# Patient Record
Sex: Female | Born: 1970
Health system: Southern US, Community
[De-identification: ages and names within clinical notes are randomized; demographics above are authoritative.]

## PROBLEM LIST (undated history)

## (undated) DIAGNOSIS — F319 Bipolar disorder, unspecified: Secondary | ICD-10-CM

## (undated) DIAGNOSIS — F329 Major depressive disorder, single episode, unspecified: Secondary | ICD-10-CM

## (undated) DIAGNOSIS — K5792 Diverticulitis of intestine, part unspecified, without perforation or abscess without bleeding: Secondary | ICD-10-CM

## (undated) DIAGNOSIS — F32A Depression, unspecified: Secondary | ICD-10-CM

## (undated) HISTORY — DX: Major depressive disorder, single episode, unspecified: F32.9

## (undated) HISTORY — DX: Bipolar disorder, unspecified: F31.9

## (undated) HISTORY — PX: TONSILLECTOMY AND ADENOIDECTOMY: SUR1326

## (undated) HISTORY — DX: Depression, unspecified: F32.A

---

## 2003-05-03 ENCOUNTER — Other Ambulatory Visit: Admission: RE | Admit: 2003-05-03 | Discharge: 2003-05-03 | Payer: Self-pay | Admitting: Family Medicine

## 2003-08-14 ENCOUNTER — Ambulatory Visit (HOSPITAL_COMMUNITY): Admission: RE | Admit: 2003-08-14 | Discharge: 2003-08-14 | Payer: Self-pay | Admitting: Internal Medicine

## 2004-10-20 ENCOUNTER — Ambulatory Visit: Payer: Self-pay | Admitting: Gastroenterology

## 2005-06-04 ENCOUNTER — Ambulatory Visit: Payer: Self-pay | Admitting: Internal Medicine

## 2010-12-27 ENCOUNTER — Encounter
Admission: RE | Admit: 2010-12-27 | Discharge: 2010-12-27 | Payer: Self-pay | Source: Home / Self Care | Attending: Family Medicine | Admitting: Family Medicine

## 2010-12-27 ENCOUNTER — Encounter: Admission: RE | Admit: 2010-12-27 | Payer: Self-pay | Source: Home / Self Care | Admitting: Family Medicine

## 2011-01-03 ENCOUNTER — Encounter: Payer: Self-pay | Admitting: Family Medicine

## 2011-04-30 NOTE — Consult Note (Signed)
NAME:  Rachael Allen, Rachael Allen NO.:  [id number]   MEDICAL RECORD NO.:  [id number]                  PATIENT TYPE:   LOCATION:                                       FACILITY:  1971-11[facility]   PHYSICIAN:  Lionel December, M.D.                 DATE OF BIRTH:   DATE OF CONSULTATION:  07/24/2003  DATE OF DISCHARGE:                                   CONSULTATION   PHYSICIAN REQUESTING CONSULTATION:  Dr. Montey Hora.   REASON FOR CONSULTATION:  Rectal bleeding.   HISTORY OF PRESENT ILLNESS:  Rachael Allen is a 40 year old Caucasian female who has  a history of intermittent hematochezia.  She has had symptoms intermittently  since January 2004.  Tend to occur every couple of months.  She notes bright  red blood per rectum.  She also had this at the end of her pregnancy a year  and a half ago and it was felt it may be due hemorrhoids.  Two or three  weeks ago, however, she developed heavy bright red blood per rectum.  Denies  any blood clots or melena.  Bowels move regularly.  Denies any rectal pain.  She has some epigastric pressure and indigestion intermittently.  She takes  Prevacid on a p.r.n. basis.  Denies any dysphagia, odynophagia, weight loss.  Her mother has a history of colonic polyps diagnosed in her mid-40s.  No  family history of colorectal cancer to her knowledge.   CURRENT MEDICATIONS:  1. Multivitamin every day.  2. Calcium 1,000 mg every day.  3. B-6 100 mg every day.  4. Vitamin E 400 I. U. every day.   ALLERGIES:  1. LATEX.  2. NSAIDs.   PAST MEDICAL HISTORY:  Polycystic ovarian syndrome, diagnosed in 1999.   PAST SURGICAL HISTORY:  1. Cesarean section.  2. Foot surgery.  3. Ganglion cyst removal, twice.  4. Left knee arthroscopy.  5. Perirectal abscess, 1994.  6. Bilateral bone spurs.   FAMILY HISTORY:  Mother has a history of polyps diagnosed in her mid-40s.  She is a patient of Dr. Jena Gauss.   SOCIAL HISTORY:  Married for three years.  Has  one child.  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  She smoked as a  teenager.  Denies any current tobacco use or alcohol use.   REVIEW OF SYSTEMS:  Please see HPI for GI.   PHYSICAL EXAMINATION:  GENERAL:  A pleasant, well nourished, well developed  Caucasian female in no acute distress.  SKIN:  Warm and dry.  No jaundice.  HEENT:  Conjunctivae are pink.  Sclerae are nonicteric.  Oropharyngeal  mucosa is moist and pink.  No lesions, erythema or exudate.  No  lymphadenopathy, thyromegaly.  CHEST:  Lungs are clear to auscultation.  CARDIAC:  Reveals regular, rate and rhythm.  Normal S1, S2.  No murmurs,  rubs or gallops.  ABDOMEN:  Positive bowel sounds.  Soft, nontender, nondistended.  No  organomegaly or masses.  EXTREMITIES:  No edema.   IMPRESSION:  Rachael Allen is a 40 year old lady with a history of chronic  intermittent hematochezia.  This may be due to a benign anorectal source  such as hemorrhoids.  Given a recent episode of moderate to heavy bright red  blood per rectum she would like to proceed with a colonoscopy which I feel  is reasonable.  In addition, she has intermittent typical reflux symptoms  which respond to Prevacid p.r.n.   PLAN:  1. Colonoscopy in near future.  2. Prevacid 30 mg p.o. every day p.r.n., #20 samples provided.  3. Further recommendations to follow.     Tana Coast, P.A.                        Lionel December, M.D.    LL/MEDQ  D:  07/24/2003  T:  07/24/2003  Job:  [id number]   cc:   Magnus Sinning. Dimple Casey, M.D.  [address] [place]  [place] [address]  Fax: [phone]

## 2011-04-30 NOTE — Op Note (Signed)
NAME:  Rachael Allen, Rachael Allen                         ACCOUNT NO.:  [id number]   MEDICAL RECORD NO.:  [id number]                   PATIENT TYPE:  AMB   LOCATION:  DAY                                  FACILITY:  [facility]   PHYSICIAN:  Lionel December, M.D.                 DATE OF BIRTH:  1971-05-08   DATE OF PROCEDURE:  08/14/2003  DATE OF DISCHARGE:                                 OPERATIVE REPORT   PROCEDURE:  Total colonoscopy.   INDICATIONS:  Rachael Allen is a 40 year old Caucasian female who has had  intermittent hematochezia felt to be secondary to hemorrhoids, but a few  weeks ago she had an episode where she had spontaneous passage of bright red  blood per rectum described to be moderate to large.  She is therefore  undergoing a diagnostic colonoscopy.  She denies diarrhea, rectal bleeding,  or abdominal pain but does get intermittent bloating.  She does not take any  NSAIDs on a regular basis.   The procedure risks were reviewed with the patient and informed consent was  obtained.   PREMEDICATION:  Demerol 25 mg IV, Versed 5 mg IV in divided dose.   FINDINGS:  Procedure performed in endoscopy suite.  The patient's vital  signs and O2 saturation were monitored during the procedure and remained  stable.  The patient was placed in the left lateral recumbent position and  rectal examination performed.  No abnormality noted on external or digital  exam.  Olympus video scope was placed in the rectum and advanced under  vision to the sigmoid colon and beyond.  Preparation was excellent.  The  scope was passed into cecum, which was identified by ileocecal valve and  appendiceal orifice.  Pictures taken for the record.  A short segment of TI  was also examined.  It had areas where there was loss of villi.  Pictures  taken for the record followed by biopsy for routine histology.  As the scope  was withdrawn, colonic mucosa was once again carefully examined and was  normal throughout.  Rectal mucosa  similarly was normal.  The scope was  retroflexed to examine the anorectal junction, and small hemorrhoids were  noted below the dentate line.  The endoscope was straightened and withdrawn.  The patient tolerated the procedure well.   FINAL DIAGNOSIS:  1. Normal colonoscopy except small external hemorrhoids, possibly source of     her rectal bleeding.  2. Abnormal patch of terminal ileal mucosa with loss of villi.  This is     possibly insignificant, biopsy taken to make sure she does not have     granulomas, etc.   RECOMMENDATIONS:  1. She will resume her usual diet.  2.     I will be contacting the patient with biopsy results.  3. I would like for her to keep a written record as to the frequency of  bleeding episodes and if this is too frequent, we may consider flexible     sigmoidoscopy immediately following one of these episodes.                                               Lionel December, M.D.    NR/MEDQ  D:  08/14/2003  T:  08/14/2003  Job:  [id number]   cc:   Magnus Sinning. Dimple Casey, M.D.  [address] [place]  [place] [address]  Fax: [phone]

## 2013-01-08 DIAGNOSIS — R0789 Other chest pain: Secondary | ICD-10-CM | POA: Insufficient documentation

## 2013-01-08 DIAGNOSIS — N6019 Diffuse cystic mastopathy of unspecified breast: Secondary | ICD-10-CM | POA: Insufficient documentation

## 2013-03-07 ENCOUNTER — Ambulatory Visit (INDEPENDENT_AMBULATORY_CARE_PROVIDER_SITE_OTHER): Payer: Managed Care, Other (non HMO) | Admitting: Nurse Practitioner

## 2013-03-07 ENCOUNTER — Encounter: Payer: Self-pay | Admitting: Nurse Practitioner

## 2013-03-07 ENCOUNTER — Encounter: Payer: Self-pay | Admitting: *Deleted

## 2013-03-07 VITALS — BP 135/96 | HR 98 | Temp 98.2°F | Ht 65.0 in | Wt 263.0 lb

## 2013-03-07 DIAGNOSIS — E282 Polycystic ovarian syndrome: Secondary | ICD-10-CM | POA: Insufficient documentation

## 2013-03-07 DIAGNOSIS — IMO0001 Reserved for inherently not codable concepts without codable children: Secondary | ICD-10-CM

## 2013-03-07 DIAGNOSIS — M797 Fibromyalgia: Secondary | ICD-10-CM

## 2013-03-07 DIAGNOSIS — Z01419 Encounter for gynecological examination (general) (routine) without abnormal findings: Secondary | ICD-10-CM

## 2013-03-07 LAB — CBC WITH DIFFERENTIAL/PLATELET
Basophils Absolute: 0 10*3/uL (ref 0.0–0.1)
Basophils Relative: 1 % (ref 0–1)
HCT: 39.9 % (ref 36.0–46.0)
Lymphocytes Relative: 27 % (ref 12–46)
MCHC: 35.1 g/dL (ref 30.0–36.0)
MCV: 81.1 fL (ref 78.0–100.0)
Neutro Abs: 4 10*3/uL (ref 1.7–7.7)
Neutrophils Relative %: 62 % (ref 43–77)
Platelets: 274 10*3/uL (ref 150–400)

## 2013-03-07 LAB — COMPLETE METABOLIC PANEL WITH GFR
AST: 15 U/L (ref 0–37)
Albumin: 4.2 g/dL (ref 3.5–5.2)
Alkaline Phosphatase: 52 U/L (ref 39–117)
BUN: 10 mg/dL (ref 6–23)
CO2: 26 mEq/L (ref 19–32)
GFR, Est African American: >89 mL/min
Glucose, Bld: 98 mg/dL (ref 70–99)
Sodium: 138 mEq/L (ref 135–145)
Total Bilirubin: 0.4 mg/dL (ref 0.3–1.2)

## 2013-03-07 LAB — POCT URINALYSIS DIPSTICK
Glucose, UA: NEGATIVE
Protein, UA: NEGATIVE
pH, UA: 6.5

## 2013-03-07 LAB — POCT UA - MICROSCOPIC ONLY
Bacteria, U Microscopic: NEGATIVE
Crystals, Ur, HPF, POC: NEGATIVE
Mucus, UA: NEGATIVE
Yeast, UA: NEGATIVE

## 2013-03-07 LAB — THYROID PANEL WITH TSH
Free Thyroxine Index: 2.8 (ref 1.0–3.9)
TSH: 0.771 u[IU]/mL (ref 0.350–4.500)

## 2013-03-07 NOTE — Progress Notes (Signed)
  Subjective:    Patient ID: Rachael Allen, female    DOB: 1971/05/09, 42 y.o.   MRN: [id number]  Gynecologic Exam The patient's primary symptoms include a genital odor, pelvic pain (cramps intermittently.) and a vaginal discharge (just feels wet all the time). This is a chronic problem. The current episode started more than 1 year ago. The problem occurs constantly. The problem has been gradually worsening. The pain is moderate. The problem affects both sides. She is not pregnant. Associated symptoms include abdominal pain (pelvic pain bil), back pain (chronic) and painful intercourse (occassionally). Pertinent negatives include no constipation, diarrhea, dysuria, flank pain or nausea. The vaginal discharge was clear and watery. The vaginal bleeding is spotting (Has Mirenia). She has not been passing clots. She has not been passing tissue. Nothing aggravates the symptoms. She has tried NSAIDs for the symptoms. The treatment provided moderate relief. She is sexually active. No, her partner does not have an STD. She uses an IUD for contraception. Menstrual history: spotty but no regular menses due to mirena  Her past medical history is significant for a Cesarean section (X2).      Review of Systems  Constitutional: Negative.   HENT: Negative.   Eyes: Negative.   Respiratory: Negative.   Cardiovascular: Negative.   Gastrointestinal: Positive for abdominal pain (pelvic pain bil). Negative for nausea, diarrhea and constipation.  Endocrine: Negative.   Genitourinary: Positive for vaginal discharge (just feels wet all the time) and pelvic pain (cramps intermittently.). Negative for dysuria and flank pain.  Musculoskeletal: Positive for back pain (chronic).  Skin: Negative.   Neurological: Negative.   Hematological: Negative.   Psychiatric/Behavioral: Negative.        Objective:   Physical Exam  Constitutional: She is oriented to person, place, and time. She appears well-developed and  well-nourished.  HENT:  Head: Normocephalic.  Nose: Nose normal.  Mouth/Throat: Oropharynx is clear and moist.  Eyes: Conjunctivae and EOM are normal. Pupils are equal, round, and reactive to light.  Neck: Normal range of motion. Neck supple. No JVD present. Carotid bruit is not present.  Cardiovascular: Normal rate, regular rhythm, normal heart sounds and intact distal pulses.  Exam reveals no gallop and no friction rub.   No murmur heard. Pulmonary/Chest: Effort normal. She has no wheezes. She has rales.  Abdominal: Soft. Bowel sounds are normal. She exhibits no mass. There is no tenderness. There is no rebound.  Genitourinary: Vagina normal and uterus normal.  Cervix nonparous and pink. No discharge noted.Mirenia string visible  Musculoskeletal: Normal range of motion.  All over pain from fibromyalgia  Neurological: She is alert and oriented to person, place, and time. She has normal reflexes.  Skin: Skin is warm and dry.  Psychiatric: She has a normal mood and affect. Her behavior is normal. Judgment and thought content normal.   BP 135/96  Pulse 98  Temp(Src) 98.2 F (36.8 C) (Oral)  Ht 5\' 5"  (1.651 m)  Wt 263 lb (119.296 kg)  BMI 43.77 kg/m2        Assessment & Plan:  CPE Labs pending  Pap pending Watch diet  Encourage exercise  Mary-Margaret Daphine Deutscher, FNP

## 2013-03-07 NOTE — Patient Instructions (Signed)

## 2013-03-08 LAB — PAP IG W/ RFLX HPV ASCU

## 2013-03-09 ENCOUNTER — Telehealth: Payer: Self-pay | Admitting: Nurse Practitioner

## 2013-03-09 LAB — NMR LIPOPROFILE WITH LIPIDS
Cholesterol, Total: 150 mg/dL (ref <200)
HDL Particle Number: 26.6 umol/L — ABNORMAL LOW (ref >=30.5)
HDL-C: 35 mg/dL — ABNORMAL LOW (ref >=40)
LDL (calc): 82 mg/dL (ref <100)
LDL Particle Number: 1140 nmol/L — ABNORMAL HIGH (ref <1000)
LDL Size: 20.6 nm (ref >20.5)
Large HDL-P: 1.8 umol/L — ABNORMAL LOW (ref >=4.8)
Triglycerides: 167 mg/dL — ABNORMAL HIGH (ref <150)

## 2013-03-09 NOTE — Telephone Encounter (Signed)
Patient aware of labs.  

## 2013-03-09 NOTE — Progress Notes (Signed)
Patient aware of labs.  

## 2013-03-15 ENCOUNTER — Telehealth: Payer: Self-pay | Admitting: Nurse Practitioner

## 2013-03-27 ENCOUNTER — Telehealth: Payer: Self-pay | Admitting: Nurse Practitioner

## 2013-03-27 ENCOUNTER — Other Ambulatory Visit: Payer: Self-pay | Admitting: Nurse Practitioner

## 2013-03-27 DIAGNOSIS — M797 Fibromyalgia: Secondary | ICD-10-CM

## 2013-03-27 NOTE — Telephone Encounter (Signed)
Needs pain clinic referral. Where would like to go

## 2013-03-27 NOTE — Telephone Encounter (Signed)
Patient called and canceled appoint said she would call back and make appointment

## 2013-03-27 NOTE — Telephone Encounter (Signed)
Patient does not care where she goes

## 2013-03-27 NOTE — Telephone Encounter (Signed)
Patient states that she is still having pain. The cymbalta that you put her on helps but she wants to know if she needs to come back and see you or her RA doctor? Please advise

## 2013-03-28 NOTE — Progress Notes (Signed)
Patient aware.

## 2013-04-03 ENCOUNTER — Telehealth: Payer: Self-pay | Admitting: Nurse Practitioner

## 2013-04-03 NOTE — Telephone Encounter (Signed)
I need to know exactly what letter needs to say. WH at is she going to court for.

## 2013-04-04 ENCOUNTER — Telehealth: Payer: Self-pay | Admitting: *Deleted

## 2013-04-04 DIAGNOSIS — E282 Polycystic ovarian syndrome: Secondary | ICD-10-CM

## 2013-04-04 NOTE — Telephone Encounter (Signed)
Referral made 

## 2013-04-04 NOTE — Telephone Encounter (Signed)
Pt aware and appt made.

## 2013-04-04 NOTE — Telephone Encounter (Signed)
Pt aware.

## 2013-04-04 NOTE — Telephone Encounter (Signed)
Can we please referral to endo for PCOS.

## 2013-04-04 NOTE — Telephone Encounter (Signed)
States she has to go to a social security hearing where she has been out of work Clinical research associate said she needs a doctors note saying what all has been going on with her for her mental and physical issue of why she has not been able to go to work so she can get disability

## 2013-04-04 NOTE — Telephone Encounter (Signed)
Patient will NT[employer] to get letter so we can make sure it is correct.

## 2013-04-10 ENCOUNTER — Encounter: Payer: Self-pay | Admitting: Physical Medicine & Rehabilitation

## 2013-04-12 ENCOUNTER — Telehealth: Payer: Self-pay | Admitting: Nurse Practitioner

## 2013-04-12 NOTE — Telephone Encounter (Signed)
Please advise 

## 2013-04-12 NOTE — Telephone Encounter (Signed)
What does she want sheis allergic to Vicodin.

## 2013-04-13 NOTE — Telephone Encounter (Signed)
lmtcb alf 04/13/13

## 2013-04-13 NOTE — Telephone Encounter (Signed)
Tell patient ot come in early Saturday morning and we can give her a shot

## 2013-04-13 NOTE — Telephone Encounter (Signed)
Patient reports a headache x 4 days.  Has also developed some pain in her neck.  Has hx of migraines but headaches don't typically last this long.  They have not been increasing in intensity but have not improved either.  She doesn't have a medication preference for treatment and uses [facility].    She is aware of our Saturday clinic hours and that we have someone available on call at night and on the weekend.  She will f/u with Paulene Floor, FNP next week if pain hasn't resolved or worsens.

## 2013-04-19 ENCOUNTER — Ambulatory Visit (INDEPENDENT_AMBULATORY_CARE_PROVIDER_SITE_OTHER): Payer: Managed Care, Other (non HMO) | Admitting: Nurse Practitioner

## 2013-04-19 ENCOUNTER — Encounter: Payer: Self-pay | Admitting: Nurse Practitioner

## 2013-04-19 VITALS — BP 112/76 | HR 70 | Temp 97.5°F | Ht 65.5 in

## 2013-04-19 DIAGNOSIS — G43909 Migraine, unspecified, not intractable, without status migrainosus: Secondary | ICD-10-CM

## 2013-04-19 DIAGNOSIS — R42 Dizziness and giddiness: Secondary | ICD-10-CM

## 2013-04-19 MED ORDER — BUTALBITAL-APAP-CAFF-COD 50-300-40-30 MG PO CAPS: 1.0000 | ORAL_CAPSULE | ORAL | Status: DC | PRN

## 2013-04-19 MED ORDER — MECLIZINE HCL 25 MG PO TABS: 25.0000 mg | ORAL_TABLET | Freq: Three times a day (TID) | ORAL | Status: DC | PRN

## 2013-04-19 NOTE — Patient Instructions (Signed)
Migraine Headache A migraine headache is an intense, throbbing pain on one or both sides of your head. A migraine can last for 30 minutes to several hours. CAUSES  The exact cause of a migraine headache is not always known. However, a migraine may be caused when nerves in the brain become irritated and release chemicals that cause inflammation. This causes pain. SYMPTOMS  Pain on one or both sides of your head.  Pulsating or throbbing pain.  Severe pain that prevents daily activities.  Pain that is aggravated by any physical activity.  Nausea, vomiting, or both.  Dizziness.  Pain with exposure to bright lights, loud noises, or activity.  General sensitivity to bright lights, loud noises, or smells. Before you get a migraine, you may get warning signs that a migraine is coming (aura). An aura may include:  Seeing flashing lights.  Seeing bright spots, halos, or zig-zag lines.  Having tunnel vision or blurred vision.  Having feelings of numbness or tingling.  Having trouble talking.  Having muscle weakness. MIGRAINE TRIGGERS  Alcohol.  Smoking.  Stress.  Menstruation.  Aged cheeses.  Foods or drinks that contain nitrates, glutamate, aspartame, or tyramine.  Lack of sleep.  Chocolate.  Caffeine.  Hunger.  Physical exertion.  Fatigue.  Medicines used to treat chest pain (nitroglycerine), birth control pills, estrogen, and some blood pressure medicines. DIAGNOSIS  A migraine headache is often diagnosed based on:  Symptoms.  Physical examination.  A CT scan or MRI of your head. TREATMENT Medicines may be given for pain and nausea. Medicines can also be given to help prevent recurrent migraines.  HOME CARE INSTRUCTIONS  Only take over-the-counter or prescription medicines for pain or discomfort as directed by your caregiver. The use of long-term narcotics is not recommended.  Lie down in a dark, quiet room when you have a migraine.  Keep a journal  to find out what may trigger your migraine headaches. For example, write down:  What you eat and drink.  How much sleep you get.  Any change to your diet or medicines.  Limit alcohol consumption.  Quit smoking if you smoke.  Get 7 to 9 hours of sleep, or as recommended by your caregiver.  Limit stress.  Keep lights dim if bright lights bother you and make your migraines worse. SEEK IMMEDIATE MEDICAL CARE IF:   Your migraine becomes severe.  You have a fever.  You have a stiff neck.  You have vision loss.  You have muscular weakness or loss of muscle control.  You start losing your balance or have trouble walking.  You feel faint or pass out.  You have severe symptoms that are different from your first symptoms. MAKE SURE YOU:   Understand these instructions.  Will watch your condition.  Will get help right away if you are not doing well or get worse. Document Released: 11/29/2005 Document Revised: 02/21/2012 Document Reviewed: 11/19/2011 ExitCare Patient Information 2013 [employer].  

## 2013-04-19 NOTE — Progress Notes (Signed)
  Subjective:    Patient ID: Rachael Allen, female    DOB: [birth year], 42 y.o.   MRN: [id number]  HPI- Patient in C/O migraine that lasted all last week. Finally has resolved now. Nothing seemed to help it. Now having a little dizziness. Occurs when rolling over in bed and sometimes when standing, but only last maybe 10-15 seconds. It does occur several times a day.    Review of Systems  Gastrointestinal: Positive for nausea.  Neurological: Positive for dizziness, numbness and headaches.       Objective:   Physical Exam  Constitutional: She is oriented to person, place, and time. She appears well-developed and well-nourished.  Cardiovascular: Normal rate and normal heart sounds.   Pulmonary/Chest: Effort normal and breath sounds normal.  Neurological: She is alert and oriented to person, place, and time. She has normal reflexes. No cranial nerve deficit.  BP 112/76  Pulse 70  Temp(Src) 97.5 F (36.4 C) (Oral)  Ht 5' 5.5" (1.664 m)         Assessment & Plan:  1. Migraines Rest in a dark room COOL COMPRESSES TO HEAD - Butalbital-APAP-Caff-Cod (FIORICET/CODEINE) 50-300-40-30 MG CAPS; Take 1 capsule by mouth as needed.  Dispense: 30 capsule; Refill: 1  2. Dizziness force fluids - meclizine (ANTIVERT) 25 MG tablet; Take 1 tablet (25 mg total) by mouth 3 (three) times daily as needed.  Dispense: 30 tablet; Refill: 0   Mary-Margaret Daphine Deutscher, FNP

## 2013-04-27 ENCOUNTER — Encounter: Payer: Medicare HMO | Admitting: Physical Medicine & Rehabilitation

## 2013-05-02 ENCOUNTER — Ambulatory Visit (INDEPENDENT_AMBULATORY_CARE_PROVIDER_SITE_OTHER): Payer: Managed Care, Other (non HMO) | Admitting: Nurse Practitioner

## 2013-05-02 ENCOUNTER — Encounter: Payer: Self-pay | Admitting: Nurse Practitioner

## 2013-05-02 VITALS — BP 123/75 | HR 68 | Temp 98.2°F | Ht 64.5 in | Wt 276.0 lb

## 2013-05-02 DIAGNOSIS — J029 Acute pharyngitis, unspecified: Secondary | ICD-10-CM

## 2013-05-02 NOTE — Patient Instructions (Signed)
Pain of Unknown Etiology (Pain Without a Known Cause) °You have come to your caregiver because of pain. Pain can occur in any part of the body. Often there is not a definite cause. If your laboratory (blood or urine) work was normal and x-rays or other studies were normal, your caregiver may treat you without knowing the cause of the pain. An example of this is the headache. Most headaches are diagnosed by taking a history. This means your caregiver asks you questions about your headaches. Your caregiver determines a treatment based on your answers. Usually testing done for headaches is normal. Often testing is not done unless there is no response to medications. Regardless of where your pain is located today, you can be given medications to make you comfortable. If no physical cause of pain can be found, most cases of pain will gradually leave as suddenly as they came.  °If you have a painful condition and no reason can be found for the pain, It is importantthat you follow up with your caregiver. If the pain becomes worse or does not go away, it may be necessary to repeat tests and look further for a possible cause. °· Only take over-the-counter or prescription medicines for pain, discomfort, or fever as directed by your caregiver. °· For the protection of your privacy, test results can not be given over the phone. Make sure you receive the results of your test. Ask as to how these results are to be obtained if you have not been informed. It is your responsibility to obtain your test results. °· You may continue all activities unless the activities cause more pain. When the pain lessens, it is important to gradually resume normal activities. Resume activities by beginning slowly and gradually increasing the intensity and duration of the activities or exercise. During periods of severe pain, bed-rest may be helpful. Lay or sit in any position that is comfortable. °· Ice used for acute (sudden) conditions may be  effective. Use a large plastic bag filled with ice and wrapped in a towel. This may provide pain relief. °· See your caregiver for continued problems. They can help or refer you for exercises or physical therapy if necessary. °If you were given medications for your condition, do not drive, operate machinery or power tools, or sign legal documents for 24 hours. Do not drink alcohol, take sleeping pills, or take other medications that may interfere with treatment. °See your caregiver immediately if you have pain that is becoming worse and not relieved by medications. °Document Released: 08/24/2001 Document Revised: 02/21/2012 Document Reviewed: 11/29/2005 °ExitCare® Patient Information ©2014 [employer]. ° °

## 2013-05-02 NOTE — Progress Notes (Signed)
  Subjective:    Patient ID: Rachael Allen, female    DOB: [birth year], 42 y.o.   MRN: [id number]   HPI Patient in today to discuss infromation for letter that needs written for disability- Patient was diagnosed with Fibromyalgia and PCOS. Her functional capacity is as follows: -Can't stand for more than 30 minutes ata time -Walking causes her feet to cramp and feet go numb -Cant sit for long periods of time because she gets stiff - Can't lay for long periods of time- gets up at least 3-4 X a night -Hands going numb when she does any tasks -Hands and feet swell almost everyday -memory problems -patient has to write everything down -Depression due to constant pain and feeling bad all the time -unable to play with her kids  * C/O congestion and sore throat today  Review of Systems  Constitutional: Positive for appetite change and fatigue. Negative for fever and chills.  HENT: Positive for congestion, sore throat and rhinorrhea. Negative for ear pain.   Respiratory: Negative for chest tightness.   Cardiovascular: Negative for chest pain, palpitations and leg swelling.  Psychiatric/Behavioral: Positive for dysphoric mood and agitation.       Objective:   Physical Exam  Constitutional: She is oriented to person, place, and time. She appears well-developed and well-nourished.  HENT:  Right Ear: Hearing, tympanic membrane, external ear and ear canal normal.  Left Ear: Hearing, tympanic membrane, external ear and ear canal normal.  Nose: Mucosal edema and rhinorrhea present. Right sinus exhibits no maxillary sinus tenderness and no frontal sinus tenderness. Left sinus exhibits no maxillary sinus tenderness and no frontal sinus tenderness.  Mouth/Throat: Posterior oropharyngeal erythema present.  Eyes: EOM are normal. Pupils are equal, round, and reactive to light.  Neck: Normal range of motion.  Cardiovascular: Normal rate and normal heart sounds.   Pulmonary/Chest: Effort normal and  breath sounds normal.  Lymphadenopathy:    She has no cervical adenopathy.  Neurological: She is alert and oriented to person, place, and time.  Skin: Skin is warm.  Psychiatric: She has a normal mood and affect. Her behavior is normal. Judgment and thought content normal.  Tearful during exam   BP 123/75  Pulse 68  Temp(Src) 98.2 F (36.8 C) (Oral)  Ht 5' 4.5" (1.638 m)  Wt 276 lb (125.193 kg)  BMI 46.66 kg/m2 Results for orders placed in visit on 05/02/13  POCT RAPID STREP A (OFFICE)      Result Value Range   Rapid Strep A Screen Negative  Negative          Assessment & Plan:  1. Sore throat Force fluids OTC decongestant Motrin or tylenol OTC prn - POCT rapid strep A  Fibromyalgia/PCOS/Bipolar/Depression  gathered information for disability letter that needs to be written Will call patient when letter is ready  Mary-Margaret Daphine Deutscher, FNP

## 2013-05-08 DIAGNOSIS — R0602 Shortness of breath: Secondary | ICD-10-CM | POA: Insufficient documentation

## 2013-05-10 ENCOUNTER — Telehealth: Payer: Self-pay | Admitting: Nurse Practitioner

## 2013-05-10 NOTE — Telephone Encounter (Signed)
Pull chart for me please- My notes are in chart- thensend message back to remind me to do note

## 2013-05-11 ENCOUNTER — Encounter: Payer: Self-pay | Admitting: Nurse Practitioner

## 2013-05-11 NOTE — Telephone Encounter (Signed)
Letter ready for pick up

## 2013-05-11 NOTE — Telephone Encounter (Signed)
Patient aware.

## 2013-05-11 NOTE — Telephone Encounter (Signed)
Pulling chart and will put on your desk

## 2013-09-12 ENCOUNTER — Telehealth: Payer: Self-pay | Admitting: *Deleted

## 2013-09-12 NOTE — Telephone Encounter (Signed)
PT ASKED FOR ADDRESS TO NERVE STUDY TESTING. PT CALLED [phone]. I CALLED PT 1154AM, [facility] [address], [place] PHONE [phone].

## 2013-10-10 ENCOUNTER — Other Ambulatory Visit: Payer: Self-pay | Admitting: Nurse Practitioner

## 2013-10-11 NOTE — Telephone Encounter (Signed)
Last seen 05/02/13  MMM   

## 2013-10-11 NOTE — Telephone Encounter (Signed)
Last seen 05/08/13  MMM

## 2013-11-20 ENCOUNTER — Ambulatory Visit: Payer: Managed Care, Other (non HMO) | Admitting: Family Medicine

## 2013-11-21 ENCOUNTER — Ambulatory Visit (INDEPENDENT_AMBULATORY_CARE_PROVIDER_SITE_OTHER): Payer: Managed Care, Other (non HMO) | Admitting: Family Medicine

## 2013-11-21 ENCOUNTER — Encounter: Payer: Self-pay | Admitting: Family Medicine

## 2013-11-21 ENCOUNTER — Encounter (INDEPENDENT_AMBULATORY_CARE_PROVIDER_SITE_OTHER): Payer: Self-pay

## 2013-11-21 VITALS — BP 138/77 | HR 61 | Temp 98.5°F | Ht 64.5 in | Wt 277.0 lb

## 2013-11-21 DIAGNOSIS — R062 Wheezing: Secondary | ICD-10-CM

## 2013-11-21 DIAGNOSIS — J069 Acute upper respiratory infection, unspecified: Secondary | ICD-10-CM

## 2013-11-21 DIAGNOSIS — J40 Bronchitis, not specified as acute or chronic: Secondary | ICD-10-CM

## 2013-11-21 MED ORDER — BENZONATATE 100 MG PO CAPS: 100.0000 mg | ORAL_CAPSULE | Freq: Three times a day (TID) | ORAL | Status: DC | PRN

## 2013-11-21 MED ORDER — METHYLPREDNISOLONE ACETATE 40 MG/ML IJ SUSP: 80.0000 mg | Freq: Once | INTRAMUSCULAR | Status: DC

## 2013-11-21 MED ORDER — AZITHROMYCIN 250 MG PO TABS: ORAL_TABLET | ORAL | Status: DC

## 2013-11-21 MED ORDER — METHYLPREDNISOLONE ACETATE 80 MG/ML IJ SUSP
80.0000 mg | Freq: Once | INTRAMUSCULAR | Status: AC
  Administered 2013-11-21: 80 mg via INTRAMUSCULAR

## 2013-11-21 NOTE — Patient Instructions (Signed)

## 2013-11-21 NOTE — Progress Notes (Signed)
   Subjective:    Patient ID: Rachael Allen, female    DOB: [birth year], 42 y.o.   MRN: [id number]  HPI  URI Symptoms Onset: 2 weeks  Description: cough, mild wheezing, rhinorrhea  Modifying factors:  Prior inhaler use, though no formal dx of asthma   Symptoms Nasal discharge: yes Fever: no Sore throat: no Cough: yes Wheezing: yes Ear pain: no GI symptoms: no Sick contacts: yes  Red Flags  Stiff neck: no Dyspnea: no Rash: no Swallowing difficulty: no  Sinusitis Risk Factors Headache/face pain: no Double sickening: no tooth pain: no  Allergy Risk Factors Sneezing: no Itchy scratchy throat: no Seasonal symptoms: no  Flu Risk Factors Headache: no muscle aches: no severe fatigue: no     Review of Systems  All other systems reviewed and are negative.       Objective:   Physical Exam  Constitutional: She is oriented to person, place, and time.  Obese    HENT:  Head: Normocephalic and atraumatic.  Right Ear: External ear normal.  Left Ear: External ear normal.  +nasal erythema, rhinorrhea bilaterally, + post oropharyngeal erythema    Eyes: Conjunctivae are normal. Pupils are equal, round, and reactive to light.  Neck: Normal range of motion.  Cardiovascular: Normal rate and regular rhythm.   Pulmonary/Chest: Effort normal and breath sounds normal.  Faint wheezes in apices    Abdominal: Soft.  Musculoskeletal: Normal range of motion.  Neurological: She is alert and oriented to person, place, and time.  Skin: Skin is warm.          Assessment & Plan:  Bronchitis - Plan: methylPREDNISolone acetate (DEPO-MEDROL) injection 80 mg, azithromycin (ZITHROMAX) 250 MG tablet, benzonatate (TESSALON) 100 MG capsule  Suspect likely viral/post viral source of sxs.  Depomedrol 80mg  IM x1 given wheezing  zpak for atpyical coverage given duration of sxs.  Tessalon perles for cough  Discussed general and infectious/resp red flags.  Follow up as needed.

## 2013-11-21 NOTE — Addendum Note (Signed)
Addended by: Gwenith Daily on: 11/21/2013 01:49 PM   Modules accepted: Orders

## 2013-11-28 ENCOUNTER — Other Ambulatory Visit: Payer: Self-pay

## 2013-11-28 MED ORDER — FLUCONAZOLE 150 MG PO TABS: 150.0000 mg | ORAL_TABLET | Freq: Once | ORAL | Status: DC

## 2013-11-28 NOTE — Telephone Encounter (Signed)
Last seen 11/21/13  Dr Alvester Morin

## 2014-02-28 ENCOUNTER — Telehealth: Payer: Self-pay | Admitting: *Deleted

## 2014-02-28 NOTE — Telephone Encounter (Signed)
I saw Dr. Milinda Pointer back in June.  I need the number to [facility] where he sent me for nerve study.  Leave the number on my voicemail.  I returned her call and gave her the phone number.  [phone] [facility]

## 2014-04-04 DIAGNOSIS — Z975 Presence of (intrauterine) contraceptive device: Secondary | ICD-10-CM | POA: Insufficient documentation

## 2014-04-11 ENCOUNTER — Encounter: Payer: Self-pay | Admitting: *Deleted

## 2014-05-09 ENCOUNTER — Ambulatory Visit (INDEPENDENT_AMBULATORY_CARE_PROVIDER_SITE_OTHER): Payer: BC Managed Care – PPO | Admitting: Nurse Practitioner

## 2014-05-09 ENCOUNTER — Encounter: Payer: Self-pay | Admitting: Nurse Practitioner

## 2014-05-09 VITALS — BP 125/84 | HR 64 | Temp 98.6°F | Ht 64.0 in | Wt 278.0 lb

## 2014-05-09 DIAGNOSIS — IMO0001 Reserved for inherently not codable concepts without codable children: Secondary | ICD-10-CM

## 2014-05-09 DIAGNOSIS — Z713 Dietary counseling and surveillance: Secondary | ICD-10-CM

## 2014-05-09 DIAGNOSIS — F32A Depression, unspecified: Secondary | ICD-10-CM

## 2014-05-09 DIAGNOSIS — F329 Major depressive disorder, single episode, unspecified: Secondary | ICD-10-CM | POA: Insufficient documentation

## 2014-05-09 DIAGNOSIS — G47419 Narcolepsy without cataplexy: Secondary | ICD-10-CM

## 2014-05-09 DIAGNOSIS — E282 Polycystic ovarian syndrome: Secondary | ICD-10-CM

## 2014-05-09 DIAGNOSIS — Z6841 Body Mass Index (BMI) 40.0 and over, adult: Secondary | ICD-10-CM

## 2014-05-09 DIAGNOSIS — F3289 Other specified depressive episodes: Secondary | ICD-10-CM

## 2014-05-09 DIAGNOSIS — F320 Major depressive disorder, single episode, mild: Secondary | ICD-10-CM | POA: Insufficient documentation

## 2014-05-09 DIAGNOSIS — G43909 Migraine, unspecified, not intractable, without status migrainosus: Secondary | ICD-10-CM

## 2014-05-09 DIAGNOSIS — M797 Fibromyalgia: Secondary | ICD-10-CM

## 2014-05-09 NOTE — Progress Notes (Signed)
   Subjective:    Patient ID: [name], female    DOB: 1971/12/05, 43 y.o.   MRN: [id number]  HPI Patient here today for follow up of chronic medical problems: -Fibromyalgia Currently not taking anything - tries to exercise- has tried cymbalta but that caused her to itch so she had to stop taking. Ha [place] daily -Depression On effexor which works well to keep emotions under control -Narcolepsy Currently on nuvigil which does well to keep her from sleeping all the time.   Review of Systems  Constitutional: Negative.   HENT: Negative.   Respiratory: Negative.   Cardiovascular: Negative.   Musculoskeletal: Positive for myalgias.  Neurological: Negative.   Psychiatric/Behavioral: Negative.   All other systems reviewed and are negative.      Objective:   Physical Exam  Constitutional: She is oriented to person, place, and time. She appears well-developed and well-nourished.  HENT:  Nose: Nose normal.  Mouth/Throat: Oropharynx is clear and moist.  Eyes: EOM are normal.  Neck: Trachea normal, normal range of motion and full passive range of motion without pain. Neck supple. No JVD present. Carotid bruit is not present. No thyromegaly present.  Cardiovascular: Normal rate, regular rhythm, normal heart sounds and intact distal pulses.  Exam reveals no gallop and no friction rub.   No murmur heard. Pulmonary/Chest: Effort normal and breath sounds normal.  Abdominal: Soft. Bowel sounds are normal. She exhibits no distension and no mass. There is no tenderness.  Musculoskeletal: Normal range of motion.  Point tenderness all up and down back  Lymphadenopathy:    She has no cervical adenopathy.  Neurological: She is alert and oriented to person, place, and time. She has normal reflexes.  Skin: Skin is warm and dry.  Psychiatric: She has a normal mood and affect. Her behavior is normal. Judgment and thought content normal.    BP 125/84  Pulse 64  Temp(Src) 98.6 F (37 C)  (Oral)  Ht $R'5\' 4"'uy$  (1.626 m)  Wt 278 lb (126.1 kg)  BMI 47.70 kg/m2       Assessment & Plan:   1. Fibromyalgia muscle pain   2. Migraines   3. PCOS (polycystic ovarian syndrome)   4. Depression   5. Narcolepsy   6. BMI 45.0-49.9, adult   7. Weight loss counseling, encounter for    Orders Placed This Encounter  Procedures  . CMP14+EGFR  . NMR, lipoprofile   Meds ordered this encounter  Medications  . pramipexole (MIRAPEX) 0.125 MG tablet    Sig: Take 0.125 mg by mouth daily.    Labs pending Health maintenance reviewed Diet and exercise encouraged- walking will help weight as well as fibromyalgia Continue all meds Follow up  In 6 months   [place], FNP

## 2014-05-09 NOTE — Patient Instructions (Signed)

## 2014-05-10 ENCOUNTER — Ambulatory Visit: Payer: Managed Care, Other (non HMO) | Admitting: Nurse Practitioner

## 2014-05-10 LAB — CMP14+EGFR
A/G RATIO: 2 (ref 1.1–2.5)
ALK PHOS: 59 IU/L (ref 39–117)
ALT: 30 IU/L (ref 0–32)
AST: 19 IU/L (ref 0–40)
Albumin: 4.3 g/dL (ref 3.5–5.5)
BILIRUBIN TOTAL: 0.3 mg/dL (ref 0.0–1.2)
BUN / CREAT RATIO: 15 (ref 9–23)
BUN: 10 mg/dL (ref 6–24)
CHLORIDE: 101 mmol/L (ref 97–108)
CO2: 25 mmol/L (ref 18–29)
CREATININE: 0.68 mg/dL (ref 0.57–1.00)
Calcium: 9.4 mg/dL (ref 8.7–10.2)
GFR calc non Af Amer: 107 mL/min/{1.73_m2} (ref >59)
GFR, EST AFRICAN AMERICAN: 124 mL/min/{1.73_m2} (ref >59)
Globulin, Total: 2.2 g/dL (ref 1.5–4.5)
Glucose: 105 mg/dL — ABNORMAL HIGH (ref 65–99)
Potassium: 4.4 mmol/L (ref 3.5–5.2)
Sodium: 141 mmol/L (ref 134–144)
Total Protein: 6.5 g/dL (ref 6.0–8.5)

## 2014-05-10 LAB — NMR, LIPOPROFILE
Cholesterol: 163 mg/dL (ref 100–199)
HDL Cholesterol by NMR: 32 mg/dL — ABNORMAL LOW (ref >39)
HDL PARTICLE NUMBER: 28.9 umol/L — AB (ref >=30.5)
LDL Particle Number: 1378 nmol/L — ABNORMAL HIGH (ref <1000)
LDL SIZE: 20.2 nm (ref >20.5)
LDLC SERPL CALC-MCNC: 96 mg/dL (ref 0–99)
LP-IR SCORE: 89 — AB (ref <=45)
Small LDL Particle Number: 736 nmol/L — ABNORMAL HIGH (ref <=527)
Triglycerides by NMR: 177 mg/dL — ABNORMAL HIGH (ref 0–149)

## 2014-05-13 ENCOUNTER — Telehealth: Payer: Self-pay | Admitting: Family Medicine

## 2014-05-13 NOTE — Telephone Encounter (Signed)
Message copied by Waverly Ferrari on Mon May 13, 2014  3:44 PM ------      Message from: Chevis Pretty      Created: Mon May 13, 2014  2:36 PM       Kidney and liver function stable      LDL particle numbers are going up some      Strict low fat diet and exercise- recheck in 3 months ------

## 2014-05-14 NOTE — Telephone Encounter (Signed)
Patient aware.

## 2014-05-21 ENCOUNTER — Telehealth: Payer: Self-pay | Admitting: Nurse Practitioner

## 2014-05-22 NOTE — Telephone Encounter (Signed)
Patient aware that it has already been faxed she thought she was suppose to turn it in

## 2014-05-22 NOTE — Telephone Encounter (Signed)
Yes i already filled out see if is in paper chart. I thought I gave it to someone Rachael Allen) to fax

## 2014-05-22 NOTE — Telephone Encounter (Signed)
Patient said she gave this you at her appointment do you know anything about in?

## 2014-07-16 ENCOUNTER — Ambulatory Visit: Payer: BC Managed Care – PPO | Admitting: Podiatry

## 2014-07-30 ENCOUNTER — Ambulatory Visit: Payer: BC Managed Care – PPO | Admitting: Podiatry

## 2014-08-06 ENCOUNTER — Ambulatory Visit (INDEPENDENT_AMBULATORY_CARE_PROVIDER_SITE_OTHER): Payer: BC Managed Care – PPO | Admitting: Podiatry

## 2014-08-06 ENCOUNTER — Encounter: Payer: Self-pay | Admitting: Podiatry

## 2014-08-06 ENCOUNTER — Ambulatory Visit (INDEPENDENT_AMBULATORY_CARE_PROVIDER_SITE_OTHER): Payer: BC Managed Care – PPO

## 2014-08-06 VITALS — BP 127/78 | HR 66 | Resp 17

## 2014-08-06 DIAGNOSIS — R52 Pain, unspecified: Secondary | ICD-10-CM

## 2014-08-06 DIAGNOSIS — M722 Plantar fascial fibromatosis: Secondary | ICD-10-CM

## 2014-08-06 MED ORDER — MELOXICAM 15 MG PO TABS: 15.0000 mg | ORAL_TABLET | Freq: Every day | ORAL | Status: DC

## 2014-08-06 MED ORDER — METHYLPREDNISOLONE (PAK) 4 MG PO TABS: ORAL_TABLET | ORAL | Status: DC

## 2014-08-06 NOTE — Progress Notes (Signed)
   Subjective:    Patient ID: Rachael Allen, female    DOB: 1971/11/08, 43 y.o.   MRN: [id number]  HPI Pt presents with bilateral foot pain, aching/cramping both feet for about 8 or 9 months, pain is worse after being off feet and then trying to walk again. Left heel pain is worse, "feels bruised or like Im walking on rocks", this has been a worsening problem for 3 months. Pain is constant, eases up after walking and sometimes feels numb   Review of Systems  All other systems reviewed and are negative.      Objective:   Physical Exam: I have reviewed her past medical history medications allergies surgeries social history and review of systems. Pulses are strongly palpable bilateral. She has pain on palpation medial calcaneal tubercles bilateral. Radiographic evaluation demonstrates small plantar distally oriented calcaneal heel spurs with soft tissue increase in density at the plantar fascial calcaneal insertion site.        Assessment & Plan:  Assessment: Plantar fasciitis recurrence bilateral.  Plan: Discussed etiology pathology conservative versus surgical therapies. I encouraged her to start on Medrol Dosepak and to wear tennis shoes a regular basis. She will also ice and wear her night splint. I injected the bilateral heels today with Kenalog and local anesthetic no followup with her in one month

## 2014-09-03 ENCOUNTER — Ambulatory Visit: Payer: BC Managed Care – PPO | Admitting: Podiatry

## 2014-10-28 ENCOUNTER — Ambulatory Visit (INDEPENDENT_AMBULATORY_CARE_PROVIDER_SITE_OTHER): Payer: BC Managed Care – PPO | Admitting: Family Medicine

## 2014-10-28 VITALS — BP 120/82 | HR 92 | Temp 98.5°F | Ht 64.0 in | Wt 280.0 lb

## 2014-10-28 DIAGNOSIS — J206 Acute bronchitis due to rhinovirus: Secondary | ICD-10-CM

## 2014-10-28 MED ORDER — AZITHROMYCIN 250 MG PO TABS: ORAL_TABLET | ORAL | Status: DC

## 2014-10-28 MED ORDER — METHYLPREDNISOLONE (PAK) 4 MG PO TABS: ORAL_TABLET | ORAL | Status: DC

## 2014-10-28 MED ORDER — BENZONATATE 100 MG PO CAPS: 200.0000 mg | ORAL_CAPSULE | Freq: Three times a day (TID) | ORAL | Status: DC | PRN

## 2014-10-28 NOTE — Progress Notes (Signed)
   Subjective:    Patient ID: Rachael Allen, female    DOB: 1971-08-14, 43 y.o.   MRN: [id number]  HPI C/o persistent cough and uri sx's  Review of Systems  Constitutional: Negative for fever.  HENT: Negative for ear pain.   Eyes: Negative for discharge.  Respiratory: Negative for cough.   Cardiovascular: Negative for chest pain.  Gastrointestinal: Negative for abdominal distention.  Endocrine: Negative for polyuria.  Genitourinary: Negative for difficulty urinating.  Musculoskeletal: Negative for gait problem and neck pain.  Skin: Negative for color change and rash.  Neurological: Negative for speech difficulty and headaches.  Psychiatric/Behavioral: Negative for agitation.       Objective:    BP 120/82 mmHg  Pulse 92  Temp(Src) 98.5 F (36.9 C) (Oral)  Ht 5\' 4"  (1.626 m)  Wt 280 lb (127.007 kg)  BMI 48.04 kg/m2 Physical Exam  Constitutional: She is oriented to person, place, and time. She appears well-developed and well-nourished.  HENT:  Head: Normocephalic and atraumatic.  Mouth/Throat: Oropharynx is clear and moist.  Eyes: Pupils are equal, round, and reactive to light.  Neck: Normal range of motion. Neck supple.  Cardiovascular: Normal rate and regular rhythm.   No murmur heard. Pulmonary/Chest: Effort normal and breath sounds normal.  Abdominal: Soft. Bowel sounds are normal. There is no tenderness.  Neurological: She is alert and oriented to person, place, and time.  Skin: Skin is warm and dry.  Psychiatric: She has a normal mood and affect.          Assessment & Plan:     ICD-9-CM ICD-10-CM   1. Acute bronchitis due to Rhinovirus 466.0 J20.6 methylPREDNIsolone (MEDROL DOSPACK) 4 MG tablet   079.3  azithromycin (ZITHROMAX) 250 MG tablet     benzonatate (TESSALON PERLES) 100 MG capsule   Push po fluids, rest, tylenol and motrin otc prn as directed for fever, arthralgias, and myalgias.  Follow up prn if sx's continue or persist.  No Follow-up on  file.  Lysbeth Penner FNP

## 2014-11-25 ENCOUNTER — Other Ambulatory Visit: Payer: Self-pay | Admitting: Family Medicine

## 2014-11-25 DIAGNOSIS — J206 Acute bronchitis due to rhinovirus: Secondary | ICD-10-CM

## 2014-11-25 MED ORDER — METHYLPREDNISOLONE (PAK) 4 MG PO TABS: ORAL_TABLET | ORAL | Status: DC

## 2014-11-25 MED ORDER — AZITHROMYCIN 250 MG PO TABS: ORAL_TABLET | ORAL | Status: DC

## 2014-11-25 NOTE — Telephone Encounter (Signed)
Meds refilled.

## 2014-11-25 NOTE — Telephone Encounter (Signed)
Detailed message left that medications have been refilled.

## 2014-11-29 ENCOUNTER — Other Ambulatory Visit: Payer: Self-pay | Admitting: Family Medicine

## 2014-11-29 ENCOUNTER — Telehealth: Payer: Self-pay | Admitting: Family Medicine

## 2014-11-29 MED ORDER — FLUCONAZOLE 150 MG PO TABS: 150.0000 mg | ORAL_TABLET | Freq: Once | ORAL | Status: DC

## 2014-12-11 ENCOUNTER — Other Ambulatory Visit: Payer: Self-pay | Admitting: Nurse Practitioner

## 2014-12-11 NOTE — Telephone Encounter (Signed)
Dr Barbie Banner is rx'ing this and pt will contact them and [facility]

## 2014-12-11 NOTE — Telephone Encounter (Signed)
Please advise on refill. Metformin not on patient's med list.

## 2014-12-11 NOTE — Telephone Encounter (Signed)
Not on med list please find out who rx.

## 2015-01-21 ENCOUNTER — Ambulatory Visit (INDEPENDENT_AMBULATORY_CARE_PROVIDER_SITE_OTHER): Payer: BLUE CROSS/BLUE SHIELD | Admitting: Nurse Practitioner

## 2015-01-21 ENCOUNTER — Encounter: Payer: Self-pay | Admitting: Nurse Practitioner

## 2015-01-21 VITALS — BP 130/100 | HR 80 | Temp 97.8°F | Ht 64.0 in | Wt 278.0 lb

## 2015-01-21 DIAGNOSIS — F329 Major depressive disorder, single episode, unspecified: Secondary | ICD-10-CM | POA: Diagnosis not present

## 2015-01-21 DIAGNOSIS — M797 Fibromyalgia: Secondary | ICD-10-CM

## 2015-01-21 DIAGNOSIS — Z01419 Encounter for gynecological examination (general) (routine) without abnormal findings: Secondary | ICD-10-CM | POA: Diagnosis not present

## 2015-01-21 DIAGNOSIS — Z30432 Encounter for removal of intrauterine contraceptive device: Secondary | ICD-10-CM

## 2015-01-21 DIAGNOSIS — E282 Polycystic ovarian syndrome: Secondary | ICD-10-CM

## 2015-01-21 DIAGNOSIS — G47419 Narcolepsy without cataplexy: Secondary | ICD-10-CM | POA: Diagnosis not present

## 2015-01-21 DIAGNOSIS — Z23 Encounter for immunization: Secondary | ICD-10-CM

## 2015-01-21 DIAGNOSIS — F32A Depression, unspecified: Secondary | ICD-10-CM

## 2015-01-21 DIAGNOSIS — Z0000 Encounter for general adult medical examination without abnormal findings: Secondary | ICD-10-CM

## 2015-01-21 LAB — POCT CBC
Granulocyte percent: 65.1 %G (ref 37–80)
HEMATOCRIT: 44.5 % (ref 37.7–47.9)
Hemoglobin: 13.6 g/dL (ref 12.2–16.2)
Lymph, poc: 2.7 (ref 0.6–3.4)
MCH: 26 pg — AB (ref 27–31.2)
MCHC: 30.5 g/dL — AB (ref 31.8–35.4)
MCV: 85.1 fL (ref 80–97)
MPV: 8.3 fL (ref 0–99.8)
POC Granulocyte: 5.6 (ref 2–6.9)
POC LYMPH PERCENT: 31.1 %L (ref 10–50)
Platelet Count, POC: 358 10*3/uL (ref 142–424)
RBC: 5.2 M/uL (ref 4.04–5.48)
RDW, POC: 14.9 %
WBC: 8.6 10*3/uL (ref 4.6–10.2)

## 2015-01-21 LAB — POCT UA - MICROSCOPIC ONLY
Casts, Ur, LPF, POC: NEGATIVE
Crystals, Ur, HPF, POC: NEGATIVE
Yeast, UA: NEGATIVE

## 2015-01-21 LAB — POCT URINALYSIS DIPSTICK
Bilirubin, UA: NEGATIVE
Glucose, UA: NEGATIVE
Ketones, UA: NEGATIVE
LEUKOCYTES UA: NEGATIVE
Nitrite, UA: NEGATIVE
Spec Grav, UA: 1.015
UROBILINOGEN UA: NEGATIVE
pH, UA: 7

## 2015-01-21 MED ORDER — METFORMIN HCL ER 500 MG PO TB24: 500.0000 mg | ORAL_TABLET | Freq: Two times a day (BID) | ORAL | Status: DC

## 2015-01-21 NOTE — Progress Notes (Signed)
Subjective:    Patient ID: Rachael Allen, female    DOB: 09/04/1971, 44 y.o.   MRN: [id number]  HPI  Patient here today for CPE with pap. No acute complaint.  * wants mirena removed and will have put back in in several months. *patient sees a psychiatrists for bipolar. She follow up with them every six week.   -Fibromyalgia Currently not taking anything - tries to exercise- Has pain daily.  -Depression On lamictal which works well to keep emotions under control, no adverse effect reported.  -Narcolepsy Currently on nuvigil which does well to keep her from sleeping all the time.   Review of Systems  Constitutional: Negative.   HENT: Negative.   Respiratory: Negative.   Cardiovascular: Negative.   Musculoskeletal: Negative.   Skin: Positive for rash (right abdominal area, multiple erythematous lesions noted. ).  Neurological: Negative.   Psychiatric/Behavioral: Negative.   All other systems reviewed and are negative.      Objective:   Physical Exam  Constitutional: She is oriented to person, place, and time. She appears well-developed and well-nourished.  HENT:  Head: Normocephalic.  Right Ear: Hearing, tympanic membrane, external ear and ear canal normal.  Left Ear: Hearing, tympanic membrane, external ear and ear canal normal.  Nose: Nose normal.  Mouth/Throat: Uvula is midline and oropharynx is clear and moist.  Eyes: Conjunctivae and EOM are normal. Pupils are equal, round, and reactive to light.  Neck: Trachea normal, normal range of motion and full passive range of motion without pain. Neck supple. No JVD present. Carotid bruit is not present. No thyroid mass and no thyromegaly present.  Cardiovascular: Normal rate, regular rhythm, normal heart sounds and intact distal pulses.  Exam reveals no gallop and no friction rub.   No murmur heard. Pulmonary/Chest: Effort normal and breath sounds normal. Right breast exhibits no inverted nipple, no mass, no nipple discharge,  no skin change and no tenderness. Left breast exhibits no inverted nipple, no mass, no nipple discharge, no skin change and no tenderness.  Abdominal: Soft. Bowel sounds are normal. She exhibits no distension and no mass. There is no tenderness.  Genitourinary: Vagina normal and uterus normal. No breast swelling, tenderness, discharge or bleeding.  bimanual exam-No adnexal masses or tenderness. Cervix parous and pink- no discharge  Musculoskeletal: Normal range of motion.  Point tenderness all up and down back  Lymphadenopathy:    She has no cervical adenopathy.  Neurological: She is alert and oriented to person, place, and time. She has normal reflexes.  Skin: Skin is warm and dry.  Psychiatric: She has a normal mood and affect. Her behavior is normal. Judgment and thought content normal.   BP 130/100 mmHg  Pulse 80  Temp(Src) 97.8 F (36.6 C) (Oral)  Ht '5\' 4"'  (1.626 m)  Wt 278 lb (126.1 kg)  BMI 47.70 kg/m2  mirena removal- intact      Assessment & Plan:    1. Annual physical exam - POCT UA - Microscopic Only - POCT urinalysis dipstick - POCT CBC - CMP14+EGFR - NMR, lipoprofile - Thyroid Panel With TSH - Vit D  25 hydroxy (rtn osteoporosis monitoring)  2. Encounter for routine gynecological examination - Pap IG w/ reflex to HPV when ASC-U  3. PCOS (polycystic ovarian syndrome) - metFORMIN (GLUCOPHAGE-XR) 500 MG 24 hr tablet; Take 1 tablet (500 mg total) by mouth 2 (two) times daily.  Dispense: 60 tablet; Refill: 5  4. Fibromyalgia muscle pain  5. Narcolepsy  6. Depression  Tetanus shot today Patient will make mammogram appointment stress management Call when ready for mirena reinsertion Labs pending Health maintenance reviewed Diet and exercise encouraged Continue all meds Follow up  In 6 month   [place], FNP

## 2015-01-21 NOTE — Patient Instructions (Signed)

## 2015-01-21 NOTE — Addendum Note (Signed)
Addended by: Shelbie Ammons on: 01/21/2015 03:23 PM   Modules accepted: Orders

## 2015-01-22 LAB — NMR, LIPOPROFILE
CHOLESTEROL: 163 mg/dL (ref 100–199)
HDL Cholesterol by NMR: 31 mg/dL — ABNORMAL LOW (ref >39)
HDL Particle Number: 28.7 umol/L — ABNORMAL LOW (ref >=30.5)
LDL Particle Number: 1431 nmol/L — ABNORMAL HIGH (ref <1000)
LDL SIZE: 20.3 nm (ref >20.5)
LDL-C: 87 mg/dL (ref 0–99)
LP-IR Score: 89 — ABNORMAL HIGH (ref <=45)
SMALL LDL PARTICLE NUMBER: 949 nmol/L — AB (ref <=527)
Triglycerides by NMR: 223 mg/dL — ABNORMAL HIGH (ref 0–149)

## 2015-01-22 LAB — THYROID PANEL WITH TSH
Free Thyroxine Index: 2.4 (ref 1.2–4.9)
T3 UPTAKE RATIO: 26 % (ref 24–39)
T4, Total: 9.2 ug/dL (ref 4.5–12.0)
TSH: 1.53 u[IU]/mL (ref 0.450–4.500)

## 2015-01-22 LAB — PAP IG W/ RFLX HPV ASCU: PAP Smear Comment: 0

## 2015-01-22 LAB — CMP14+EGFR
ALT: 29 IU/L (ref 0–32)
AST: 20 IU/L (ref 0–40)
Albumin/Globulin Ratio: 2.2 (ref 1.1–2.5)
Albumin: 4.4 g/dL (ref 3.5–5.5)
Alkaline Phosphatase: 64 IU/L (ref 39–117)
BILIRUBIN TOTAL: 0.3 mg/dL (ref 0.0–1.2)
BUN/Creatinine Ratio: 11 (ref 9–23)
BUN: 7 mg/dL (ref 6–24)
CALCIUM: 9.5 mg/dL (ref 8.7–10.2)
CO2: 26 mmol/L (ref 18–29)
Chloride: 102 mmol/L (ref 97–108)
Creatinine, Ser: 0.62 mg/dL (ref 0.57–1.00)
GFR calc Af Amer: 127 mL/min/{1.73_m2} (ref >59)
GFR calc non Af Amer: 110 mL/min/{1.73_m2} (ref >59)
Globulin, Total: 2 g/dL (ref 1.5–4.5)
Glucose: 101 mg/dL — ABNORMAL HIGH (ref 65–99)
Potassium: 4.1 mmol/L (ref 3.5–5.2)
Sodium: 140 mmol/L (ref 134–144)
Total Protein: 6.4 g/dL (ref 6.0–8.5)

## 2015-01-22 LAB — VITAMIN D 25 HYDROXY (VIT D DEFICIENCY, FRACTURES): Vit D, 25-Hydroxy: 22.4 ng/mL — ABNORMAL LOW (ref 30.0–100.0)

## 2015-02-04 ENCOUNTER — Other Ambulatory Visit: Payer: Self-pay | Admitting: Nurse Practitioner

## 2015-02-05 NOTE — Telephone Encounter (Signed)
Last seen 01/21/15 MMM

## 2015-02-17 ENCOUNTER — Other Ambulatory Visit: Payer: Self-pay | Admitting: *Deleted

## 2015-02-17 DIAGNOSIS — E282 Polycystic ovarian syndrome: Secondary | ICD-10-CM

## 2015-02-17 MED ORDER — METFORMIN HCL ER 500 MG PO TB24: 500.0000 mg | ORAL_TABLET | Freq: Two times a day (BID) | ORAL | Status: DC

## 2015-02-17 NOTE — Telephone Encounter (Signed)
Metformin refill request authorized for management of PCOS symptoms.

## 2015-03-05 ENCOUNTER — Telehealth: Payer: Self-pay | Admitting: Nurse Practitioner

## 2015-03-06 ENCOUNTER — Telehealth: Payer: Self-pay | Admitting: Nurse Practitioner

## 2015-03-06 NOTE — Telephone Encounter (Signed)
Tell wendy to look in my cabinet- i think there is one in there.

## 2015-03-06 NOTE — Telephone Encounter (Signed)
Stp she is wanting Mirena IUD, has had it previously and states she is aware you need to order. Advised pt we would order it and when we receive IUD in office we will call pt and then she can call on day 1 of her menses to schedule insertion around day 5. Pt is aware you are on vacation until late next week.

## 2015-03-06 NOTE — Telephone Encounter (Signed)
Pt has multiple open calls will close this encounter.

## 2015-03-11 NOTE — Telephone Encounter (Signed)
Patient aware and she will call me on day one of her cycle.

## 2015-03-27 ENCOUNTER — Ambulatory Visit: Payer: BLUE CROSS/BLUE SHIELD | Admitting: Podiatry

## 2015-04-01 ENCOUNTER — Ambulatory Visit: Payer: BLUE CROSS/BLUE SHIELD | Admitting: Podiatry

## 2015-04-03 ENCOUNTER — Telehealth: Payer: Self-pay | Admitting: Nurse Practitioner

## 2015-04-03 NOTE — Telephone Encounter (Signed)
Pt given appt with MMM 4/25 at 9, which is day 5 of her cycle.

## 2015-04-07 ENCOUNTER — Ambulatory Visit (INDEPENDENT_AMBULATORY_CARE_PROVIDER_SITE_OTHER): Payer: BLUE CROSS/BLUE SHIELD | Admitting: Nurse Practitioner

## 2015-04-07 ENCOUNTER — Encounter: Payer: Self-pay | Admitting: Nurse Practitioner

## 2015-04-07 VITALS — BP 116/67 | HR 59 | Temp 97.2°F | Ht 64.0 in | Wt 280.0 lb

## 2015-04-07 DIAGNOSIS — Z30430 Encounter for insertion of intrauterine contraceptive device: Secondary | ICD-10-CM | POA: Diagnosis not present

## 2015-04-07 LAB — POCT URINE PREGNANCY: Preg Test, Ur: NEGATIVE

## 2015-04-07 MED ORDER — LEVONORGESTREL 20 MCG/24HR IU IUD: INTRAUTERINE_SYSTEM | Freq: Once | INTRAUTERINE | Status: AC

## 2015-04-07 NOTE — Patient Instructions (Addendum)

## 2015-04-07 NOTE — Progress Notes (Signed)
   Subjective:    Patient ID: Rachael Allen, female    DOB: [birth year], 44 y.o.   MRN: [id number]  HPI Patient here today for reinsertion of mirena- She had her last one removed several months ago and is ready for another one.    Review of Systems  Constitutional: Negative.   HENT: Negative.   Respiratory: Negative.   Cardiovascular: Negative.   Genitourinary: Negative.   Neurological: Negative.   Psychiatric/Behavioral: Negative.   All other systems reviewed and are negative.      Objective:   Physical Exam  Constitutional: She is oriented to person, place, and time. She appears well-developed and well-nourished.  HENT:  Nose: Nose normal.  Mouth/Throat: Oropharynx is clear and moist.  Eyes: EOM are normal.  Neck: Trachea normal, normal range of motion and full passive range of motion without pain. Neck supple. No JVD present. Carotid bruit is not present. No thyromegaly present.  Cardiovascular: Normal rate, regular rhythm, normal heart sounds and intact distal pulses.  Exam reveals no gallop and no friction rub.   No murmur heard. Pulmonary/Chest: Effort normal and breath sounds normal.  Abdominal: Soft. Bowel sounds are normal. She exhibits no distension and no mass. There is no tenderness.  Genitourinary:  Cervix parous and pink  Musculoskeletal: Normal range of motion.  Lymphadenopathy:    She has no cervical adenopathy.  Neurological: She is alert and oriented to person, place, and time. She has normal reflexes.  Skin: Skin is warm and dry.  Psychiatric: She has a normal mood and affect. Her behavior is normal. Judgment and thought content normal.  BP 116/67 mmHg  Pulse 59  Temp(Src) 97.2 F (36.2 C) (Oral)  Ht 5\' 4"  (1.626 m)  Wt 280 lb (127.007 kg)  BMI 48.04 kg/m2 Results for orders placed or performed in visit on 04/07/15  POCT urine pregnancy  Result Value Ref Range   Preg Test, Ur Negative     Procedure:  Lithotomy position  Large speculum  inserted  Cervix cleaned with betadine  Single tooth tenaculum to ant. Lip of cervix  Uterine sound- measured 7  mirena set on 7 nad inserted without complications  Tenaculum removed  Speculum removed  Out of lithotomy position       Assessment & Plan:  1. Encounter for IUD insertion Patient tolerated well Follow up in 1 month - POCT urine pregnancy  Mary-Margaret Hassell Done, FNP

## 2015-04-08 ENCOUNTER — Telehealth: Payer: Self-pay | Admitting: Nurse Practitioner

## 2015-04-08 ENCOUNTER — Ambulatory Visit (INDEPENDENT_AMBULATORY_CARE_PROVIDER_SITE_OTHER): Payer: Medicare HMO | Admitting: Podiatry

## 2015-04-08 ENCOUNTER — Encounter: Payer: Self-pay | Admitting: Podiatry

## 2015-04-08 VITALS — BP 137/59 | HR 70 | Resp 16

## 2015-04-08 DIAGNOSIS — M722 Plantar fascial fibromatosis: Secondary | ICD-10-CM | POA: Diagnosis not present

## 2015-04-08 NOTE — Telephone Encounter (Signed)
Patient aware that the mirena is latex free and everything we used to insert the mirena is latex free also.

## 2015-04-08 NOTE — Progress Notes (Signed)
She presents today for a follow-up of her plantar fasciitis she states that she is doing well. She states that my fasciitis just flared up and I would like to consider getting appear orthotics.  Objective: Vital signs are stable she is alert and oriented 3. Pulses are strongly palpable bilateral. She has pain on palpation medial calcaneal tubercle and bilateral heel.  Assessment: Plantar fasciitis bilateral.  Plan: Injected the bilateral heels today. Kenalog and local anesthetic was injected. She scan for some orthotics.

## 2015-05-06 ENCOUNTER — Ambulatory Visit: Payer: BLUE CROSS/BLUE SHIELD | Admitting: Podiatry

## 2015-05-07 ENCOUNTER — Ambulatory Visit: Payer: BLUE CROSS/BLUE SHIELD | Admitting: Nurse Practitioner

## 2015-06-04 ENCOUNTER — Other Ambulatory Visit: Payer: Self-pay | Admitting: Nurse Practitioner

## 2015-06-17 ENCOUNTER — Encounter (INDEPENDENT_AMBULATORY_CARE_PROVIDER_SITE_OTHER): Payer: Self-pay

## 2015-06-17 ENCOUNTER — Ambulatory Visit (INDEPENDENT_AMBULATORY_CARE_PROVIDER_SITE_OTHER): Payer: BLUE CROSS/BLUE SHIELD | Admitting: Nurse Practitioner

## 2015-06-17 ENCOUNTER — Encounter: Payer: Self-pay | Admitting: Nurse Practitioner

## 2015-06-17 VITALS — BP 123/80 | HR 72 | Temp 98.0°F | Ht 64.0 in | Wt 272.0 lb

## 2015-06-17 DIAGNOSIS — Z30431 Encounter for routine checking of intrauterine contraceptive device: Secondary | ICD-10-CM | POA: Diagnosis not present

## 2015-06-17 NOTE — Progress Notes (Signed)
   Subjective:    Patient ID: Rachael Allen, female    DOB: [birth year], 44 y.o.   MRN: [id number]  HPI Patient had mirena inserted on 04/07/15 and is here today for recheck- Sheis doing well without complaints. Denies any painful intercourse- no cramping- has no menses when has mirena in.    Review of Systems  Constitutional: Negative.   HENT: Negative.   Respiratory: Negative.   Cardiovascular: Negative.   Genitourinary: Negative.   Neurological: Negative.   Psychiatric/Behavioral: Negative.   All other systems reviewed and are negative.      Objective:   Physical Exam  Constitutional: She is oriented to person, place, and time. She appears well-developed and well-nourished.  Cardiovascular: Normal rate, regular rhythm and normal heart sounds.   Pulmonary/Chest: Effort normal and breath sounds normal.  Abdominal: Soft. Bowel sounds are normal.  Genitourinary:  String palpable without pain or discomfort  Neurological: She is alert and oriented to person, place, and time.  Skin: Skin is warm.  Psychiatric: She has a normal mood and affect. Her behavior is normal. Judgment and thought content normal.    BP 123/80 mmHg  Pulse 72  Temp(Src) 98 F (36.7 C) (Oral)  Ht 5\' 4"  (1.626 m)  Wt 272 lb (123.378 kg)  BMI 46.67 kg/m2       Assessment & Plan:   1. IUD check up    RTO prn  Mary-Margaret Hassell Done, FNP

## 2015-09-08 ENCOUNTER — Other Ambulatory Visit: Payer: Self-pay | Admitting: Nurse Practitioner

## 2015-09-16 ENCOUNTER — Ambulatory Visit: Payer: BLUE CROSS/BLUE SHIELD | Admitting: *Deleted

## 2015-09-16 DIAGNOSIS — M722 Plantar fascial fibromatosis: Secondary | ICD-10-CM

## 2015-09-16 NOTE — Patient Instructions (Signed)

## 2015-09-16 NOTE — Progress Notes (Signed)
Patient ID: Rachael Allen, female   DOB: [birth year], 44 y.o.   MRN: [id number] Patient presents for orthotic pick up.  Verbal and written break in and wear instructions given.  Patient will follow up in 4 weeks if symptoms worsen or fail to improve.

## 2015-10-14 ENCOUNTER — Ambulatory Visit: Payer: BLUE CROSS/BLUE SHIELD | Admitting: Podiatry

## 2015-11-14 ENCOUNTER — Ambulatory Visit: Payer: BLUE CROSS/BLUE SHIELD | Admitting: Nurse Practitioner

## 2015-11-18 ENCOUNTER — Encounter: Payer: Self-pay | Admitting: Nurse Practitioner

## 2015-11-18 ENCOUNTER — Ambulatory Visit (INDEPENDENT_AMBULATORY_CARE_PROVIDER_SITE_OTHER): Payer: BLUE CROSS/BLUE SHIELD | Admitting: Nurse Practitioner

## 2015-11-18 VITALS — BP 132/86 | HR 59 | Temp 98.7°F | Ht 64.0 in | Wt 265.0 lb

## 2015-11-18 DIAGNOSIS — M797 Fibromyalgia: Secondary | ICD-10-CM

## 2015-11-18 NOTE — Progress Notes (Signed)
   Subjective:    Patient ID: Rachael Allen, female    DOB: 1971-03-21, 44 y.o.   MRN: [id number]  HPI Patient in c/o fibromyalgia and tarsel tunnel in bil feet and she has a hrd time walking long distances- would like to have a handi cap sticker. SHe is on meds for her fibro and tarsal tunnel which helps.    Review of Systems  Constitutional: Negative.   HENT: Negative.   Respiratory: Negative.   Cardiovascular: Negative.   Genitourinary: Negative.   Neurological: Negative.   Psychiatric/Behavioral: Negative.   All other systems reviewed and are negative.      Objective:   Physical Exam  Constitutional: She is oriented to person, place, and time. She appears well-developed and well-nourished.  Cardiovascular: Normal rate, regular rhythm and normal heart sounds.   Pulmonary/Chest: Effort normal and breath sounds normal.  Musculoskeletal:  Multiple tender spot up and down back- no tenderness bil feet  Neurological: She is alert and oriented to person, place, and time.  Skin: Skin is warm.  Psychiatric: She has a normal mood and affect. Her behavior is normal. Judgment and thought content normal.    BP 132/86 mmHg  Pulse 59  Temp(Src) 98.7 F (37.1 C) (Oral)  Ht $R'5\' 4"'gQ$  (1.626 m)  Wt 265 lb (120.203 kg)  BMI 45.46 kg/m2       Assessment & Plan:  1. Fibromyalgia muscle pain Patient given handicap papers RTO prn  Mary-Margaret Hassell Done, FNP

## 2015-12-06 ENCOUNTER — Other Ambulatory Visit: Payer: Self-pay | Admitting: Nurse Practitioner

## 2015-12-10 ENCOUNTER — Ambulatory Visit (INDEPENDENT_AMBULATORY_CARE_PROVIDER_SITE_OTHER): Payer: BLUE CROSS/BLUE SHIELD | Admitting: Physician Assistant

## 2015-12-10 ENCOUNTER — Encounter: Payer: Self-pay | Admitting: Physician Assistant

## 2015-12-10 VITALS — BP 138/89 | HR 70 | Temp 97.9°F | Ht 64.0 in | Wt 259.2 lb

## 2015-12-10 DIAGNOSIS — J0110 Acute frontal sinusitis, unspecified: Secondary | ICD-10-CM

## 2015-12-10 DIAGNOSIS — H00012 Hordeolum externum right lower eyelid: Secondary | ICD-10-CM

## 2015-12-10 MED ORDER — AMOXICILLIN-POT CLAVULANATE 875-125 MG PO TABS: 1.0000 | ORAL_TABLET | Freq: Two times a day (BID) | ORAL | Status: DC

## 2015-12-10 NOTE — Patient Instructions (Signed)
Sinusitis, Adult Sinusitis is redness, soreness, and inflammation of the paranasal sinuses. Paranasal sinuses are air pockets within the bones of your face. They are located beneath your eyes, in the middle of your forehead, and above your eyes. In healthy paranasal sinuses, mucus is able to drain out, and air is able to circulate through them by way of your nose. However, when your paranasal sinuses are inflamed, mucus and air can become trapped. This can allow bacteria and other germs to grow and cause infection. Sinusitis can develop quickly and last only a short time (acute) or continue over a long period (chronic). Sinusitis that lasts for more than 12 weeks is considered chronic. CAUSES Causes of sinusitis include:  Allergies.  Structural abnormalities, such as displacement of the cartilage that separates your nostrils (deviated septum), which can decrease the air flow through your nose and sinuses and affect sinus drainage.  Functional abnormalities, such as when the small hairs (cilia) that line your sinuses and help remove mucus do not work properly or are not present. SIGNS AND SYMPTOMS Symptoms of acute and chronic sinusitis are the same. The primary symptoms are pain and pressure around the affected sinuses. Other symptoms include:  Upper toothache.  Earache.  Headache.  Bad breath.  Decreased sense of smell and taste.  A cough, which worsens when you are lying flat.  Fatigue.  Fever.  Thick drainage from your nose, which often is green and may contain pus (purulent).  Swelling and warmth over the affected sinuses. DIAGNOSIS Your health care provider will perform a physical exam. During your exam, your health care provider may perform any of the following to help determine if you have acute sinusitis or chronic sinusitis:  Look in your nose for signs of abnormal growths in your nostrils (nasal polyps).  Tap over the affected sinus to check for signs of  infection.  View the inside of your sinuses using an imaging device that has a light attached (endoscope). If your health care provider suspects that you have chronic sinusitis, one or more of the following tests may be recommended:  Allergy tests.  Nasal culture. A sample of mucus is taken from your nose, sent to a lab, and screened for bacteria.  Nasal cytology. A sample of mucus is taken from your nose and examined by your health care provider to determine if your sinusitis is related to an allergy. TREATMENT Most cases of acute sinusitis are related to a viral infection and will resolve on their own within 10 days. Sometimes, medicines are prescribed to help relieve symptoms of both acute and chronic sinusitis. These may include pain medicines, decongestants, nasal steroid sprays, or saline sprays. However, for sinusitis related to a bacterial infection, your health care provider will prescribe antibiotic medicines. These are medicines that will help kill the bacteria causing the infection. Rarely, sinusitis is caused by a fungal infection. In these cases, your health care provider will prescribe antifungal medicine. For some cases of chronic sinusitis, surgery is needed. Generally, these are cases in which sinusitis recurs more than 3 times per year, despite other treatments. HOME CARE INSTRUCTIONS  Drink plenty of water. Water helps thin the mucus so your sinuses can drain more easily.  Use a humidifier.  Inhale steam 3-4 times a day (for example, sit in the bathroom with the shower running).  Apply a warm, moist washcloth to your face 3-4 times a day, or as directed by your health care provider.  Use saline nasal sprays to help   moisten and clean your sinuses.  Take medicines only as directed by your health care provider.  If you were prescribed either an antibiotic or antifungal medicine, finish it all even if you start to feel better. SEEK IMMEDIATE MEDICAL CARE IF:  You have  increasing pain or severe headaches.  You have nausea, vomiting, or drowsiness.  You have swelling around your face.  You have vision problems.  You have a stiff neck.  You have difficulty breathing.   This information is not intended to replace advice given to you by your health care provider. Make sure you discuss any questions you have with your health care provider.   Document Released: 11/29/2005 Document Revised: 12/20/2014 Document Reviewed: 12/14/2011 Elsevier Interactive Patient Education 2016 [place] is a bump on your eyelid caused by a bacterial infection. A stye can form inside the eyelid (internal stye) or outside the eyelid (external stye). An internal stye may be caused by an infected oil-producing gland inside your eyelid. An external stye may be caused by an infection at the base of your eyelash (hair follicle). Styes are very common. Anyone can get them at any age. They usually occur in just one eye, but you may have more than one in either eye.  CAUSES  The infection is almost always caused by bacteria called Staphylococcus aureus. This is a common type of bacteria that lives on your skin. RISK FACTORS You may be at higher risk for a stye if you have had one before. You may also be at higher risk if you have:  Diabetes.  Long-term illness.  Long-term eye redness.  A skin condition called seborrhea.  High fat levels in your blood (lipids). SIGNS AND SYMPTOMS  Eyelid pain is the most common symptom of a stye. Internal styes are more painful than external styes. Other signs and symptoms may include:  Painful swelling of your eyelid.  A scratchy feeling in your eye.  Tearing and redness of your eye.  Pus draining from the stye. DIAGNOSIS  Your health care provider may be able to diagnose a stye just by examining your eye. The health care provider may also check to make sure:  You do not have a fever or other signs of a more serious  infection.  The infection has not spread to other parts of your eye or areas around your eye. TREATMENT  Most styes will clear up in a few days without treatment. In some cases, you may need to use antibiotic drops or ointment to prevent infection. Your health care provider may have to drain the stye surgically if your stye is:  Large.  Causing a lot of pain.  Interfering with your vision. This can be done using a thin blade or a needle.  HOME CARE INSTRUCTIONS   Take medicines only as directed by your health care provider.  Apply a clean, warm compress to your eye for 10 minutes, 4 times a day.  Do not wear contact lenses or eye makeup until your stye has healed.  Do not try to pop or drain the stye. SEEK MEDICAL CARE IF:  You have chills or a fever.  Your stye does not go away after several days.  Your stye affects your vision.  Your eyeball becomes swollen, red, or painful. MAKE SURE YOU:  Understand these instructions.  Will watch your condition.  Will get help right away if you are not doing well or get worse.   This information is not  intended to replace advice given to you by your health care provider. Make sure you discuss any questions you have with your health care provider.   Document Released: 09/08/2005 Document Revised: 12/20/2014 Document Reviewed: 03/15/2014 Elsevier Interactive Patient Education [employer].

## 2015-12-10 NOTE — Progress Notes (Signed)
Subjective:     Patient ID: Rachael Allen, female   DOB: [birth year], 44 y.o.   MRN: [id number]  HPI Pt with head congestion and cough  Also with stye to the R eye  Review of Systems  Constitutional: Positive for activity change, appetite change and fatigue.  HENT: Positive for congestion, postnasal drip, sinus pressure and sore throat. Negative for ear pain.   Eyes: Positive for pain.  Respiratory: Positive for cough. Negative for shortness of breath and wheezing.   Cardiovascular: Negative.        Objective:   Physical Exam  Constitutional: She appears well-developed and well-nourished.  HENT:  Right Ear: External ear normal.  Left Ear: External ear normal.  Mouth/Throat: Oropharynx is clear and moist. No oropharyngeal exudate.  + frontal sinus TTP  Eyes: Conjunctivae and EOM are normal. Pupils are equal, round, and reactive to light.  Stye to the lateral R bottom lid Sl edema to the lid  Neck: Neck supple.  Cardiovascular: Normal rate, regular rhythm and normal heart sounds.   Pulmonary/Chest: Breath sounds normal. No respiratory distress. She has no wheezes. She has no rales. She exhibits no tenderness.  Lymphadenopathy:    She has no cervical adenopathy.  Nursing note and vitals reviewed.      Assessment:     Sinusitis Stye R lower eyelid    Plan:     Augmentin 875mg  bid x 10 days She has tolerated Claritin D prev so restart Fluid Rest Hydration F/U prn

## 2015-12-16 DIAGNOSIS — F3181 Bipolar II disorder: Secondary | ICD-10-CM | POA: Diagnosis not present

## 2015-12-19 DIAGNOSIS — G4733 Obstructive sleep apnea (adult) (pediatric): Secondary | ICD-10-CM | POA: Diagnosis not present

## 2015-12-26 ENCOUNTER — Ambulatory Visit (INDEPENDENT_AMBULATORY_CARE_PROVIDER_SITE_OTHER): Payer: Commercial Managed Care - HMO | Admitting: Pediatrics

## 2015-12-26 ENCOUNTER — Encounter (INDEPENDENT_AMBULATORY_CARE_PROVIDER_SITE_OTHER): Payer: Self-pay

## 2015-12-26 ENCOUNTER — Encounter: Payer: Self-pay | Admitting: Pediatrics

## 2015-12-26 VITALS — BP 130/79 | HR 57 | Temp 97.7°F | Ht 64.0 in | Wt 256.8 lb

## 2015-12-26 DIAGNOSIS — H8121 Vestibular neuronitis, right ear: Secondary | ICD-10-CM | POA: Diagnosis not present

## 2015-12-26 MED ORDER — MECLIZINE HCL 12.5 MG PO TABS: 12.5000 mg | ORAL_TABLET | Freq: Three times a day (TID) | ORAL | Status: DC | PRN

## 2015-12-26 MED ORDER — METHYLPREDNISOLONE 4 MG PO TBPK: ORAL_TABLET | ORAL | Status: DC

## 2015-12-26 NOTE — Progress Notes (Signed)
Subjective:    Patient ID: Rachael Allen, female    DOB: 22-Jan-1971, 45 y.o.   MRN: [id number]  CC: Dizziness   HPI: Rachael Allen is a 45 y.o. female presenting for Dizziness  Finished abx last week from sinusitis Still stuffy from sinusitis Feels like everything is spinning for the past 3 days Has been fairly consistent, does not come and go isnt sure if it is always in the same direction or not Symptoms happen with any turn of her head Has never had this before No headaches, no vision changes   Depression screen [facility] 2/9 12/26/2015 12/10/2015 01/21/2015 10/28/2014  Decreased Interest 2 0 2 1  Down, Depressed, Hopeless 1 0 1 1  PHQ - 2 Score 3 0 3 2  Altered sleeping 2 - 3 1  Tired, decreased energy 3 - 3 2  Change in appetite 0 - 2 1  Feeling bad or failure about yourself  2 - 2 1  Trouble concentrating 2 - 1 2  Moving slowly or fidgety/restless 0 - 1 0  Suicidal thoughts 0 - 0 0  PHQ-9 Score 12 - 15 9  Difficult doing work/chores Somewhat difficult - - -     Relevant past medical, surgical, family and social history reviewed and updated as indicated. Interim medical history since our last visit reviewed. Allergies and medications reviewed and updated.    ROS: Per HPI unless specifically indicated above  History  Smoking status  . Never Smoker   Smokeless tobacco  . Not on file    Past Medical History Patient Active Problem List   Diagnosis Date Noted  . Depression 05/09/2014  . Narcolepsy 05/09/2014  . Migraines 04/19/2013  . Fibromyalgia muscle pain 03/07/2013  . PCOS (polycystic ovarian syndrome) 03/07/2013        Objective:    BP 130/79 mmHg  Pulse 57  Temp(Src) 97.7 F (36.5 C) (Oral)  Ht 5\' 4"  (1.626 m)  Wt 256 lb 12.8 oz (116.484 kg)  BMI 44.06 kg/m2  Wt Readings from Last 3 Encounters:  12/26/15 256 lb 12.8 oz (116.484 kg)  12/10/15 259 lb 3.2 oz (117.572 kg)  11/18/15 265 lb (120.203 kg)     Gen: NAD, alert, cooperative with  exam, NCAT EYES: EOMI, no scleral injection or icterus ENT:  TMs pearly gray b/l, OP without erythema LYMPH: no cervical LAD CV: NRRR, normal S1/S2, no murmur, distal pulses 2+ b/l Resp: CTABL, no wheezes, normal WOB Abd: +BS, soft, NTND. no guarding or organomegaly Ext: No edema, warm Neuro: Alert and oriented, strength equal b/l UE and LE, coordination grossly normal, sensation intact, CN III-XII intact, a couple beats of nystagmus with rightward gaze, none with Leftward gaze. MSK: normal muscle bulk     Assessment & Plan:    Olina was seen today for dizziness, most likely due to vestibular neuritis.  Worsening symptoms with L-ward epley maneuver, improvement in symptoms when starting with R side head turn. Will do steroid taper, meclizine as needed. Let me know if not improving.  Diagnoses and all orders for this visit:  Vestibular neuritis, right -     methylPREDNISolone (MEDROL DOSEPAK) 4 MG TBPK tablet; Day 1: 24mg , 2: 20mg , 3: 16mg , 4: 12mg ,  5: 8mg , 6: 4mg  -     meclizine (ANTIVERT) 12.5 MG tablet; Take 1 tablet (12.5 mg total) by mouth 3 (three) times daily as needed for dizziness.  Follow up plan: prn  Assunta Found, MD [facility] 12/26/2015, 2:59 PM

## 2015-12-26 NOTE — Patient Instructions (Signed)
Epley Maneuver Self-Care  WHAT IS THE EPLEY MANEUVER?  The Epley maneuver is an exercise you can do to relieve symptoms of benign paroxysmal positional vertigo (BPPV). This condition is often just referred to as vertigo. BPPV is caused by the movement of tiny crystals (canaliths) inside your inner ear. The accumulation and movement of canaliths in your inner ear causes a sudden spinning sensation (vertigo) when you move your head to certain positions. Vertigo usually lasts about 30 seconds. BPPV usually occurs in just one ear. If you get vertigo when you lie on your left side, you probably have BPPV in your left ear. Your health care provider can tell you which ear is involved.   BPPV may be caused by a head injury. Many people older than 50 get BPPV for unknown reasons. If you have been diagnosed with BPPV, your health care provider may teach you how to do this maneuver. BPPV is not life threatening (benign) and usually goes away in time.   WHEN SHOULD I PERFORM THE EPLEY MANEUVER?  You can do this maneuver at home whenever you have symptoms of vertigo. You may do the Epley maneuver up to 3 times a day until your symptoms of vertigo go away.  HOW SHOULD I DO THE EPLEY MANEUVER?  1. Sit on the edge of a bed or table with your back straight. Your legs should be extended or hanging over the edge of the bed or table.    2. Turn your head halfway toward the affected ear.    3. Lie backward quickly with your head turned until you are lying flat on your back. You may want to position a pillow under your shoulders.    4. Hold this position for 30 seconds. You may experience an attack of vertigo. This is normal. Hold this position until the vertigo stops.  5. Then turn your head to the opposite direction until your unaffected ear is facing the floor.    6. Hold this position for 30 seconds. You may experience an attack of vertigo. This is normal. Hold this position until the vertigo stops.  7. Now turn your whole body to  the same side as your head. Hold for another 30 seconds.    8. You can then sit back up.  ARE THERE RISKS TO THIS MANEUVER?  In some cases, you may have other symptoms (such as changes in your vision, weakness, or numbness). If you have these symptoms, stop doing the maneuver and call your health care provider. Even if doing these maneuvers relieves your vertigo, you may still have dizziness. Dizziness is the sensation of light-headedness but without the sensation of movement. Even though the Epley maneuver may relieve your vertigo, it is possible that your symptoms will return within 5 years.  WHAT SHOULD I DO AFTER THIS MANEUVER?  After doing the Epley maneuver, you can return to your normal activities. Ask your doctor if there is anything you should do at home to prevent vertigo. This may include:  · Sleeping with two or more pillows to keep your head elevated.  · Not sleeping on the side of your affected ear.  · Getting up slowly from bed.  · Avoiding sudden movements during the day.  · Avoiding extreme head movement, like looking up or bending over.  · Wearing a cervical collar to prevent sudden head movements.  WHAT SHOULD I DO IF MY SYMPTOMS GET WORSE?  Call your health care provider if your vertigo gets worse. Call your provider right way if   you have other symptoms, including:   · Nausea.  · Vomiting.  · Headache.  · Weakness.  · Numbness.  · Vision changes.     This information is not intended to replace advice given to you by your health care provider. Make sure you discuss any questions you have with your health care provider.     Document Released: 12/04/2013 Document Reviewed: 12/04/2013  Elsevier Interactive Patient Education ©2016 Elsevier Inc.

## 2015-12-31 ENCOUNTER — Ambulatory Visit (INDEPENDENT_AMBULATORY_CARE_PROVIDER_SITE_OTHER): Payer: Commercial Managed Care - HMO | Admitting: Pediatrics

## 2015-12-31 ENCOUNTER — Encounter: Payer: Self-pay | Admitting: Pediatrics

## 2015-12-31 VITALS — BP 135/96 | HR 81 | Temp 97.3°F | Ht 64.0 in | Wt 254.4 lb

## 2015-12-31 DIAGNOSIS — R42 Dizziness and giddiness: Secondary | ICD-10-CM

## 2015-12-31 NOTE — Progress Notes (Signed)
Subjective:    Patient ID: [name], female    DOB: 1971/03/18, 45 y.o.   MRN: [id number]  CC: Dizziness   HPI: NANAKO KINNEE is a 45 y.o. female presenting for Dizziness  Feels like she is in a cloud all the time Continues to have dizziness when changes position, feeling nauseous Has not noticed much improvement with methylpred taper started for vestibular neuritis 5 days ago. Meclizine makes her sleepy Very difficult to get through normal daily ADLs Has tried to do epley maneuver at home, has symptoms when done on R side, fewer symptoms L side Does ok walking, sometimes catches herself on something, just goes slowly No vomiting, headache, double vision, speech changes, numbness of face or body    Depression screen [facility] 2/9 12/31/2015 12/26/2015 12/10/2015 01/21/2015 10/28/2014  Decreased Interest 2 2 0 2 1  Down, Depressed, Hopeless 1 1 0 1 1  PHQ - 2 Score 3 3 0 3 2  Altered sleeping - 2 - 3 1  Tired, decreased energy - 3 - 3 2  Change in appetite - 0 - 2 1  Feeling bad or failure about yourself  - 2 - 2 1  Trouble concentrating - 2 - 1 2  Moving slowly or fidgety/restless - 0 - 1 0  Suicidal thoughts - 0 - 0 0  PHQ-9 Score - 12 - 15 9  Difficult doing work/chores - Somewhat difficult - - -     Relevant past medical, surgical, family and social history reviewed and updated as indicated. Interim medical history since our last visit reviewed. Allergies and medications reviewed and updated.    ROS: Per HPI unless specifically indicated above  History  Smoking status  . Never Smoker   Smokeless tobacco  . Not on file    Past Medical History Patient Active Problem List   Diagnosis Date Noted  . Depression 05/09/2014  . Narcolepsy 05/09/2014  . Migraines 04/19/2013  . Fibromyalgia muscle pain 03/07/2013  . PCOS (polycystic ovarian syndrome) 03/07/2013    Current Outpatient Prescriptions  Medication Sig Dispense Refill  . Butalbital-APAP-Caff-Cod  (FIORICET/CODEINE) 50-300-40-30 MG CAPS Take 1 capsule by mouth as needed. 30 capsule 1  . Cariprazine HCl (VRAYLAR) 1.5 MG CAPS Take by mouth.    . meclizine (ANTIVERT) 12.5 MG tablet Take 1 tablet (12.5 mg total) by mouth 3 (three) times daily as needed for dizziness. 30 tablet 0  . metFORMIN (GLUCOPHAGE-XR) 500 MG 24 hr tablet TAKE 1 TABLET TWICE A DAY 180 tablet 0  . methylPREDNISolone (MEDROL DOSEPAK) 4 MG TBPK tablet Day 1: 24mg , 2: 20mg , 3: 16mg , 4: 12mg ,  5: 8mg , 6: 4mg  21 tablet 0  . Multiple Vitamin (THERA) TABS Take 1 tablet by mouth.    . Omega-3 Fatty Acids (FISH OIL) 300 MG CAPS Take 300 mg by mouth.    . valACYclovir (VALTREX) 1000 MG tablet TAKE 2 TABLETS TWICE A DAY FOR 1 DAY THEN AS NEEDED FOR FEVER BLISTERS 12 tablet 2  . Chromium-Cinnamon 100-500 MCG-MG CAPS Take 1,000 mg by mouth. Reported on 12/31/2015    . LATUDA 40 MG TABS tablet Take 20 mg by mouth daily. Reported on 12/31/2015  2   Current Facility-Administered Medications  Medication Dose Route Frequency Provider Last Rate Last Dose  . levonorgestrel (MIRENA) 20 MCG/24HR IUD   Intrauterine Once Mary-Margaret Hassell Done, FNP           Objective:    BP 135/96 mmHg  Pulse 81  Temp(Src) 97.3 F (36.3 C) (Oral)  Ht 5\' 4"  (1.626 m)  Wt 254 lb 6.4 oz (115.395 kg)  BMI 43.65 kg/m2  Wt Readings from Last 3 Encounters:  12/31/15 254 lb 6.4 oz (115.395 kg)  12/26/15 256 lb 12.8 oz (116.484 kg)  12/10/15 259 lb 3.2 oz (117.572 kg)     Gen: NAD, alert, cooperative with exam, NCAT EYES: EOMI, no nystagmus, no scleral injection or icterus ENT:  TMs pearly gray b/l, OP without erythema LYMPH: no cervical LAD CV: NRRR, normal S1/S2, no murmur, distal pulses 2+ b/l Resp: CTABL, no wheezes, normal WOB Abd: +BS, soft, NTND. no guarding or organomegaly Ext: No edema, warm Neuro: Alert and oriented, strength equal b/l UE and LE, CN III_XII intact, coordination normal, normal rapid alternating movements, normal cerebellar  function, normal sensation. Not able to elicit nystagmus with dix-hallpike b/l. Does have vertigo symptoms but no nystagmus with epley maneuver worse with start R-ward gaze/head turn vs L.     Assessment & Plan:    Irelynn was seen today for dizziness ongoing with no improvement. Possible still vestibular neuritis but concerned has had no improvement. Will get imaging as below to rule out central causes of vertigo. If negative consider ENT referral vs PT for vestibular rehab.  Diagnoses and all orders for this visit:  Vertigo -     MR Brain W Wo Contrast; Future   Follow up plan: Return in about 2 weeks (around 01/14/2016), or if symptoms worsen or fail to improve.  Assunta Found, MD [facility] 12/31/2015, 2:11 PM

## 2016-01-02 ENCOUNTER — Encounter: Payer: Self-pay | Admitting: Pediatrics

## 2016-01-06 ENCOUNTER — Encounter: Payer: Self-pay | Admitting: Pediatrics

## 2016-01-07 DIAGNOSIS — F3181 Bipolar II disorder: Secondary | ICD-10-CM | POA: Diagnosis not present

## 2016-01-08 ENCOUNTER — Telehealth: Payer: Self-pay | Admitting: Pediatrics

## 2016-01-08 MED ORDER — LORAZEPAM 1 MG PO TABS: 1.0000 mg | ORAL_TABLET | ORAL | Status: DC | PRN

## 2016-01-08 NOTE — Telephone Encounter (Signed)
OK to call in ativan as above for MRI claustrophobia as requested

## 2016-01-08 NOTE — Telephone Encounter (Signed)
Phoned to pharm and pt aware

## 2016-01-12 ENCOUNTER — Ambulatory Visit (HOSPITAL_COMMUNITY): Payer: Commercial Managed Care - HMO

## 2016-01-13 ENCOUNTER — Telehealth: Payer: Self-pay | Admitting: Pediatrics

## 2016-01-13 ENCOUNTER — Ambulatory Visit (HOSPITAL_COMMUNITY)
Admission: RE | Admit: 2016-01-13 | Discharge: 2016-01-13 | Disposition: A | Discharge status: Home / Self Care | Payer: Commercial Managed Care - HMO | Source: Ambulatory Visit | Attending: Pediatrics | Admitting: Pediatrics

## 2016-01-13 DIAGNOSIS — R42 Dizziness and giddiness: Secondary | ICD-10-CM

## 2016-01-14 ENCOUNTER — Encounter: Payer: Self-pay | Admitting: Pediatrics

## 2016-01-16 ENCOUNTER — Other Ambulatory Visit (HOSPITAL_COMMUNITY): Payer: Commercial Managed Care - HMO

## 2016-01-21 ENCOUNTER — Other Ambulatory Visit: Payer: Self-pay | Admitting: Pediatrics

## 2016-01-21 ENCOUNTER — Ambulatory Visit
Admission: RE | Admit: 2016-01-21 | Discharge: 2016-01-21 | Disposition: A | Discharge status: Home / Self Care | Payer: Commercial Managed Care - HMO | Source: Ambulatory Visit | Attending: Pediatrics | Admitting: Pediatrics

## 2016-01-21 ENCOUNTER — Ambulatory Visit: Admission: RE | Admit: 2016-01-21 | Payer: Commercial Managed Care - HMO | Source: Ambulatory Visit

## 2016-01-21 DIAGNOSIS — R42 Dizziness and giddiness: Secondary | ICD-10-CM | POA: Diagnosis not present

## 2016-01-21 MED ORDER — GADOBENATE DIMEGLUMINE 529 MG/ML IV SOLN
20.0000 mL | Freq: Once | INTRAVENOUS | Status: AC | PRN
  Administered 2016-01-21: 20 mL via INTRAVENOUS

## 2016-01-22 ENCOUNTER — Ambulatory Visit (HOSPITAL_COMMUNITY): Payer: Medicare HMO

## 2016-01-26 ENCOUNTER — Telehealth: Payer: Self-pay | Admitting: Pediatrics

## 2016-01-26 ENCOUNTER — Ambulatory Visit (INDEPENDENT_AMBULATORY_CARE_PROVIDER_SITE_OTHER): Payer: Commercial Managed Care - HMO | Admitting: Family Medicine

## 2016-01-26 ENCOUNTER — Ambulatory Visit (HOSPITAL_COMMUNITY): Payer: Commercial Managed Care - HMO

## 2016-01-26 ENCOUNTER — Encounter: Payer: Self-pay | Admitting: Family Medicine

## 2016-01-26 VITALS — BP 119/80 | HR 71 | Temp 97.2°F | Ht 64.0 in | Wt 252.2 lb

## 2016-01-26 DIAGNOSIS — F3160 Bipolar disorder, current episode mixed, unspecified: Secondary | ICD-10-CM

## 2016-01-26 DIAGNOSIS — R42 Dizziness and giddiness: Secondary | ICD-10-CM

## 2016-01-26 DIAGNOSIS — F319 Bipolar disorder, unspecified: Secondary | ICD-10-CM | POA: Insufficient documentation

## 2016-01-26 MED ORDER — LORAZEPAM 1 MG PO TABS: 1.0000 mg | ORAL_TABLET | Freq: Three times a day (TID) | ORAL | Status: DC | PRN

## 2016-01-26 NOTE — Telephone Encounter (Signed)
Please Review

## 2016-01-26 NOTE — Patient Instructions (Signed)
Great to meet you!  Please go see your psychiatrist in the next 2 days and truy the samples they offered  Ativan is a temporary medication to help you through your current symptoms, You need better long term treatment

## 2016-01-26 NOTE — Progress Notes (Signed)
   HPI  Patient presents today .  Patient explains that over the last 3 weeks she's had nearly daily panic attacks described as episodes of  Racing heart and shaking that lat about 5 minutes. She explain that over thelat  Weks, she's been off of her  Medication due to not being able to afford it.   She was using Vraylar, an atypical antipsych  He has feelings of "being worthless" but states that she would not hurt herself. She has not had any SI, she states she would reach out to her husband if she did.   She has tried several meds from her psychiatrist, She was oferd samples but isnt sure what they thought of.   She has had these symptoms since being off of her medications.   PMH: Smoking status noted ROS: Per HPI  Objective: There were no vitals taken for this visit. Gen: NAD, alert, cooperative with exam HEENT: NCAT CV: RRR, good S1/S2, no murmur Resp: CTABL, no wheezes, non-labored Ext: No edema, warm Neuro: Alert and oriented, No gross deficits  Psych: crying at times, normal affect, depressed mood   MDQ- Screen positive  Assessment and plan:  # Bipolar disorder, Leaning toward mania Temporize with benzodiazepines, pointed her back to her psychiatrist who has samples for her to try until they straighten out insuranbce coverage Contracts for safety and denies SI currently.   Laroy Apple, MD [facility] 01/26/2016, 8:19 AM

## 2016-01-27 DIAGNOSIS — F3181 Bipolar II disorder: Secondary | ICD-10-CM | POA: Diagnosis not present

## 2016-01-27 DIAGNOSIS — F4310 Post-traumatic stress disorder, unspecified: Secondary | ICD-10-CM | POA: Diagnosis not present

## 2016-01-27 DIAGNOSIS — G4730 Sleep apnea, unspecified: Secondary | ICD-10-CM | POA: Diagnosis not present

## 2016-01-27 NOTE — Telephone Encounter (Signed)
Called and discussed results with pt, dizziness slightly improved, still constant daily problem though. Refer to ENT.

## 2016-02-06 ENCOUNTER — Encounter: Payer: Self-pay | Admitting: Pediatrics

## 2016-02-10 ENCOUNTER — Encounter: Payer: Self-pay | Admitting: Pediatrics

## 2016-02-10 DIAGNOSIS — H5210 Myopia, unspecified eye: Secondary | ICD-10-CM | POA: Diagnosis not present

## 2016-02-10 DIAGNOSIS — R42 Dizziness and giddiness: Secondary | ICD-10-CM | POA: Insufficient documentation

## 2016-02-10 DIAGNOSIS — H8113 Benign paroxysmal vertigo, bilateral: Secondary | ICD-10-CM | POA: Insufficient documentation

## 2016-02-10 DIAGNOSIS — R51 Headache: Secondary | ICD-10-CM | POA: Diagnosis not present

## 2016-02-10 DIAGNOSIS — E236 Other disorders of pituitary gland: Secondary | ICD-10-CM

## 2016-02-12 ENCOUNTER — Other Ambulatory Visit (INDEPENDENT_AMBULATORY_CARE_PROVIDER_SITE_OTHER): Payer: Commercial Managed Care - HMO

## 2016-02-12 DIAGNOSIS — E236 Other disorders of pituitary gland: Secondary | ICD-10-CM | POA: Diagnosis not present

## 2016-02-12 NOTE — Progress Notes (Signed)
LAB ONLY 

## 2016-02-13 LAB — TSH: TSH: 0.837 u[IU]/mL (ref 0.450–4.500)

## 2016-02-13 LAB — CORTISOL: CORTISOL: 5.9 ug/dL

## 2016-02-16 ENCOUNTER — Encounter: Payer: Self-pay | Admitting: Pediatrics

## 2016-02-17 ENCOUNTER — Encounter: Payer: Self-pay | Admitting: Nurse Practitioner

## 2016-02-20 ENCOUNTER — Telehealth: Payer: Self-pay | Admitting: Nurse Practitioner

## 2016-02-20 ENCOUNTER — Ambulatory Visit: Payer: Commercial Managed Care - HMO | Admitting: Physical Therapy

## 2016-02-20 MED ORDER — OSELTAMIVIR PHOSPHATE 75 MG PO CAPS: 75.0000 mg | ORAL_CAPSULE | Freq: Every day | ORAL | Status: DC

## 2016-02-20 NOTE — Telephone Encounter (Signed)
Pt's daughter dx'd this morning and pt isn't having any symptoms and has taken Tamiflu in the past without any problems. Tamiflu rx sent to pharmacy per MMM.

## 2016-02-23 ENCOUNTER — Telehealth: Payer: Self-pay | Admitting: Nurse Practitioner

## 2016-02-23 DIAGNOSIS — F3160 Bipolar disorder, current episode mixed, unspecified: Secondary | ICD-10-CM

## 2016-02-23 NOTE — Telephone Encounter (Signed)
Patient informed that referral is in process

## 2016-02-23 NOTE — Telephone Encounter (Signed)
Referral made 

## 2016-02-24 ENCOUNTER — Ambulatory Visit: Payer: Commercial Managed Care - HMO | Attending: Pediatrics | Admitting: Physical Therapy

## 2016-02-24 ENCOUNTER — Encounter: Payer: Self-pay | Admitting: Physical Therapy

## 2016-02-24 DIAGNOSIS — R2681 Unsteadiness on feet: Secondary | ICD-10-CM | POA: Insufficient documentation

## 2016-02-24 DIAGNOSIS — R2689 Other abnormalities of gait and mobility: Secondary | ICD-10-CM | POA: Insufficient documentation

## 2016-02-24 DIAGNOSIS — H8110 Benign paroxysmal vertigo, unspecified ear: Secondary | ICD-10-CM | POA: Insufficient documentation

## 2016-02-24 DIAGNOSIS — H8112 Benign paroxysmal vertigo, left ear: Secondary | ICD-10-CM | POA: Insufficient documentation

## 2016-02-24 DIAGNOSIS — R42 Dizziness and giddiness: Secondary | ICD-10-CM | POA: Diagnosis not present

## 2016-02-24 NOTE — Patient Instructions (Signed)
Tip Card 1.The goal of habituation training is to assist in decreasing symptoms of vertigo, dizziness, or nausea provoked by specific head and body motions. 2.These exercises may initially increase symptoms; however, be persistent and work through symptoms. With repetition and time, the exercises will assist in reducing or eliminating symptoms. 3.Exercises should be stopped and discussed with the therapist if you experience any of the following: - Sudden change or fluctuation in hearing - New onset of ringing in the ears, or increase in current intensity - Any fluid discharge from the ear - Severe pain in neck or back - Extreme nausea  Sit to Side-Lying   Sit on edge of bed. Lie down onto the right side (1-2 pillows) and hold until dizziness stops, plus 20 seconds.  Return to sitting and wait until dizziness stops, plus 20 seconds.  Repeat to the left side. Repeat sequence 5 times per session. Do 2 sessions per day.

## 2016-02-24 NOTE — Therapy (Addendum)
[facility] [address] [place], [place], [address] Phone: [phone]   Fax:  [phone]  Physical Therapy Evaluation  Patient Details  Name: Rachael Allen MRN: [id number] Date of Birth: 06-15-1971 Referring Provider: Assunta Found, MD  Encounter Date: 02/24/2016      PT End of Session - 02/24/16 1301    Visit Number 1   Number of Visits 9  eval + 8 visits   Date for PT Re-Evaluation 03/25/16   Authorization Type [employer]    PT Start Time [id number]   PT Stop Time 1235   PT Time Calculation (min) 47 min   Activity Tolerance Patient tolerated treatment well   Behavior During Therapy [facility] for tasks assessed/performed      Past Medical History  Diagnosis Date  . Bipolar disorder ([place])   . Depression     Past Surgical History  Procedure Laterality Date  . Tonsillectomy and adenoidectomy    . Cesarean section      There were no vitals filed for this visit.  Visit Diagnosis:  BPPV (benign paroxysmal positional vertigo), left - Plan: PT plan of care cert/re-cert  Dizziness and giddiness - Plan: PT plan of care cert/re-cert      Subjective Assessment - 02/24/16 1153    Subjective "I'm here for vertigo. I've had it for about 3 months now. It's not quite as bad now as it was, but it's still there."    Pertinent History PMH significant for: depression, narcolepsy, , bipolar II disorder, migraines, fibromyalgia   Patient Stated Goals "For this dizziness to go away."   Currently in Pain? No/denies  occipital headache at times, per patient            [facility] PT Assessment - 02/24/16 0001    Assessment   Medical Diagnosis vertigo   Referring Provider Assunta Found, MD   Onset Date/Surgical Date 12/13/14   Precautions   Precautions None   Balance Screen   Has the patient fallen in the past 6 months No   Has the patient had a decrease in activity level because of a fear of falling?  No   Is the patient  reluctant to leave their home because of a fear of falling?  No   Home Environment   Living Environment Private residence   Living Arrangements Spouse/significant other;Children   Type of [place] to enter   Entrance Stairs-Number of Steps 6   Entrance Stairs-Rails Can reach both   [place] One level   [place] None   Prior Function   Level of Independence Independent   Vocation On disability   Coordination   Gross Motor Movements are Fluid and Coordinated Yes   Ambulation/Gait   Ambulation/Gait Yes   Ambulation/Gait Assistance 6: Modified independent (Device/Increase time)   Ambulation Distance (Feet) 100 Feet   Assistive device None   Gait Pattern Step-through pattern  turns en bloc   Ambulation Surface Level;Indoor   Gait Comments linear gait over level, indoor surfaces            Vestibular Assessment - 02/24/16 0001    Vestibular Assessment   General Observation "Dizziness is in the back side of my head when I lean back in the recliner or turn to one side. It's hard to lie down flat." Pt experienced "spinning" yesterday.. Accompanied by nausea "sometimes," per pt.   Symptom Behavior   Type of Dizziness Spinning  secondary "cloudiness"  Frequency of Dizziness every day   Duration of Dizziness "a minute or so"   Aggravating Factors Looking up to the ceiling;Lying supine;Sitting with head tilted back;Turning head quickly;Turning body quickly;Supine to sit;Sit to stand;Rolling to right;Rolling to left   Relieving Factors Head stationary   Occulomotor Exam   Occulomotor Alignment Normal   Spontaneous Absent   Gaze-induced Absent   Smooth Pursuits Intact  "kind of a headache when I do that," per pt   Saccades Intact   Comment L Head Thrust Test (+) for corrective saccade and symptomatic. Convergence appears normal.   Vestibulo-Occular Reflex   VOR 1 Head Only (x 1 viewing) Pt notes difficulty performing VOR due to sensation of  "heaviness" in head.   Positional Testing   Dix-Hallpike Dix-Hallpike Left;Dix-Hallpike Right   Dix-Hallpike Right   Dix-Hallpike Right Duration NA   Dix-Hallpike Right Symptoms No nystagmus   Dix-Hallpike Left   Dix-Hallpike Left Duration Using Frenzel lenses, noted nystamgus x30 seconds with 3-sec latency, pt-reported vertiginous symptoms for initial 10 seconds.      Dix-Hallpike Left Symptoms Upbeat, left rotatory nystagmus                Vestibular Treatment/Exercise - 02/24/16 0001    Vestibular Treatment/Exercise   Vestibular Treatment Provided Canalith Repositioning;Habituation   Canalith Repositioning Epley Manuever Left;Semont Procedure Left Posterior   Habituation Exercises Brandt Daroff    EPLEY MANUEVER LEFT   Number of Reps  2   Overall Response  Improved Symptoms    RESPONSE DETAILS LEFT Pt with residual motion sensitivity, but no true vertigo following second L Epley maneuver.   Semont Procedure Left Posterior   Number of Reps  1   Overall Response No change   Response Details  Performed L Epley x1 with (+) L Hallpike afterward. Performed horizontal head shaking x20 seconds, L PC Semont x1, then L Epley x1, after which L Hallpike was (-) and asmyptomatic.   Nestor Lewandowsky   Number of Reps  3   Symptom Description  < 5 seconds of 1/5 disequilibrium with sit > L sidelying and with R side lying > sit. Symptoms dissipated to 0/5 within 5 reps of [employer].               PT Education - 02/24/16 1245    Education provided Yes   Education Details PT eval findings, goals, and POC.  Initiated habituation HEP. See Pt Instructions for details.    Person(s) Educated Patient   Methods Explanation;Demonstration;Verbal cues;Handout   Comprehension Verbalized understanding;Returned demonstration          PT Short Term Goals - 02/24/16 1346    PT SHORT TERM GOAL #1   Title STG's = LTG's           PT Long Term Goals - 02/24/16 1346    PT LONG TERM  GOAL #1   Title Positional vertigo testing will be negative to indicate resolved BPPV.    (Target date: 03/23/16)   PT LONG TERM GOAL #2   Title Pt will decrease DHI score from 78 to < / = 60 to indicate significant decrease in pt-perceived disability due to dizziness.    (Target date: 03/23/16)   PT LONG TERM GOAL #3   Title Assess strength and balance, if indicated, after vertigo clears.    (Target date: 03/23/16)               Plan - 02/24/16 1308    Clinical Impression Statement  Pt is a 45 y/o F referred to vestibular PT to address vertigo. PMH significant for: depression, narcolepsy, , bipolar II disorder, migraines, fibromyalgia. PT evaluation reveals thr following impairments: (+) and symptomatic L Head Thrust Test; L Dix-Hallpike with L upbeating torsional nystagmus approx. 30 seconds duration (visualized using Frenzel lenses) with 3-sec latency accompanied by vertiginous symptoms for initial 10 seconds. Based on findings, unable to rule out L posterior canalithiasis and L-sided hypofunction.Therefore, treated with L Semont and L Epley with no symptoms or nystagmus (in room light) upon reassessment. Pt will benefit from skilled outpatient PT 2x/week for up to 4 weeks to address said impairments.     Pt will benefit from skilled therapeutic intervention in order to improve on the following deficits Abnormal gait;Dizziness;Pain;Other (comment);Decreased balance  headache pain will be monitored but not directly addressed due to nature of referral   Rehab Potential Good   Clinical Impairments Affecting Rehab Potential favorable response to initial treatment   PT Frequency 2x / week   PT Duration 4 weeks   PT Treatment/Interventions ADLs/Self Care Home Management;Canalith Repostioning;Vestibular;Neuromuscular re-education;Patient/family education;Functional mobility training;Stair training;Gait training;Therapeutic activities;Balance training;Therapeutic exercise   PT Next Visit Plan  Reassess BPPV and treat prn. Initiate x1 viewing and expand on vestibular HEP.   Consulted and Agree with Plan of Care Patient         Problem List Patient Active Problem List   Diagnosis Date Noted  . Bipolar disorder ([place]) 01/26/2016  . Depression 05/09/2014  . Narcolepsy 05/09/2014  . Migraines 04/19/2013  . Fibromyalgia muscle pain 03/07/2013  . PCOS (polycystic ovarian syndrome) 03/07/2013    Billie Ruddy, PT, DPT [facility] [address] [place], [place], [address] Phone: [phone]   Fax:  [phone] 02/24/2016, 2:01 PM  Name: Rachael Allen MRN: [id number] Date of Birth: 10-17-1971   Addendum: G Codes Functional Assessment Tool: Benson = 34 Functional Limitation: Self Care Current Status [phone]): CL Goal Status [id number]): CK  Billie Ruddy, PT, DPT [facility] [address] [place], [place], [address] Phone: [phone]   Fax:  [phone] 04/19/2016, 4:54 PM

## 2016-03-01 ENCOUNTER — Ambulatory Visit: Payer: Commercial Managed Care - HMO | Admitting: Physical Therapy

## 2016-03-01 DIAGNOSIS — R2681 Unsteadiness on feet: Secondary | ICD-10-CM | POA: Diagnosis not present

## 2016-03-01 DIAGNOSIS — R42 Dizziness and giddiness: Secondary | ICD-10-CM

## 2016-03-01 DIAGNOSIS — H8110 Benign paroxysmal vertigo, unspecified ear: Secondary | ICD-10-CM | POA: Diagnosis not present

## 2016-03-01 DIAGNOSIS — H8112 Benign paroxysmal vertigo, left ear: Secondary | ICD-10-CM | POA: Diagnosis not present

## 2016-03-01 DIAGNOSIS — R2689 Other abnormalities of gait and mobility: Secondary | ICD-10-CM | POA: Diagnosis not present

## 2016-03-01 NOTE — Patient Instructions (Signed)
Benign Positional Vertigo Vertigo is the feeling that you or your surroundings are moving when they are not. Benign positional vertigo is the most common form of vertigo. The cause of this condition is not serious (is benign). This condition is triggered by certain movements and positions (is positional). This condition can be dangerous if it occurs while you are doing something that could endanger you or others, such as driving.  CAUSES In many cases, the cause of this condition is not known. It may be caused by a disturbance in an area of the inner ear that helps your brain to sense movement and balance. This disturbance can be caused by a viral infection (labyrinthitis), head injury, or repetitive motion. RISK FACTORS This condition is more likely to develop in:  Women.  People who are 50 years of age or older. SYMPTOMS Symptoms of this condition usually happen when you move your head or your eyes in different directions. Symptoms may start suddenly, and they usually last for less than a minute. Symptoms may include:  Loss of balance and falling.  Feeling like you are spinning or moving.  Feeling like your surroundings are spinning or moving.  Nausea and vomiting.  Blurred vision.  Dizziness.  Involuntary eye movement (nystagmus). Symptoms can be mild and cause only slight annoyance, or they can be severe and interfere with daily life. Episodes of benign positional vertigo may return (recur) over time, and they may be triggered by certain movements. Symptoms may improve over time. DIAGNOSIS This condition is usually diagnosed by medical history and a physical exam of the head, neck, and ears. You may be referred to a health care provider who specializes in ear, nose, and throat (ENT) problems (otolaryngologist) or a provider who specializes in disorders of the nervous system (neurologist). You may have additional testing, including:  MRI.  A CT scan.  Eye movement tests. Your  health care provider may ask you to change positions quickly while he or she watches you for symptoms of benign positional vertigo, such as nystagmus. Eye movement may be tested with an electronystagmogram (ENG), caloric stimulation, the Dix-Hallpike test, or the roll test.  An electroencephalogram (EEG). This records electrical activity in your brain.  Hearing tests. TREATMENT Usually, your health care provider will treat this by moving your head in specific positions to adjust your inner ear back to normal. Surgery may be needed in severe cases, but this is rare. In some cases, benign positional vertigo may resolve on its own in 2-4 weeks. HOME CARE INSTRUCTIONS Safety  Move slowly.Avoid sudden body or head movements.  Avoid driving.  Avoid operating heavy machinery.  Avoid doing any tasks that would be dangerous to you or others if a vertigo episode would occur.  If you have trouble walking or keeping your balance, try using a cane for stability. If you feel dizzy or unstable, sit down right away.  Return to your normal activities as told by your health care provider. Ask your health care provider what activities are safe for you. General Instructions  Take over-the-counter and prescription medicines only as told by your health care provider.  Avoid certain positions or movements as told by your health care provider.  Drink enough fluid to keep your urine clear or pale yellow.  Keep all follow-up visits as told by your health care provider. This is important. SEEK MEDICAL CARE IF:  You have a fever.  Your condition gets worse or you develop new symptoms.  Your family or friends   notice any behavioral changes.  Your nausea or vomiting gets worse.  You have numbness or a "pins and needles" sensation. SEEK IMMEDIATE MEDICAL CARE IF:  You have difficulty speaking or moving.  You are always dizzy.  You faint.  You develop severe headaches.  You have weakness in your  legs or arms.  You have changes in your hearing or vision.  You develop a stiff neck.  You develop sensitivity to light.   This information is not intended to replace advice given to you by your health care provider. Make sure you discuss any questions you have with your health care provider.   Document Released: 09/06/2006 Document Revised: 08/20/2015 Document Reviewed: 03/24/2015 Elsevier Interactive Patient Education 2016 [employer].  Habituation Tip Card 1.The goal of habituation training is to assist in decreasing symptoms of vertigo, dizziness, or nausea provoked by specific head and body motions. 2.These exercises may initially increase symptoms; however, be persistent and work through symptoms. With repetition and time, the exercises will assist in reducing or eliminating symptoms. 3.Exercises should be stopped and discussed with the therapist if you experience any of the following: - Sudden change or fluctuation in hearing - New onset of ringing in the ears, or increase in current intensity - Any fluid discharge from the ear - Severe pain in neck or back - Extreme nausea  Copyright  VHI. All rights reserved.  Rolling   With pillow under head, start on back. Roll to your right side.  Hold until dizziness stops, plus 20 seconds and then roll to the left side.  Hold until dizziness stops, plus 20 seconds.  Repeat sequence 5 times per session. Do 2 sessions per day.  Copyright  VHI. All rights reserved.  Sit to Side-Lying   Sit on edge of bed. Lie down onto the right side, turn head toward ceiling, and hold until dizziness stops, plus 20 seconds.  Return to sitting and wait until dizziness stops, plus 20 seconds.  Repeat to the left side. Repeat sequence 5 times per session. Do 2 sessions per day.  Copyright  VHI. All rights reserved.  Gaze Stabilization: Tip Card 1.Target must remain in focus, not blurry, and appear stationary while head is in motion. 2.Perform  exercises with small head movements (45 to either side of midline). 3.Increase speed of head motion so long as target is in focus. 4.If you wear eyeglasses, be sure you can see target through lens (therapist will give specific instructions for bifocal / progressive lenses). 5.These exercises may provoke dizziness or nausea. Work through these symptoms. If too dizzy, slow head movement slightly. Rest between each exercise. 6.Exercises demand concentration; avoid distractions. 7.For safety, perform standing exercises close to a counter, wall, corner, or next to someone.  Copyright  VHI. All rights reserved.  Gaze Stabilization: Standing Feet Apart   Feet shoulder width apart (stand next to stable surface at first), keeping eyes on target on wall 3 feet away, tilt head down slightly and move head side to side for 30 seconds. Repeat while moving head up and down for 30 seconds. Do 2 sessions per day while wearing eyeglasses.   Copyright  VHI. All rights reserved.

## 2016-03-01 NOTE — Therapy (Signed)
[facility] [address] [place], [place], [address] Phone: [phone]   Fax:  [phone]  Physical Therapy Treatment  Patient Details  Name: Rachael Allen MRN: [id number] Date of Birth: 10/28/1971 Referring Provider: Assunta Found, MD  Encounter Date: 03/01/2016      PT End of Session - 03/01/16 2145    Visit Number 2   Number of Visits 9   Date for PT Re-Evaluation 03/25/16   Authorization Type [employer]    PT Start Time 1108   PT Stop Time 1153   PT Time Calculation (min) 45 min   Activity Tolerance Patient tolerated treatment well   Behavior During Therapy [facility] for tasks assessed/performed      Past Medical History  Diagnosis Date  . Bipolar disorder ([place])   . Depression     Past Surgical History  Procedure Laterality Date  . Tonsillectomy and adenoidectomy    . Cesarean section      There were no vitals filed for this visit.  Visit Diagnosis:  BPPV (benign paroxysmal positional vertigo), unspecified laterality  Dizziness and giddiness      Subjective Assessment - 03/01/16 1111    Subjective Pt reports dizziness has improved since PT evaluation, stating, "It's not as bad when I lay back in the recliner. I still feel it a little bit when I lay flat in bed. The exercises you gave me aren't really bothering me. I hope I'm doing them the right way."   Pertinent History PMH significant for: depression, narcolepsy, , bipolar II disorder, migraines, fibromyalgia   Patient Stated Goals "For this dizziness to go away."   Currently in Pain? No/denies                Vestibular Assessment - 03/01/16 0001    Positional Testing   Dix-Hallpike Dix-Hallpike Right;Dix-Hallpike Left   Sidelying Test Sidelying Right;Sidelying Left   Horizontal Canal Testing Horizontal Canal Right;Horizontal Canal Left;Horizontal Canal Right Intensity;Horizontal Canal Left Intensity   Dix-Hallpike Right    Dix-Hallpike Right Duration Using Frenzel lenses, nystagmus approx. 35 seconds.   Dix-Hallpike Right Symptoms Upbeat, right rotatory nystagmus   Dix-Hallpike Left   Dix-Hallpike Left Duration NA  using Frenzel lenses   Dix-Hallpike Left Symptoms No nystagmus   Sidelying Right   Sidelying Right Duration NA   Sidelying Right Symptoms No nystagmus   Sidelying Left   Sidelying Left Duration NA   Sidelying Left Symptoms No nystagmus   Horizontal Canal Right   Horizontal Canal Right Duration NA   Horizontal Canal Right Symptoms Normal   Horizontal Canal Left   Horizontal Canal Left Duration Using Frenzels, able to visualize horizontal nystagmus for approx 20 seconds; however, difficult to visualize fast component of nystagmus. Accompanied by transient vertigo (< 10 seconds) followed by pt-reported lightheadedness.   Horizontal Canal Left Symptoms Nystagmus;Other (comment)  difficult to visualize fast component of nystagmus   Horizontal Canal Right Intensity   Right Intensity Comment NA; no nystagmus and no symptoms   Horizontal Canal Left Intensity   Horizontal Canal Left Intensity Mild   Positional Sensitivities   Positional Sensitivities Comments Sit > R/L sidelying: 2/10 in B direction. R/L sidelying > sit: 1/10 in B directions.                 [facility] Adult PT Treatment/Exercise - 03/01/16 0001    Self-Care   Self-Care Other Self-Care Comments   Other Self-Care Comments  Explained and provided paper handout for  rolling for HC habituation. Pt verbalized understanding.         Vestibular Treatment/Exercise - 03/01/16 0001    Vestibular Treatment/Exercise   Vestibular Treatment Provided Habituation   Canalith Repositioning Epley Manuever Right   Habituation Exercises [employer]   Gaze Exercises X1 Viewing Horizontal;X1 Viewing Vertical    EPLEY MANUEVER RIGHT   Number of Reps  2   Overall Response Improved Symptoms   Response Details  After initial R Epley,  reassessment of R Dix-Hallpike revealed R upbeating torsional nystagmus x10 seconds accompanied by vertiginous symptoms.    Nestor Lewandowsky   Number of Reps  2   Symptom Description  2/5 lightheadedness during initial rep (both R and L sidelying) which dissipated to 0/5 symptoms bilaterally with subsequent rep.   X1 Viewing Horizontal   Foot Position standing; shoulder width   Reps 2  2 x30 sec   Comments Cueing for technique (amplitude, velocity of head movement) with effective return demo   X1 Viewing Vertical   Foot Position standing; shoulder width   Reps 2  2 x30 sec   Comments Cueing for technique (amplitude, velocity of head movement) with effective return demo. Instructed pt to stand next to stable surface at home due to noted posterior LOB immediately after finishing initial trial.               PT Education - 03/01/16 2149    Education provided Yes   Education Details HEP: added x1 viewing and horizontal rolling for habituation.   Person(s) Educated Patient   Methods Explanation;Demonstration;Verbal cues;Handout   Comprehension Verbalized understanding;Returned demonstration          PT Short Term Goals - 02/24/16 1346    PT SHORT TERM GOAL #1   Title STG's = LTG's           PT Long Term Goals - 02/24/16 1346    PT LONG TERM GOAL #1   Title Positional vertigo testing will be negative to indicate resolved BPPV.    (Target date: 03/23/16)   PT LONG TERM GOAL #2   Title Pt will decrease DHI score from 78 to < / = 60 to indicate significant decrease in pt-perceived disability due to dizziness.    (Target date: 03/23/16)   PT LONG TERM GOAL #3   Title Assess strength and balance, if indicated, after vertigo clears.    (Target date: 03/23/16)               Plan - 03/01/16 2146    Clinical Impression Statement Skilled session focused on continued assessment and treatment of BPPV. L posterior canalithiasis appears to have cleared, as exhibited by L  Dix-Hallpike negative and asymptomatic. With R Dix-Hallpike (with Frenzel lenses), noted R upbeating torsional nystagmus x35 sec accompanied by vertiginous symptoms. Also, with L rolling, noted horizontal nystagmus (difficult to visualize fast component) x20 sec duration with vertiginous symptoms for initial 10 sec. Based on findings, unable to rule out R posterior canalithiasis and L horizontal canal BPPV. Therefore, treated with R Epley maneuver x2 with improved symptoms; instructed pt to continue Nestor Lewandowsky (with increased cervical spine rotation) at home for habituation. Also added x1 viewing and horizontal rolling for habituation to HEP.    Pt will benefit from skilled therapeutic intervention in order to improve on the following deficits Abnormal gait;Dizziness;Pain;Other (comment);Decreased balance  headache pain will be monitored but not directly addressed due to nature of referral   Rehab Potential Good   Clinical Impairments  Affecting Rehab Potential favorable response to initial treatment   PT Frequency 2x / week   PT Duration 4 weeks   PT Treatment/Interventions ADLs/Self Care Home Management;Canalith Repostioning;Vestibular;Neuromuscular re-education;Patient/family education;Functional mobility training;Stair training;Gait training;Therapeutic activities;Balance training;Therapeutic exercise   PT Next Visit Plan Reassess BPPV (R posterior canal, L HC) and treat prn. Assess pt performance of x1 viewing. Progress vestib HEP,  as appropriate.   Consulted and Agree with Plan of Care Patient        Problem List Patient Active Problem List   Diagnosis Date Noted  . Bipolar disorder ([place]) 01/26/2016  . Depression 05/09/2014  . Narcolepsy 05/09/2014  . Migraines 04/19/2013  . Fibromyalgia muscle pain 03/07/2013  . PCOS (polycystic ovarian syndrome) 03/07/2013   Billie Ruddy, PT, DPT [facility] [address] [place], [place],  [address] Phone: [phone]   Fax:  [phone] 03/01/2016, 10:01 PM   Name: SILLER MALLER MRN: [id number] Date of Birth: [birth year]

## 2016-03-05 ENCOUNTER — Ambulatory Visit: Payer: Commercial Managed Care - HMO | Admitting: Physical Therapy

## 2016-03-05 DIAGNOSIS — R2681 Unsteadiness on feet: Secondary | ICD-10-CM | POA: Diagnosis not present

## 2016-03-05 DIAGNOSIS — H8110 Benign paroxysmal vertigo, unspecified ear: Secondary | ICD-10-CM | POA: Diagnosis not present

## 2016-03-05 DIAGNOSIS — R42 Dizziness and giddiness: Secondary | ICD-10-CM

## 2016-03-05 DIAGNOSIS — R2689 Other abnormalities of gait and mobility: Secondary | ICD-10-CM | POA: Diagnosis not present

## 2016-03-05 DIAGNOSIS — H8112 Benign paroxysmal vertigo, left ear: Secondary | ICD-10-CM | POA: Diagnosis not present

## 2016-03-05 NOTE — Therapy (Signed)
[facility] [address] [place], [place], [address] Phone: [phone]   Fax:  [phone]  Physical Therapy Treatment  Patient Details  Name: Rachael Allen MRN: [id number] Date of Birth: 1971-09-07 Referring Provider: Assunta Found, MD  Encounter Date: 03/05/2016      PT End of Session - 03/05/16 1259    Visit Number 3   Number of Visits 9   Date for PT Re-Evaluation 03/25/16   Authorization Type [employer]    PT Start Time [id number]   PT Stop Time 1242   PT Time Calculation (min) 57 min   Activity Tolerance Patient tolerated treatment well   Behavior During Therapy [facility] for tasks assessed/performed      Past Medical History  Diagnosis Date  . Bipolar disorder ([place])   . Depression     Past Surgical History  Procedure Laterality Date  . Tonsillectomy and adenoidectomy    . Cesarean section      There were no vitals filed for this visit.  Visit Diagnosis:  BPPV (benign paroxysmal positional vertigo), unspecified laterality  Dizziness and giddiness      Subjective Assessment - 03/05/16 1150    Subjective Pt reports dizziness is worse, stating, "I almost fell this morning; I was putting up some lights up in my daughter's room. I looked up and lost my balance." Reports ongoing lightheadedness when getting OOB. When asked if she's been welll-hydrated, pt replied, "I don't drink as much water as I should."   Pertinent History PMH significant for: depression, narcolepsy, , bipolar II disorder, migraines, fibromyalgia   Patient Stated Goals "For this dizziness to go away."   Currently in Pain? No/denies                Vestibular Assessment - 03/05/16 0001    Positional Testing   Dix-Hallpike Dix-Hallpike Right;Dix-Hallpike Left   Sidelying Test Sidelying Right;Sidelying Left   Horizontal Canal Testing Horizontal Canal Right;Horizontal Canal Left   Dix-Hallpike Right   Dix-Hallpike Right Duration 52  seconds  using Frenzel lenses   Dix-Hallpike Right Symptoms Upbeat, right rotatory nystagmus   Dix-Hallpike Left   Dix-Hallpike Left Duration NA  lightheadedness only   Dix-Hallpike Left Symptoms No nystagmus   Sidelying Right   Sidelying Right Duration > 40 sec   Sidelying Right Symptoms Upbeat, right rotatory nystagmus   Sidelying Left   Sidelying Left Duration NA   Sidelying Left Symptoms No nystagmus   Horizontal Canal Right   Horizontal Canal Right Duration NA   Horizontal Canal Right Symptoms Normal   Horizontal Canal Left   Horizontal Canal Left Duration NA  lightheadedness only   Horizontal Canal Left Symptoms Normal   Horizontal Canal Right Intensity   Right Intensity Comment NA   Horizontal Canal Left Intensity   Left Intensity Comment NA; lightheadedness only   Orthostatics   BP supine (x 5 minutes) 143/88 mmHg   HR supine (x 5 minutes) 60   BP standing (after 1 minute) 123/97 mmHg  Pt reports concordant lightheadedness   HR standing (after 1 minute) 77   BP standing (after 3 minutes) 128/90 mmHg   HR standing (after 3 minutes) 67                 [facility] Adult PT Treatment/Exercise - 03/05/16 0001    Self-Care   Self-Care Other Self-Care Comments   Other Self-Care Comments  Discussed strategies for preventing, managing orthostatic hypotension with focus on hydration, postural adjustment,  compression garments, and performance of LE therex. Emphasized importance of static standing > 10 seconds prior to initiating ambulation to decrease fall risk. Pt verbalized understanding.         Vestibular Treatment/Exercise - 03/05/16 0001    Vestibular Treatment/Exercise   Vestibular Treatment Provided Gaze;Canalith Repositioning   Canalith Repositioning Epley Manuever Right;Semont Procedure Right Posterior   Gaze Exercises X1 Viewing Horizontal;X1 Viewing Vertical    EPLEY MANUEVER RIGHT   Number of Reps  1   Overall Response Symptoms Resolved   Response  Details  Performed R Epley x1 after R PC Semont x2. Following R Epley x1, R Sidelying Test (-) and asymptomatic.   Semont Procedure Right Posterior   Number of Reps  2   Overall Response  Symptoms Worsened   X1 Viewing Horizontal   Foot Position standing   Reps 1   Comments x45 seconds with cueing for decreased velocity, increased amplitude of head movement   X1 Viewing Vertical   Foot Position standing   Reps 1   Comments x45 seconds with cueing for decreased velocity, increased amplitude of head movement               PT Education - 03/05/16 1250    Education provided Yes   Education Details Education on postural hypotension; see Self Care for details.  HEP: removed Nestor Lewandowsky due to postural hypotension; progressed gaze.   Person(s) Educated Patient   Methods Explanation;Demonstration;Verbal cues;Handout   Comprehension Verbalized understanding;Returned demonstration          PT Short Term Goals - 02/24/16 1346    PT SHORT TERM GOAL #1   Title STG's = LTG's           PT Long Term Goals - 02/24/16 1346    PT LONG TERM GOAL #1   Title Positional vertigo testing will be negative to indicate resolved BPPV.    (Target date: 03/23/16)   PT LONG TERM GOAL #2   Title Pt will decrease DHI score from 78 to < / = 60 to indicate significant decrease in pt-perceived disability due to dizziness.    (Target date: 03/23/16)   PT LONG TERM GOAL #3   Title Assess strength and balance, if indicated, after vertigo clears.    (Target date: 03/23/16)               Plan - 03/05/16 1300    Clinical Impression Statement Skilled session continued to focus on treatment of BPPV. 1) Nystagmus no longer noted with horizontal canal testing, but pt does continue to report lightheadedness with horizontal rolling. Said lightheadedness decreases with rolling for habituation; recommended pt continue rolling for habituation. 2) Positional testing of L posterior canal evokes lightheadedness  but no nystagmus noted during tihs session. 3) R Marye Round reveals R upbeating nystagmus x52 seconds (when Frenzel lenses used). Performed R PC Semont Maneuver x2 reps, then R Epley x1. Upon reasessment of R Sidelying Test, no symptoms or nystagmus present. 4) Orthostatic vitals perform to assess pt-perceived lightheadedness with supine > sit. Supine vitals: BP 143/88, HR, 60. Standing vitals: 123/97. Provided education on postural hypotension, management (focus on hydration), and safety. Will notify referring MD. Removed Nestor Lewandowsky from HEP due to BP findings.    Pt will benefit from skilled therapeutic intervention in order to improve on the following deficits Abnormal gait;Dizziness;Pain;Decreased balance   Rehab Potential Good   Clinical Impairments Affecting Rehab Potential favorable response to initial treatment   PT Frequency 2x /  week   PT Duration 4 weeks   PT Treatment/Interventions ADLs/Self Care Home Management;Canalith Repostioning;Vestibular;Neuromuscular re-education;Patient/family education;Functional mobility training;Stair training;Gait training;Therapeutic activities;Balance training;Therapeutic exercise   PT Next Visit Plan Reassess BPPV (R PC) and treat prn. If habituation still needed, reassess orthostatic vitals first. Progress vestib HEP,  as appropriate.   Consulted and Agree with Plan of Care Patient        Problem List Patient Active Problem List   Diagnosis Date Noted  . Bipolar disorder ([place]) 01/26/2016  . Depression 05/09/2014  . Narcolepsy 05/09/2014  . Migraines 04/19/2013  . Fibromyalgia muscle pain 03/07/2013  . PCOS (polycystic ovarian syndrome) 03/07/2013   Billie Ruddy, PT, DPT [facility] [address] [place], [place], [address] Phone: [phone]   Fax:  [phone] 03/05/2016, 1:10 PM   Name: Rachael Allen MRN: [id number] Date of Birth: 08-Dec-1971

## 2016-03-05 NOTE — Patient Instructions (Addendum)
, don't do sit to/from side lying exercise at this time (due to significant drop in BP with positional change today).  Habituation Tip Card 1.The goal of habituation training is to assist in decreasing symptoms of vertigo, dizziness, or nausea provoked by specific head and body motions. 2.These exercises may initially increase symptoms; however, be persistent and work through symptoms. With repetition and time, the exercises will assist in reducing or eliminating symptoms. 3.Exercises should be stopped and discussed with the therapist if you experience any of the following: - Sudden change or fluctuation in hearing - New onset of ringing in the ears, or increase in current intensity - Any fluid discharge from the ear - Severe pain in neck or back - Extreme nausea  Copyright  VHI. All rights reserved.  Rolling   With pillow under head, start on back. Roll to your right side. Hold until dizziness stops, plus 20 seconds and then roll to the left side. Hold until dizziness stops, plus 20 seconds. Repeat sequence 5 times per session. Do 2 sessions per day.  Copyright  VHI. All rights reserved.  Gaze Stabilization: Tip Card 1.Target must remain in focus, not blurry, and appear stationary while head is in motion. 2.Perform exercises with small head movements (45 to either side of midline). 3.Increase speed of head motion so long as target is in focus. 4.If you wear eyeglasses, be sure you can see target through lens (therapist will give specific instructions for bifocal / progressive lenses). 5.These exercises may provoke dizziness or nausea. Work through these symptoms. If too dizzy, slow head movement slightly. Rest between each exercise. 6.Exercises demand concentration; avoid distractions. 7.For safety, perform standing exercises close to a counter, wall, corner, or next to someone.  Copyright  VHI. All rights reserved.  Gaze Stabilization: Standing Feet Apart   Feet shoulder  width apart (stand next to stable surface at first), keeping eyes on target on wall 3-5 feet away, tilt head down slightly and move head side to side for 45 seconds. Repeat while moving head up and down for 45 seconds. Do 2 sessions per day while wearing eyeglasses.

## 2016-03-08 ENCOUNTER — Ambulatory Visit: Payer: Commercial Managed Care - HMO | Admitting: Physical Therapy

## 2016-03-08 DIAGNOSIS — R2681 Unsteadiness on feet: Secondary | ICD-10-CM | POA: Diagnosis not present

## 2016-03-08 DIAGNOSIS — R2689 Other abnormalities of gait and mobility: Secondary | ICD-10-CM | POA: Diagnosis not present

## 2016-03-08 DIAGNOSIS — R42 Dizziness and giddiness: Secondary | ICD-10-CM

## 2016-03-08 DIAGNOSIS — H8110 Benign paroxysmal vertigo, unspecified ear: Secondary | ICD-10-CM | POA: Diagnosis not present

## 2016-03-08 DIAGNOSIS — H8112 Benign paroxysmal vertigo, left ear: Secondary | ICD-10-CM | POA: Diagnosis not present

## 2016-03-08 NOTE — Therapy (Signed)
[facility] [address] [place], [place], [address] Phone: [phone]   Fax:  [phone]  Physical Therapy Treatment  Patient Details  Name: Rachael Allen MRN: [id number] Date of Birth: [birth year] Referring Provider: Assunta Found, MD  Encounter Date: 03/08/2016      PT End of Session - 03/08/16 2027    Visit Number 4   Number of Visits 9   Date for PT Re-Evaluation 03/25/16   Authorization Type [employer]    PT Start Time 1101   PT Stop Time 1148   PT Time Calculation (min) 47 min   Equipment Utilized During Treatment Other (comment)  Balance Master and harness   Activity Tolerance Patient tolerated treatment well   Behavior During Therapy [facility] for tasks assessed/performed      Past Medical History  Diagnosis Date  . Bipolar disorder ([place])   . Depression     Past Surgical History  Procedure Laterality Date  . Tonsillectomy and adenoidectomy    . Cesarean section      Filed Vitals:    Visit Diagnosis:  BPPV (benign paroxysmal positional vertigo), unspecified laterality  Dizziness and giddiness      Subjective Assessment - 03/08/16 1102    Subjective "The spinning dizziness is better. The lightheadedness is the same." Pt is now only experiencing dizziness/lightheadedness when getting into/out of bed. Has been drinking a lot of water since last session.    Pertinent History PMH significant for: depression, narcolepsy, bipolar II disorder, migraines, fibromyalgia   Patient Stated Goals "For this dizziness to go away."   Currently in Pain? No/denies                Vestibular Assessment - 03/08/16 0001    Dix-Hallpike Right   Dix-Hallpike Right Duration Using Frenzels, < 10 seconds nystagmus accompanied by vertiginous symptoms.   Dix-Hallpike Right Symptoms Upbeat, right rotatory nystagmus   Dix-Hallpike Left   Dix-Hallpike Left Duration Nystagmus approx. 15 seconds accompanied by  symptoms.   Dix-Hallpike Left Symptoms Upbeat, left rotatory nystagmus   Horizontal Canal Right   Horizontal Canal Right Duration NA   Horizontal Canal Right Symptoms Normal   Horizontal Canal Left   Horizontal Canal Left Duration NA   Horizontal Canal Left Symptoms Normal   Equities trader Comment SOT compsosite score = 72%, which is above normative value of 70% for age/height. Ability to use visual, somatosensory, and vestibular input are all WNL.   Orthostatics   BP supine (x 5 minutes) 117/87 mmHg   HR supine (x 5 minutes) 79   BP standing (after 1 minute) 109/88 mmHg  mild lightheadedness; resolved within 10 sec   HR standing (after 1 minute) 113   BP standing (after 3 minutes) 110/72 mmHg   HR standing (after 3 minutes) 98                  Vestibular Treatment/Exercise - 03/08/16 0001    Vestibular Treatment/Exercise   Vestibular Treatment Provided Habituation   Canalith Repositioning Epley Manuever Right   Habituation Exercises Comment  R and L Dix-Hallpike for habituation    EPLEY MANUEVER RIGHT   Number of Reps  1   Overall Response Improved Symptoms   Response Details  Minimal symptoms (< 5 sec) and no nystagmus upon reassessment of R Dix-Hallpike               PT Education - 03/08/16 2025  Education provided Yes   Education Details SOT findings and functional implications.  HEP modified to L and R Dix-Hallpike for habituation.   Person(s) Educated Patient   Methods Explanation;Demonstration;Verbal cues;Handout   Comprehension Verbalized understanding;Returned demonstration          PT Short Term Goals - 02/24/16 1346    PT SHORT TERM GOAL #1   Title STG's = LTG's           PT Long Term Goals - 02/24/16 1346    PT LONG TERM GOAL #1   Title Positional vertigo testing will be negative to indicate resolved BPPV.    (Target date: 03/23/16)   PT LONG TERM GOAL #2   Title Pt will  decrease DHI score from 78 to < / = 60 to indicate significant decrease in pt-perceived disability due to dizziness.    (Target date: 03/23/16)   PT LONG TERM GOAL #3   Title Assess strength and balance, if indicated, after vertigo clears.    (Target date: 03/23/16)               Plan - 03/08/16 2028    Clinical Impression Statement Session continued to focus on assessing and addressing BPPV. Vital signs no longer suggest postural hypotension. Continue to note R upbeating torsional nystagmus (with latency) with R Dix-Hallpike and L upbeating torsional nystagmus with L Dix-Hallpike. Unable to consistently reproduce symptoms with Sidelying Tests. Therefore, modified HEP to L and R Dix-Hallpike Tests for habituation. SOT revealed ability to utilize visual, vestibular, and somatosensory input for balance all WNL for pt age/height. Continue per POC.   Pt will benefit from skilled therapeutic intervention in order to improve on the following deficits Abnormal gait;Dizziness;Pain;Decreased balance   Clinical Impairments Affecting Rehab Potential favorable response to initial treatment   PT Frequency 2x / week   PT Duration 4 weeks   PT Treatment/Interventions ADLs/Self Care Home Management;Canalith Repostioning;Vestibular;Neuromuscular re-education;Patient/family education;Functional mobility training;Stair training;Gait training;Therapeutic activities;Balance training;Therapeutic exercise   PT Next Visit Plan Reassess BPPV (R PC) and treat prn.   Consulted and Agree with Plan of Care Patient        Problem List Patient Active Problem List   Diagnosis Date Noted  . Bipolar disorder ([place]) 01/26/2016  . Depression 05/09/2014  . Narcolepsy 05/09/2014  . Migraines 04/19/2013  . Fibromyalgia muscle pain 03/07/2013  . PCOS (polycystic ovarian syndrome) 03/07/2013    Rachael Allen, PT, DPT [facility] [address] [place], [place], [address] Phone:  [phone]   Fax:  [phone] 03/08/2016, 8:33 PM  Name: Rachael Allen MRN: [id number] Date of Birth: [birth year]

## 2016-03-08 NOTE — Patient Instructions (Addendum)
Rachael Allen, you may stop doing the rolling exercise and the "A" exercise for now.    Stack 2-3 pillows at your lower back in position A. Turn head 45 degrees to right side and extend neck slightly (looking up toward ceiling) and lie down on your back, as in Position B. If you have symptoms, wait until symptoms go away, plus 20 seconds. Then come back up into position A. Hold until symptoms go away, then add 20 seconds.   Do this exercise 5 times with head turned to right side. Wait at least an hour, then perform 5 times with head turned to left side.  Tip Card 1.The goal of habituation training is to assist in decreasing symptoms of vertigo, dizziness, or nausea provoked by specific head and body motions. 2.These exercises may initially increase symptoms; however, be persistent and work through symptoms. With repetition and time, the exercises will assist in reducing or eliminating symptoms. Rachael Allen, stop and rest if symptoms exceed 4/10. 3.Exercises should be stopped and discussed with the therapist (call Benjie Karvonen) if you experience any of the following: - Sudden change or fluctuation in hearing - New onset of ringing in the ears, or increase in current intensity - Any fluid discharge from the ear - Severe pain in neck or back - Extreme nausea

## 2016-03-10 ENCOUNTER — Ambulatory Visit: Payer: Commercial Managed Care - HMO | Admitting: Physical Therapy

## 2016-03-10 DIAGNOSIS — R2689 Other abnormalities of gait and mobility: Secondary | ICD-10-CM | POA: Diagnosis not present

## 2016-03-10 DIAGNOSIS — R2681 Unsteadiness on feet: Secondary | ICD-10-CM

## 2016-03-10 DIAGNOSIS — R42 Dizziness and giddiness: Secondary | ICD-10-CM

## 2016-03-10 DIAGNOSIS — H8110 Benign paroxysmal vertigo, unspecified ear: Secondary | ICD-10-CM | POA: Diagnosis not present

## 2016-03-10 DIAGNOSIS — H8112 Benign paroxysmal vertigo, left ear: Secondary | ICD-10-CM | POA: Diagnosis not present

## 2016-03-10 NOTE — Therapy (Signed)
[facility] [address] [place], [place], [address] Phone: [phone]   Fax:  [phone]  Physical Therapy Treatment  Patient Details  Name: Rachael Allen MRN: [id number] Date of Birth: 09/12/1971 Referring Provider: Assunta Found, MD  Encounter Date: 03/10/2016      PT End of Session - 03/10/16 2110    Visit Number 5   Number of Visits 9   Date for PT Re-Evaluation 03/25/16   Authorization Type [employer]    PT Start Time 1100   PT Stop Time 1138   PT Time Calculation (min) 38 min   Activity Tolerance Patient tolerated treatment well   Behavior During Therapy [facility] for tasks assessed/performed      Past Medical History  Diagnosis Date  . Bipolar disorder ([place])   . Depression     Past Surgical History  Procedure Laterality Date  . Tonsillectomy and adenoidectomy    . Cesarean section      There were no vitals filed for this visit.  Visit Diagnosis:  Dizziness and giddiness  BPPV (benign paroxysmal positional vertigo), unspecified laterality  Other abnormalities of gait and mobility  Unsteadiness on feet      Subjective Assessment - 03/10/16 1103    Subjective "This morning I felt dizzy - I had been lying in bed with my head back - I didn't wait after I sat up; and I walked right into the fire place."   Pertinent History PMH significant for: depression, narcolepsy, bipolar II disorder, migraines, fibromyalgia   Patient Stated Goals "For this dizziness to go away."   Currently in Pain? No/denies            [facility] PT Assessment - 03/10/16 0001    Functional Gait  Assessment   Gait assessed  Yes   Gait Level Surface Walks 20 ft in less than 7 sec but greater than 5.5 sec, uses assistive device, slower speed, mild gait deviations, or deviates 6-10 in outside of the 12 in walkway width.  6.09 sec   Change in Gait Speed Able to smoothly change walking speed without loss of balance or gait  deviation. Deviate no more than 6 in outside of the 12 in walkway width.   Gait with Horizontal Head Turns Performs head turns smoothly with no change in gait. Deviates no more than 6 in outside 12 in walkway width   Gait with Vertical Head Turns Performs head turns with no change in gait. Deviates no more than 6 in outside 12 in walkway width.   Gait and Pivot Turn Pivot turns safely in greater than 3 sec and stops with no loss of balance, or pivot turns safely within 3 sec and stops with mild imbalance, requires small steps to catch balance.   Step Over Obstacle Is able to step over 2 stacked shoe boxes taped together (9 in total height) without changing gait speed. No evidence of imbalance.   Gait with Narrow Base of Support Ambulates less than 4 steps heel to toe or cannot perform without assistance.   Gait with Eyes Closed Walks 20 ft, uses assistive device, slower speed, mild gait deviations, deviates 6-10 in outside 12 in walkway width. Ambulates 20 ft in less than 9 sec but greater than 7 sec.  8.04 sec   Ambulating Backwards Walks 20 ft, uses assistive device, slower speed, mild gait deviations, deviates 6-10 in outside 12 in walkway width.   Steps Two feet to a stair, must use rail.  Total Score 21            Vestibular Assessment - 03/10/16 0001    Dix-Hallpike Right   Dix-Hallpike Right Duration NA   Dix-Hallpike Right Symptoms No nystagmus   Dix-Hallpike Left   Dix-Hallpike Left Duration < 5 sec nystagmus in room light   Dix-Hallpike Left Symptoms Upbeat, left rotatory nystagmus  symptoms and nystagmus resolved after habituation x5 reps   Positional Sensitivities   Right Hallpike Lightheadedness   Up from Right Hallpike Lightheadedness   Up from Left Hallpike Lightheadedness   Head Turning x 5 No dizziness   Head Nodding x 5 Lightheadedness   Positional Sensitivities Comments Left Hallpike: 2/5.                  Vestibular Treatment/Exercise - 03/10/16 0001     Vestibular Treatment/Exercise   Vestibular Treatment Provided Habituation   Habituation Exercises Standing Vertical Head Turns;Standing Diagonal Head Turns;Standing Horizontal Head Turns   Gaze Exercises Comment;X1 Viewing Vertical   Standing Horizontal Head Turns   Number of Reps  10   Symptom Description  narrow BOS   Standing Vertical Head Turns   Number of Reps  10   Symptom Description  narrow BOS   Standing Diagonal Head Turns   Number of Reps  10   Symptiom Description  narrow BOS   Eye/Head Exercise Vertical   Comment L Dix-Hallpike for habituation x5 reps with symptoms initially 2/5, which improved to 0/5 on last rep.                 PT Short Term Goals - 02/24/16 1346    PT SHORT TERM GOAL #1   Title STG's = LTG's           PT Long Term Goals - 03/10/16 2112    PT LONG TERM GOAL #1   Title Positional vertigo testing will be negative to indicate resolved BPPV.    (Target date: 03/23/16)   Status On-going   PT LONG TERM GOAL #2   Title Pt will decrease DHI score from 78 to < / = 60 to indicate significant decrease in pt-perceived disability due to dizziness.    (Target date: 03/23/16)   Status On-going   PT LONG TERM GOAL #3   Title Assess strength and balance, if indicated, after vertigo clears.    (Target date: 03/23/16)   Status Achieved   PT LONG TERM GOAL #4   Title Pt will improve FGA score from 21/30 to at least 23/30 to indicate decreased fall risk.  (Target date: 03/23/16)   Status New               Plan - 03/10/16 2115    Clinical Impression Statement Noted short-duration (< 5 seconds) of L upbeating torsional nystagmus with L Dix-Hallpike; nystagmus no longer present after habituation x3 reps. Pt continues to report motion sensitivity with B Dix-Hallpike positions, and vertical > horizontal head turns. Added head turns iin standing with narrow BOS to HEP.  Added goal for FGA, which was 21/30, suggesting fall risk. Continue per POC.    Pt  will benefit from skilled therapeutic intervention in order to improve on the following deficits Abnormal gait;Dizziness;Pain;Decreased balance   Rehab Potential Good   Clinical Impairments Affecting Rehab Potential favorable response to initial treatment   PT Frequency 2x / week   PT Duration 4 weeks   PT Treatment/Interventions ADLs/Self Care Home Management;Canalith Repostioning;Vestibular;Neuromuscular re-education;Patient/family education;Functional mobility training;Stair training;Gait training;Therapeutic activities;Balance  training;Therapeutic exercise   PT Next Visit Plan Ask about dizziness. Assess B Dix-Hallpike. Assess HEP for EC with narrow BOS, head turns. Consider DC vs decreasing frequency of PT to 1x/week.   Consulted and Agree with Plan of Care Patient        Problem List Patient Active Problem List   Diagnosis Date Noted  . Bipolar disorder ([place]) 01/26/2016  . Depression 05/09/2014  . Narcolepsy 05/09/2014  . Migraines 04/19/2013  . Fibromyalgia muscle pain 03/07/2013  . PCOS (polycystic ovarian syndrome) 03/07/2013    Billie Ruddy, PT, DPT [facility] [address] [place], [place], [address] Phone: [phone]   Fax:  [phone] 03/10/2016, 9:19 PM   Name: IZZI SOUTHERS MRN: [id number] Date of Birth: 06-19-1971

## 2016-03-10 NOTE — Patient Instructions (Addendum)
   Stack 2-3 pillows at your lower back in position A. Turn head 45 degrees to right side and extend neck slightly (looking up toward ceiling) and lie down on your back, as in Position B. If you have symptoms, wait until symptoms go away, plus 20 seconds. Then come back up into position A. Hold until symptoms go away, then add 20 seconds.   Do this exercise 5 times with head turned to right side. Wait at least an hour, then perform 5 times with head turned to left side.  Tip Card 1.The goal of habituation training is to assist in decreasing symptoms of vertigo, dizziness, or nausea provoked by specific head and body motions. 2.These exercises may initially increase symptoms; however, be persistent and work through symptoms. With repetition and time, the exercises will assist in reducing or eliminating symptoms. Glenard Haring, stop and rest if symptoms exceed 3/10. 3.Exercises should be stopped and discussed with the therapist (call Benjie Karvonen) if you experience any of the following: - Sudden change or fluctuation in hearing - New onset of ringing in the ears, or increase in current intensity - Any fluid discharge from the ear - Severe pain in neck or back - Extreme nausea  Feet Together, Head Motion - Eyes Open    Stand with your back to a corner with a stable chair in front of you. With eyes open, feet together, move head slowly: up and down 5 times; right to left 5 times; diagonally from up/right to down/left 5 times; and diagonally from up/left to down/right 5 times. If your symptoms increase to > 3/10, stop and rest before continuing.   Repeat __2-3__ times per day.

## 2016-03-16 ENCOUNTER — Encounter: Payer: Commercial Managed Care - HMO | Admitting: Physical Therapy

## 2016-03-17 ENCOUNTER — Ambulatory Visit: Payer: Commercial Managed Care - HMO | Attending: Pediatrics | Admitting: Physical Therapy

## 2016-03-17 DIAGNOSIS — R2681 Unsteadiness on feet: Secondary | ICD-10-CM | POA: Insufficient documentation

## 2016-03-17 DIAGNOSIS — R42 Dizziness and giddiness: Secondary | ICD-10-CM

## 2016-03-17 NOTE — Patient Instructions (Addendum)
   Stack 2-3 pillows at your lower back in position A. Turn head 45 degrees to right side and extend neck slightly (looking up toward ceiling) and lie down on your back, as in Position B. If you have symptoms, wait until symptoms go away, plus 20 seconds. Then come back up into position A. Hold until symptoms go away, then add 20 seconds.   Do this exercise 5 times, twice per day with head turned to right side.  Tip Card 1.The goal of habituation training is to assist in decreasing symptoms of vertigo, dizziness, or nausea provoked by specific head and body motions. 2.These exercises may initially increase symptoms; however, be persistent and work through symptoms. With repetition and time, the exercises will assist in reducing or eliminating symptoms. Glenard Haring, stop and rest if symptoms exceed 3/10. 3.Exercises should be stopped and discussed with the therapist (call Benjie Karvonen) if you experience any of the following: - Sudden change or fluctuation in hearing - New onset of ringing in the ears, or increase in current intensity - Any fluid discharge from the ear - Severe pain in neck or back - Extreme nausea  Feet Together (Compliant Surface) Head Motion - Eyes Closed   Stand with your back to a corner with a stable chair in front of you. Stand on 2 pillows with feet together. Close eyes and move head slowly: up and down 5 times; right to left 5 times; diagonally up/right to down/left 5 times; and diagonally up/left to down/right 5 times. Perform 2 times per day.  Walking with Diagonal Head Motion    Stand next to countertop, walk length of counter while turning head diagonally up/right to down/left. Walk the length of your countertop in this way 5 times (down and back), twice per day.

## 2016-03-17 NOTE — Therapy (Signed)
[facility] [address] [place], [place], [address] Phone: [phone]   Fax:  [phone]  Physical Therapy Treatment  Patient Details  Name: Rachael Allen MRN: [id number] Date of Birth: 09-07-1971 Referring Provider: Assunta Found, MD  Encounter Date: 03/17/2016      PT End of Session - 03/17/16 1237    Visit Number 6   Number of Visits 9   Date for PT Re-Evaluation 03/25/16   Authorization Type [employer]    PT Start Time 1150   PT Stop Time 1229   PT Time Calculation (min) 39 min   Activity Tolerance Patient tolerated treatment well   Behavior During Therapy [facility] for tasks assessed/performed      Past Medical History  Diagnosis Date  . Bipolar disorder ([place])   . Depression     Past Surgical History  Procedure Laterality Date  . Tonsillectomy and adenoidectomy    . Cesarean section      There were no vitals filed for this visit.  Visit Diagnosis:  Dizziness and giddiness  Unsteadiness on feet      Subjective Assessment - 03/17/16 1150    Subjective "Everytihng is better, except for when I got down (on back) with my head back and to the right. Also, when I do the exercises with the diagonal head turns (demonstrates looking up and to R side), that bothers me." Pt reports ongoing lightheadedness when getting OOB first thing in the morning.    Patient is accompained by: Family member  mother   Pertinent History PMH significant for: depression, narcolepsy, bipolar II disorder, migraines, fibromyalgia   Patient Stated Goals "For this dizziness to go away."   Currently in Pain? No/denies                Vestibular Assessment - 03/17/16 0001    Dix-Hallpike Left   Dix-Hallpike Left Duration Reports lightheadedness but no vertigo   Dix-Hallpike Left Symptoms No nystagmus   Orthostatics   BP supine (x 5 minutes) 108/77 mmHg   HR supine (x 5 minutes) 63   BP standing (after 1 minute) 117/79  mmHg  concordant "morning" lightheadedness   HR standing (after 1 minute) 90   BP standing (after 3 minutes) 106/89 mmHg   HR standing (after 3 minutes) 82                      Balance Exercises - 03/17/16 1210    Balance Exercises: Standing   Standing Eyes Opened Narrow base of support (BOS);Head turns;Solid surface;5 reps  horizontal, vertical, and diagonal head turns x10 w/o LOB   Standing Eyes Closed Wide (BOA);Foam/compliant surface;5 reps  1 pillow; horiz, vert., and diagonal head turns x5 each   Gait with Head Turns Forward;2 reps  w/ horiz, verti, and B diagonal head turns 2 x10' each           PT Education - 03/17/16 1209    Education provided Yes   Education Details HEP progressed; see Pt Instructions for details.  Decreased frequency of PT to 1x/week for remainder of POC due to pt progress.   Person(s) Educated Patient   Methods Explanation;Demonstration;Handout   Comprehension Verbalized understanding;Returned demonstration          PT Short Term Goals - 02/24/16 1346    PT SHORT TERM GOAL #1   Title STG's = LTG's           PT Long Term Goals -  03/17/16 1237    PT LONG TERM GOAL #1   Title Positional vertigo testing will be negative to indicate resolved BPPV.    (Target date: 03/23/16)   Baseline Met 4/5.   Status Achieved   PT LONG TERM GOAL #2   Title Pt will decrease DHI score from 78 to < / = 60 to indicate significant decrease in pt-perceived disability due to dizziness.    (Target date: 03/23/16)   Status On-going   PT LONG TERM GOAL #3   Title Assess strength and balance, if indicated, after vertigo clears.    (Target date: 03/23/16)   Status Achieved   PT LONG TERM GOAL #4   Title Pt will improve FGA score from 21/30 to at least 23/30 to indicate decreased fall risk.  (Target date: 03/23/16)   Status On-going               Plan - 03/17/16 1238    Clinical Impression Statement No nystagmus noted during R Dix-Hallpike  during this session; pt did report transient lightheadedness but no true vertigo. Due to pt report of lightheadedness upon sitting in bed first thing in morning, reassessed orthostatic vital signs, which were unremarkable but symptomatic for lightheadedness.  Progressed HEP and decreased PT frequency to 1x/week for remainder of POC. Pt in full agreement.    Pt will benefit from skilled therapeutic intervention in order to improve on the following deficits Abnormal gait;Dizziness;Pain;Decreased balance   Rehab Potential Good   Clinical Impairments Affecting Rehab Potential favorable response to initial treatment   PT Frequency 2x / week  1x/week for remainder of POC   PT Duration 4 weeks   PT Treatment/Interventions ADLs/Self Care Home Management;Canalith Repostioning;Vestibular;Neuromuscular re-education;Patient/family education;Functional mobility training;Stair training;Gait training;Therapeutic activities;Balance training;Therapeutic exercise   PT Next Visit Plan Assess remaining goals and DC. FOTO.   Consulted and Agree with Plan of Care Patient;Family member/caregiver   Family Member Consulted mother        Problem List Patient Active Problem List   Diagnosis Date Noted  . Bipolar disorder ([place]) 01/26/2016  . Depression 05/09/2014  . Narcolepsy 05/09/2014  . Migraines 04/19/2013  . Fibromyalgia muscle pain 03/07/2013  . PCOS (polycystic ovarian syndrome) 03/07/2013    Billie Ruddy, PT, DPT [facility] [address] [place], [place], [address] Phone: [phone]   Fax:  [phone] 03/17/2016, 12:41 PM  Name: Rachael Allen MRN: [id number] Date of Birth: [birth year]

## 2016-03-18 ENCOUNTER — Encounter: Payer: Commercial Managed Care - HMO | Admitting: Physical Therapy

## 2016-03-19 ENCOUNTER — Encounter: Payer: Commercial Managed Care - HMO | Admitting: Physical Therapy

## 2016-03-20 NOTE — Addendum Note (Signed)
Addended by: Billie Ruddy A on: 03/20/2016 02:25 PM   Modules accepted: Orders

## 2016-03-22 ENCOUNTER — Encounter: Payer: Commercial Managed Care - HMO | Admitting: Physical Therapy

## 2016-03-23 DIAGNOSIS — G4733 Obstructive sleep apnea (adult) (pediatric): Secondary | ICD-10-CM | POA: Diagnosis not present

## 2016-03-24 ENCOUNTER — Ambulatory Visit: Payer: Commercial Managed Care - HMO | Admitting: Physical Therapy

## 2016-03-24 DIAGNOSIS — R5383 Other fatigue: Secondary | ICD-10-CM | POA: Insufficient documentation

## 2016-03-24 DIAGNOSIS — G4733 Obstructive sleep apnea (adult) (pediatric): Secondary | ICD-10-CM | POA: Insufficient documentation

## 2016-03-24 DIAGNOSIS — Z9989 Dependence on other enabling machines and devices: Secondary | ICD-10-CM | POA: Diagnosis not present

## 2016-03-24 DIAGNOSIS — Z6841 Body Mass Index (BMI) 40.0 and over, adult: Secondary | ICD-10-CM | POA: Diagnosis not present

## 2016-04-01 ENCOUNTER — Encounter: Payer: Self-pay | Admitting: Physical Therapy

## 2016-04-01 NOTE — Therapy (Signed)
[facility] [address] [place], [place], [address] Phone: [phone]   Fax:  [phone]  Patient Details  Name: Rachael Allen MRN: [id number] Date of Birth: 06/01/1971 Referring Provider:  Harlan Stains, MD  Encounter Date: 04/01/2016   PHYSICAL THERAPY DISCHARGE SUMMARY  Visits from Start of Care: 6  Current functional level related to goals / functional outcomes: See below; final 2 goals not assessed, as patient did not return to final PT session due to pt feeling better.       PT Long Term Goals - 03/17/16 1237    PT LONG TERM GOAL #1   Title Positional vertigo testing will be negative to indicate resolved BPPV.    (Target date: 03/23/16)   Baseline Met 4/5.   Status Achieved   PT LONG TERM GOAL #2   Title Pt will decrease DHI score from 78 to < / = 60 to indicate significant decrease in pt-perceived disability due to dizziness.    (Target date: 03/23/16)   Status On-going   PT LONG TERM GOAL #3   Title Assess strength and balance, if indicated, after vertigo clears.    (Target date: 03/23/16)   Status Achieved   PT LONG TERM GOAL #4   Title Pt will improve FGA score from 21/30 to at least 23/30 to indicate decreased fall risk.  (Target date: 03/23/16)   Status On-going        Remaining deficits: Pt reports no remaining deficits, in reference to vertigo and pt-perceived imbalance.    Education / Equipment: Education on nature of BPPV, habituation, vestibular HEP and progression.  Plan: Patient agrees to discharge.  Patient goals were partially met. Patient is being discharged due to being pleased with the current functional level.  ?????       [place] office staff member contacted patient to reschedule last visit. Pt reports she is feeling much better and has had no further symptoms of vertigo. Therefore, pt agreeable to be discharged from outpatient PT at this time.    Billie Ruddy, PT, DPT [facility] [address] [place], [place], [address] Phone: [phone]   Fax:  [phone] 04/01/2016, 9:35 AM

## 2016-05-27 ENCOUNTER — Telehealth: Payer: Self-pay | Admitting: *Deleted

## 2016-05-27 ENCOUNTER — Telehealth: Payer: Self-pay | Admitting: Nurse Practitioner

## 2016-05-27 NOTE — Telephone Encounter (Signed)
What about her disability?

## 2016-05-27 NOTE — Telephone Encounter (Signed)
Dr. Merrie Roof called to speak with Dr. Milinda Pointer concerning pt's long-term disability limitations.

## 2016-06-09 NOTE — Telephone Encounter (Signed)
Will close encounter as pt hasn't returned call. 

## 2016-08-04 ENCOUNTER — Encounter: Payer: Commercial Managed Care - HMO | Admitting: *Deleted

## 2016-08-04 DIAGNOSIS — Z1231 Encounter for screening mammogram for malignant neoplasm of breast: Secondary | ICD-10-CM | POA: Diagnosis not present

## 2016-08-31 ENCOUNTER — Other Ambulatory Visit: Payer: Commercial Managed Care - HMO | Admitting: Nurse Practitioner

## 2016-09-29 ENCOUNTER — Telehealth: Payer: Self-pay | Admitting: Nurse Practitioner

## 2016-11-18 DIAGNOSIS — Z86010 Personal history of colonic polyps: Secondary | ICD-10-CM | POA: Diagnosis not present

## 2016-11-23 ENCOUNTER — Encounter: Payer: Self-pay | Admitting: Nurse Practitioner

## 2016-11-23 ENCOUNTER — Ambulatory Visit (INDEPENDENT_AMBULATORY_CARE_PROVIDER_SITE_OTHER): Payer: Commercial Managed Care - HMO | Admitting: Nurse Practitioner

## 2016-11-23 VITALS — BP 136/95 | HR 69 | Temp 97.1°F | Ht 64.0 in | Wt 261.0 lb

## 2016-11-23 DIAGNOSIS — Z01419 Encounter for gynecological examination (general) (routine) without abnormal findings: Secondary | ICD-10-CM

## 2016-11-23 DIAGNOSIS — G43719 Chronic migraine without aura, intractable, without status migrainosus: Secondary | ICD-10-CM

## 2016-11-23 DIAGNOSIS — Z0000 Encounter for general adult medical examination without abnormal findings: Secondary | ICD-10-CM

## 2016-11-23 DIAGNOSIS — E282 Polycystic ovarian syndrome: Secondary | ICD-10-CM

## 2016-11-23 DIAGNOSIS — F3160 Bipolar disorder, current episode mixed, unspecified: Secondary | ICD-10-CM

## 2016-11-23 DIAGNOSIS — M797 Fibromyalgia: Secondary | ICD-10-CM

## 2016-11-23 DIAGNOSIS — F3342 Major depressive disorder, recurrent, in full remission: Secondary | ICD-10-CM

## 2016-11-23 LAB — URINALYSIS, COMPLETE
BILIRUBIN UA: NEGATIVE
Glucose, UA: NEGATIVE
Ketones, UA: NEGATIVE
Leukocytes, UA: NEGATIVE
NITRITE UA: NEGATIVE
PH UA: 7 (ref 5.0–7.5)
PROTEIN UA: NEGATIVE
RBC UA: NEGATIVE
Specific Gravity, UA: 1.015 (ref 1.005–1.030)
UUROB: 0.2 mg/dL (ref 0.2–1.0)

## 2016-11-23 LAB — MICROSCOPIC EXAMINATION
Epithelial Cells (non renal): >10 /hpf — AB (ref 0–10)
RENAL EPITHEL UA: NONE SEEN /HPF

## 2016-11-23 MED ORDER — METFORMIN HCL ER 500 MG PO TB24: 500.0000 mg | ORAL_TABLET | Freq: Two times a day (BID) | ORAL | 1 refills | Status: DC

## 2016-11-23 NOTE — Patient Instructions (Signed)

## 2016-11-23 NOTE — Progress Notes (Signed)
Subjective:    Patient ID: Rachael Allen, female    DOB: [birth year], 45 y.o.   MRN: [id number]  HPI  Patient in today for annual physical exam and PAP. SHe is doing well today without complaints. Current medical problems include: - Bipolar disorder- she saw Dr. Wendi Snipes in [place] and he refilled her lorazepam and referred her back to psych. - fibromyalgia- currently not taking any medication for this- says she just deals with pain- has been having some problems with her disability, but trying to get that worked out. -depression- only takes lorazepam as needed. Depression screen [facility] 2/9 11/23/2016 01/26/2016 12/31/2015 12/26/2015 12/10/2015  Decreased Interest _0 0  Down, Depressed, Hopeless _1 0  PHQ - 2 Score _2 0  Altered sleeping 2 3 - 2 -  Tired, decreased energy 2 3 - 3 -  Change in appetite 2 3 - 0 -  Feeling bad or failure about yourself  1 3 - 2 -  Trouble concentrating 1 - - 2 -  Moving slowly or fidgety/restless 0 2 - 0 -  Suicidal thoughts 0 1 - 0 -  PHQ-9 Score 10 21 - 12 -  Difficult doing work/chores - - - Somewhat difficult -   PCOS- currently has in mirena with no problems.Takes metformin daily- does noit check blood sugars. -migraines- has couple times a month- takes fioricet with codeine which helps.  Review of Systems  Constitutional: Negative for diaphoresis.  Eyes: Negative for pain.  Respiratory: Negative for shortness of breath.   Cardiovascular: Negative for chest pain, palpitations and leg swelling.  Gastrointestinal: Negative for abdominal pain.  Endocrine: Negative for polydipsia.  Skin: Negative for rash.  Neurological: Negative for dizziness, weakness and headaches.  Hematological: Does not bruise/bleed easily.       Objective:   Physical Exam  Constitutional: She is oriented to person, place, and time. She appears well-developed and well-nourished.  HENT:  Head: Normocephalic.  Right Ear: Hearing, tympanic membrane, external  ear and ear canal normal.  Left Ear: Hearing, tympanic membrane, external ear and ear canal normal.  Nose: Nose normal.  Mouth/Throat: Uvula is midline and oropharynx is clear and moist.  Eyes: Conjunctivae and EOM are normal. Pupils are equal, round, and reactive to light.  Neck: Normal range of motion and full passive range of motion without pain. Neck supple. No JVD present. Carotid bruit is not present. No thyroid mass and no thyromegaly present.  Cardiovascular: Normal rate, normal heart sounds and intact distal pulses.   No murmur heard. Pulmonary/Chest: Effort normal and breath sounds normal. Right breast exhibits no inverted nipple, no mass, no nipple discharge, no skin change and no tenderness. Left breast exhibits no inverted nipple, no mass, no nipple discharge, no skin change and no tenderness.  Abdominal: Soft. Bowel sounds are normal. She exhibits no mass. There is no tenderness.  Genitourinary: Vagina normal and uterus normal. No breast swelling, tenderness, discharge or bleeding.  Genitourinary Comments: bimanual exam-No adnexal masses or tenderness. cervix parous and pink- mirenia string visible.  Musculoskeletal: Normal range of motion.  Lymphadenopathy:    She has no cervical adenopathy.  Neurological: She is alert and oriented to person, place, and time.  Skin: Skin is warm and dry.  Psychiatric: She has a normal mood and affect. Her behavior is normal. Judgment and thought content normal.   BP (!) 136/95   Pulse 69   Temp 97.1 F (36.2 C) (Oral)  Ht 5' 4" (1.626 m)   Wt 261 lb (118.4 kg)   BMI 44.80 kg/m      Assessment & Plan:  1. Annual physical exam - Urinalysis, Complete - CMP14+EGFR - CBC with Differential/Platelet - Lipid panel - Thyroid Panel With TSH  2. Encounter for gynecological examination without abnormal finding - IGP, Aptima HPV, rfx 16/18,45  3. Intractable chronic migraine without aura and without status migrainosus  4. PCOS  (polycystic ovarian syndrome) Need to get back on metformin - metFORMIN (GLUCOPHAGE-XR) 500 MG 24 hr tablet; Take 1 tablet (500 mg total) by mouth 2 (two) times daily.  Dispense: 180 tablet; Refill: 1  5. Fibromyalgia muscle pain Exercise daily to kep muscles warm  6. Bipolar affective disorder, current episode mixed, current episode severity unspecified (HCC) stress management  7. Recurrent major depressive disorder, in full remission (HCC)    Labs pending Health maintenance reviewed Diet and exercise encouraged Continue all meds Follow up  In 6 months   Mary-Margaret , FNP   

## 2016-11-24 LAB — CBC WITH DIFFERENTIAL/PLATELET
BASOS: 1 %
Basophils Absolute: 0.1 10*3/uL (ref 0.0–0.2)
EOS (ABSOLUTE): 0.2 10*3/uL (ref 0.0–0.4)
EOS: 3 %
HEMOGLOBIN: 14.6 g/dL (ref 11.1–15.9)
Hematocrit: 40.8 % (ref 34.0–46.6)
IMMATURE GRANULOCYTES: 0 %
Immature Grans (Abs): 0 10*3/uL (ref 0.0–0.1)
LYMPHS: 29 %
Lymphocytes Absolute: 2.4 10*3/uL (ref 0.7–3.1)
MCH: 28.6 pg (ref 26.6–33.0)
MCHC: 35.8 g/dL — ABNORMAL HIGH (ref 31.5–35.7)
MCV: 80 fL (ref 79–97)
MONOCYTES: 7 %
Monocytes Absolute: 0.5 10*3/uL (ref 0.1–0.9)
NEUTROS ABS: 4.9 10*3/uL (ref 1.4–7.0)
NEUTROS PCT: 60 %
Platelets: 270 10*3/uL (ref 150–379)
RBC: 5.1 x10E6/uL (ref 3.77–5.28)
RDW: 14.3 % (ref 12.3–15.4)
WBC: 8.1 10*3/uL (ref 3.4–10.8)

## 2016-11-24 LAB — THYROID PANEL WITH TSH
Free Thyroxine Index: 1.6 (ref 1.2–4.9)
T3 Uptake Ratio: 23 % — ABNORMAL LOW (ref 24–39)
T4 TOTAL: 6.8 ug/dL (ref 4.5–12.0)
TSH: 1.23 u[IU]/mL (ref 0.450–4.500)

## 2016-11-24 LAB — CMP14+EGFR
A/G RATIO: 1.7 (ref 1.2–2.2)
ALT: 20 IU/L (ref 0–32)
AST: 14 IU/L (ref 0–40)
Albumin: 4.2 g/dL (ref 3.5–5.5)
Alkaline Phosphatase: 62 IU/L (ref 39–117)
BUN/Creatinine Ratio: 11 (ref 9–23)
BUN: 10 mg/dL (ref 6–24)
Bilirubin Total: 0.3 mg/dL (ref 0.0–1.2)
CALCIUM: 9.3 mg/dL (ref 8.7–10.2)
CO2: 22 mmol/L (ref 18–29)
CREATININE: 0.87 mg/dL (ref 0.57–1.00)
Chloride: 102 mmol/L (ref 96–106)
GFR, EST AFRICAN AMERICAN: 92 mL/min/{1.73_m2} (ref >59)
GFR, EST NON AFRICAN AMERICAN: 80 mL/min/{1.73_m2} (ref >59)
Globulin, Total: 2.5 g/dL (ref 1.5–4.5)
Glucose: 94 mg/dL (ref 65–99)
Potassium: 4.2 mmol/L (ref 3.5–5.2)
Sodium: 142 mmol/L (ref 134–144)
TOTAL PROTEIN: 6.7 g/dL (ref 6.0–8.5)

## 2016-11-24 LAB — LIPID PANEL
Chol/HDL Ratio: 4.9 ratio units — ABNORMAL HIGH (ref 0.0–4.4)
Cholesterol, Total: 167 mg/dL (ref 100–199)
HDL: 34 mg/dL — AB (ref >39)
LDL CALC: 93 mg/dL (ref 0–99)
Triglycerides: 199 mg/dL — ABNORMAL HIGH (ref 0–149)
VLDL CHOLESTEROL CAL: 40 mg/dL (ref 5–40)

## 2016-11-26 LAB — IGP, APTIMA HPV, RFX 16/18,45
HPV Aptima: NEGATIVE
PAP Smear Comment: 0

## 2016-12-03 DIAGNOSIS — D225 Melanocytic nevi of trunk: Secondary | ICD-10-CM | POA: Diagnosis not present

## 2016-12-03 DIAGNOSIS — L578 Other skin changes due to chronic exposure to nonionizing radiation: Secondary | ICD-10-CM | POA: Diagnosis not present

## 2016-12-03 DIAGNOSIS — D2239 Melanocytic nevi of other parts of face: Secondary | ICD-10-CM | POA: Diagnosis not present

## 2016-12-03 DIAGNOSIS — L821 Other seborrheic keratosis: Secondary | ICD-10-CM | POA: Diagnosis not present

## 2017-02-04 ENCOUNTER — Other Ambulatory Visit: Payer: Self-pay | Admitting: *Deleted

## 2017-02-04 ENCOUNTER — Telehealth: Payer: Self-pay | Admitting: Nurse Practitioner

## 2017-02-04 MED ORDER — OSELTAMIVIR PHOSPHATE 75 MG PO CAPS: 75.0000 mg | ORAL_CAPSULE | Freq: Every day | ORAL | 0 refills | Status: DC

## 2017-02-04 NOTE — Telephone Encounter (Signed)
Patient's son was just diagnosed with flu, patient would like [place] to call Tamiflu in for her to [place].  Please advise.

## 2017-02-04 NOTE — Telephone Encounter (Signed)
What is the name of the medication? Tamiflu. Her son was diagnosed with the flu.  Have you contacted your pharmacy to request a refill? no  Which pharmacy would you like this sent to? [facility] in [place].   Patient notified that their request is being sent to the clinical staff for review and that they should receive a call once it is complete. If they do not receive a call within 24 hours they can check with their pharmacy or our office.

## 2017-02-05 ENCOUNTER — Other Ambulatory Visit: Payer: Self-pay | Admitting: Nurse Practitioner

## 2017-02-05 MED ORDER — OSELTAMIVIR PHOSPHATE 75 MG PO CAPS: 75.0000 mg | ORAL_CAPSULE | Freq: Two times a day (BID) | ORAL | 0 refills | Status: DC

## 2017-06-26 ENCOUNTER — Emergency Department (HOSPITAL_COMMUNITY)
Admission: EM | Admit: 2017-06-26 | Discharge: 2017-06-27 | Disposition: A | Discharge status: Home / Self Care | Payer: BLUE CROSS/BLUE SHIELD | Attending: Emergency Medicine | Admitting: Emergency Medicine

## 2017-06-26 ENCOUNTER — Encounter (HOSPITAL_COMMUNITY): Payer: Self-pay

## 2017-06-26 DIAGNOSIS — R1032 Left lower quadrant pain: Secondary | ICD-10-CM | POA: Diagnosis present

## 2017-06-26 DIAGNOSIS — K5792 Diverticulitis of intestine, part unspecified, without perforation or abscess without bleeding: Secondary | ICD-10-CM | POA: Diagnosis not present

## 2017-06-26 DIAGNOSIS — Z79899 Other long term (current) drug therapy: Secondary | ICD-10-CM | POA: Insufficient documentation

## 2017-06-26 DIAGNOSIS — R9431 Abnormal electrocardiogram [ECG] [EKG]: Secondary | ICD-10-CM | POA: Diagnosis not present

## 2017-06-26 DIAGNOSIS — Z91040 Latex allergy status: Secondary | ICD-10-CM | POA: Diagnosis not present

## 2017-06-26 DIAGNOSIS — F319 Bipolar disorder, unspecified: Secondary | ICD-10-CM | POA: Insufficient documentation

## 2017-06-26 DIAGNOSIS — K5720 Diverticulitis of large intestine with perforation and abscess without bleeding: Secondary | ICD-10-CM | POA: Insufficient documentation

## 2017-06-26 DIAGNOSIS — K5732 Diverticulitis of large intestine without perforation or abscess without bleeding: Secondary | ICD-10-CM | POA: Diagnosis not present

## 2017-06-26 LAB — CBC
HEMATOCRIT: 40 % (ref 36.0–46.0)
Hemoglobin: 13.6 g/dL (ref 12.0–15.0)
MCH: 28 pg (ref 26.0–34.0)
MCHC: 34 g/dL (ref 30.0–36.0)
MCV: 82.3 fL (ref 78.0–100.0)
PLATELETS: 262 10*3/uL (ref 150–400)
RBC: 4.86 MIL/uL (ref 3.87–5.11)
RDW: 13.5 % (ref 11.5–15.5)
WBC: 12.5 10*3/uL — AB (ref 4.0–10.5)

## 2017-06-26 LAB — COMPREHENSIVE METABOLIC PANEL
ALBUMIN: 3.9 g/dL (ref 3.5–5.0)
ALT: 16 U/L (ref 14–54)
AST: 17 U/L (ref 15–41)
Alkaline Phosphatase: 57 U/L (ref 38–126)
Anion gap: 9 (ref 5–15)
BUN: 8 mg/dL (ref 6–20)
CHLORIDE: 107 mmol/L (ref 101–111)
CO2: 20 mmol/L — AB (ref 22–32)
CREATININE: 0.85 mg/dL (ref 0.44–1.00)
Calcium: 8.6 mg/dL — ABNORMAL LOW (ref 8.9–10.3)
GFR calc Af Amer: >60 mL/min (ref >60)
GLUCOSE: 113 mg/dL — AB (ref 65–99)
POTASSIUM: 3.7 mmol/L (ref 3.5–5.1)
Sodium: 136 mmol/L (ref 135–145)
Total Bilirubin: 0.4 mg/dL (ref 0.3–1.2)
Total Protein: 6.7 g/dL (ref 6.5–8.1)

## 2017-06-26 LAB — I-STAT BETA HCG BLOOD, ED (MC, WL, AP ONLY): I-stat hCG, quantitative: <5 m[IU]/mL (ref <5)

## 2017-06-26 LAB — URINALYSIS, ROUTINE W REFLEX MICROSCOPIC
BILIRUBIN URINE: NEGATIVE
GLUCOSE, UA: NEGATIVE mg/dL
HGB URINE DIPSTICK: NEGATIVE
KETONES UR: NEGATIVE mg/dL
Leukocytes, UA: NEGATIVE
Nitrite: NEGATIVE
PH: 6 (ref 5.0–8.0)
PROTEIN: NEGATIVE mg/dL
Specific Gravity, Urine: 1.017 (ref 1.005–1.030)

## 2017-06-26 LAB — LIPASE, BLOOD: LIPASE: 26 U/L (ref 11–51)

## 2017-06-26 MED ORDER — IOPAMIDOL (ISOVUE-300) INJECTION 61%
INTRAVENOUS | Status: AC
  Administered 2017-06-27: 100 mL
  Filled 2017-06-26: qty 100

## 2017-06-26 MED ORDER — SODIUM CHLORIDE 0.9 % IV BOLUS (SEPSIS)
1000.0000 mL | Freq: Once | INTRAVENOUS | Status: AC
  Administered 2017-06-26: 1000 mL via INTRAVENOUS

## 2017-06-26 NOTE — ED Triage Notes (Signed)
Onset today lower abd cramping, pressure.  No urinary difficulties.  Painful to get up and down and walk.  No diarrhea, vomiting, fever.

## 2017-06-26 NOTE — ED Notes (Signed)
ED Provider at bedside. 

## 2017-06-26 NOTE — ED Provider Notes (Signed)
[facility] DEPT Provider Note   CSN: [id number] Arrival date & time: 06/26/17  2223     History   Chief Complaint Chief Complaint  Patient presents with  . Abdominal Pain    HPI  Blood pressure (!) 154/103, pulse 93, temperature 99.5 F (37.5 C), temperature source Oral, resp. rate 20, height 5\' 4"  (1.626 m), weight 115.7 kg (255 lb), SpO2 97 %.  Rachael Allen is a 46 y.o. female complaining of Left lower quadrant pain and also right lower quadrant pain onset this morning she has no fever, chills, nausea, vomiting, diarrhea, change in urination, melena, fever. She does believe that she's been told that she has a history of diverticulosis. She notes that she had an episode yesterday when she was at the mall with her daughter when she felt very faint, lightheaded and her  lips turned pale. She did not actually syncopiz, there is no palpitations, nausea associated chest pain. She took some naproxen at home with some relief.   Past Medical History:  Diagnosis Date  . Bipolar disorder ([place])   . Depression     Patient Active Problem List   Diagnosis Date Noted  . Bipolar disorder ([place]) 01/26/2016  . Depression 05/09/2014  . Narcolepsy 05/09/2014  . Migraines 04/19/2013  . Fibromyalgia muscle pain 03/07/2013  . PCOS (polycystic ovarian syndrome) 03/07/2013    Past Surgical History:  Procedure Laterality Date  . CESAREAN SECTION    . TONSILLECTOMY AND ADENOIDECTOMY      OB History    No data available       Home Medications    Prior to Admission medications   Medication Sig Start Date End Date Taking? Authorizing Provider  lurasidone (LATUDA) 40 MG TABS tablet Take 40 mg by mouth daily with breakfast.   Yes [provider]  Butalbital-APAP-Caff-Cod (FIORICET/CODEINE) 50-300-40-30 MG CAPS Take 1 capsule by mouth as needed. Patient not taking: Reported on 06/27/2017 04/19/13   Chevis Pretty, FNP  ciprofloxacin (CIPRO) 500 MG tablet Take 1 tablet  (500 mg total) by mouth every 12 (twelve) hours. 06/27/17   Jodilyn Giese, Elmyra Ricks, PA-C  fluconazole (DIFLUCAN) 200 MG tablet Take 1 tablet (200 mg total) by mouth once. 06/27/17 06/27/17  Keilani Terrance, Elmyra Ricks, PA-C  LORazepam (ATIVAN) 1 MG tablet Take 1 tablet (1 mg total) by mouth every 8 (eight) hours as needed (for MRI). Patient not taking: Reported on 06/27/2017 01/26/16   Timmothy Euler, MD  meclizine (ANTIVERT) 12.5 MG tablet Take 1 tablet (12.5 mg total) by mouth 3 (three) times daily as needed for dizziness. Patient not taking: Reported on 06/27/2017 12/26/15   Eustaquio Maize, MD  metFORMIN (GLUCOPHAGE-XR) 500 MG 24 hr tablet Take 1 tablet (500 mg total) by mouth 2 (two) times daily. Patient not taking: Reported on 06/27/2017 11/23/16   Chevis Pretty, FNP  metroNIDAZOLE (FLAGYL) 500 MG tablet Take 1 tablet (500 mg total) by mouth 3 (three) times daily. 06/27/17 07/07/17  Matteus Mcnelly, Elmyra Ricks, PA-C  oseltamivir (TAMIFLU) 75 MG capsule Take 1 capsule (75 mg total) by mouth daily. Patient not taking: Reported on 06/27/2017 02/04/17   Chevis Pretty, FNP  oseltamivir (TAMIFLU) 75 MG capsule Take 1 capsule (75 mg total) by mouth 2 (two) times daily. Patient not taking: Reported on 06/27/2017 02/05/17   Chevis Pretty, FNP  oxyCODONE-acetaminophen (PERCOCET) 5-325 MG tablet Take 1 tablet by mouth every 4 (four) hours as needed. 06/27/17   Keileigh Vahey, Elmyra Ricks, PA-C  valACYclovir (VALTREX) 1000 MG tablet TAKE 2 TABLETS TWICE  A DAY FOR 1 DAY THEN AS NEEDED FOR FEVER BLISTERS Patient not taking: Reported on 06/27/2017 02/05/15   Chevis Pretty, FNP    Family History Family History  Problem Relation Age of Onset  . Hypertension Mother   . Mental illness Father   . Hypertension Sister     Social History Social History  Substance Use Topics  . Smoking status: Never Smoker  . Smokeless tobacco: Never Used  . Alcohol use No     Allergies   Nitrofurantoin; Benadryl [diphenhydramine  hcl]; Dilaudid [hydromorphone hcl]; Latex; Nsaids; and Vicodin [hydrocodone-acetaminophen]   Review of Systems Review of Systems  A complete review of systems was obtained and all systems are negative except as noted in the HPI and PMH.    Physical Exam Updated Vital Signs BP 96/62   Pulse 73   Temp 99.5 F (37.5 C) (Oral)   Resp 18   Ht 5\' 4"  (1.626 m)   Wt 115.7 kg (255 lb)   SpO2 97%   BMI 43.77 kg/m   Physical Exam  Constitutional: She is oriented to person, place, and time. She appears well-developed and well-nourished. No distress.  HENT:  Head: Normocephalic and atraumatic.  Mouth/Throat: Oropharynx is clear and moist.  Eyes: Pupils are equal, round, and reactive to light. Conjunctivae and EOM are normal.  Neck: Normal range of motion.  Cardiovascular: Normal rate, regular rhythm and intact distal pulses.   Pulmonary/Chest: Effort normal and breath sounds normal.  Abdominal: Soft. She exhibits no distension and no mass. There is tenderness. There is no rebound and no guarding. No hernia.  Tender in the left upper quadrant and left lower quadrant, no guarding or rebound.  Hypoactive bowel sounds.  Musculoskeletal: Normal range of motion.  Neurological: She is alert and oriented to person, place, and time.  Skin: She is not diaphoretic.  Psychiatric: She has a normal mood and affect.  Nursing note and vitals reviewed.    ED Treatments / Results  Labs (all labs ordered are listed, but only abnormal results are displayed) Labs Reviewed  WET PREP, GENITAL - Abnormal; Notable for the following:       Result Value   Yeast Wet Prep HPF POC PRESENT (*)    WBC, Wet Prep HPF POC MODERATE (*)    All other components within normal limits  COMPREHENSIVE METABOLIC PANEL - Abnormal; Notable for the following:    CO2 20 (*)    Glucose, Bld 113 (*)    Calcium 8.6 (*)    All other components within normal limits  CBC - Abnormal; Notable for the following:    WBC 12.5 (*)     All other components within normal limits  LIPASE, BLOOD  URINALYSIS, ROUTINE W REFLEX MICROSCOPIC  I-STAT BETA HCG BLOOD, ED ([facility], [facility], [facility] ONLY)  GC/CHLAMYDIA PROBE AMP ([place]) NOT AT [facility]    EKG  EKG Interpretation  Date/Time:  Sunday June 26 2017 23:34:10 EDT Ventricular Rate:  83 PR Interval:    QRS Duration: 92 QT Interval:  363 QTC Calculation: 427 R Axis:   -29 Text Interpretation:  Sinus rhythm Borderline left axis deviation Confirmed by Thayer Jew [phone]) on 06/26/2017 11:43:36 PM       Radiology Ct Abdomen Pelvis W Contrast  Result Date: 06/27/2017 CLINICAL DATA:  Acute left lower quadrant pain. EXAM: CT ABDOMEN AND PELVIS WITH CONTRAST TECHNIQUE: Multidetector CT imaging of the abdomen and pelvis was performed using the standard protocol following bolus administration of intravenous  contrast. CONTRAST:  182mL ISOVUE-300 IOPAMIDOL (ISOVUE-300) INJECTION 61% COMPARISON:  None. FINDINGS: Lower chest: No acute abnormality. Dependent atelectasis. The included heart size is within normal limits. Hepatobiliary: No space-occupying mass of the liver. No biliary dilatation. Pancreas: Normal Spleen: Normal Adrenals/Urinary Tract: Normal bilateral adrenal glands and kidneys without obstructive uropathy. Unremarkable appearance of the bladder. Stomach/Bowel: Pericolonic inflammation surrounding a focal diverticulum off the distal descending colon consistent with an acute uncomplicated diverticulitis. No abscess or bowel obstruction. No free air. The stomach, small intestine and appendix are normal. Vascular/Lymphatic: Aortoiliac atherosclerosis. Reproductive: Bilateral tubal ligations. IUD noted within the expected location of the endometrial cavity. No adnexal mass. Other: Small fat containing umbilical hernia No abdominopelvic ascites. Musculoskeletal: No acute nor suspicious osseous abnormality. Mild disc space narrowing T10-11 and L5-S1. IMPRESSION: Acute uncomplicated distal  descending diverticulitis. Electronically Signed   By: Ashley Royalty M.D.   On: 06/27/2017 01:30    Procedures Procedures (including critical care time)  Medications Ordered in ED Medications  sodium chloride 0.9 % bolus 1,000 mL (0 mLs Intravenous Stopped 06/27/17 0207)  iopamidol (ISOVUE-300) 61 % injection (100 mLs  Contrast Given 06/27/17 0055)  morphine 4 MG/ML injection 4 mg (4 mg Intravenous Given 06/27/17 0021)  ondansetron (ZOFRAN) injection 4 mg (4 mg Intravenous Given 06/27/17 0020)  ciprofloxacin (CIPRO) tablet 500 mg (500 mg Oral Given 06/27/17 8119)  metroNIDAZOLE (FLAGYL) tablet 500 mg (500 mg Oral Given 06/27/17 0212)  oxyCODONE-acetaminophen (PERCOCET/ROXICET) 5-325 MG per tablet 1 tablet (1 tablet Oral Given 06/27/17 0212)     Initial Impression / Assessment and Plan / ED Course  I have reviewed the triage vital signs and the nursing notes.  Pertinent labs & imaging results that were available during my care of the patient were reviewed by me and considered in my medical decision making (see chart for details).     Vitals:   06/27/17 0000 06/27/17 0100 06/27/17 0130 06/27/17 0212  BP: 124/79 117/75 118/66 96/62  Pulse: 74 65 72 73  Resp: 13 12 14 18   Temp:      TempSrc:      SpO2: 99% 98% 94% 97%  Weight:      Height:        Medications  sodium chloride 0.9 % bolus 1,000 mL (0 mLs Intravenous Stopped 06/27/17 0207)  iopamidol (ISOVUE-300) 61 % injection (100 mLs  Contrast Given 06/27/17 0055)  morphine 4 MG/ML injection 4 mg (4 mg Intravenous Given 06/27/17 0021)  ondansetron (ZOFRAN) injection 4 mg (4 mg Intravenous Given 06/27/17 0020)  ciprofloxacin (CIPRO) tablet 500 mg (500 mg Oral Given 06/27/17 1478)  metroNIDAZOLE (FLAGYL) tablet 500 mg (500 mg Oral Given 06/27/17 0212)  oxyCODONE-acetaminophen (PERCOCET/ROXICET) 5-325 MG per tablet 1 tablet (1 tablet Oral Given 06/27/17 2956)    GENI SKORUPSKI is 46 y.o. female presenting with LLQ pain With no associated  symptoms. Abdominal exam is benign. The pelvic exam is unremarkable, patient has yeast however, she has no symptoms. CAT scan is consistent with uncomplicated diverticulitis. Patient recommended soft diet and Cipro Flagyl, she has outpatient GI.   Evaluation does not show pathology that would require ongoing emergent intervention or inpatient treatment. Pt is hemodynamically stable and mentating appropriately. Discussed findings and plan with patient/guardian, who agrees with care plan. All questions answered. Return precautions discussed and outpatient follow up given.    Final Clinical Impressions(s) / ED Diagnoses   Final diagnoses:  Diverticulitis    New Prescriptions Discharge Medication List as of 06/27/2017  2:03 AM    START taking these medications   Details  ciprofloxacin (CIPRO) 500 MG tablet Take 1 tablet (500 mg total) by mouth every 12 (twelve) hours., Starting Mon 06/27/2017, Print    metroNIDAZOLE (FLAGYL) 500 MG tablet Take 1 tablet (500 mg total) by mouth 3 (three) times daily., Starting Mon 06/27/2017, Until Thu 07/07/2017, Print    oxyCODONE-acetaminophen (PERCOCET) 5-325 MG tablet Take 1 tablet by mouth every 4 (four) hours as needed., Starting Mon 06/27/2017, Print         Maleiya Pergola, [place], PA-C 06/27/17 0408    Horton, Barbette Hair, MD 06/27/17 [phone]

## 2017-06-27 ENCOUNTER — Emergency Department (HOSPITAL_COMMUNITY): Payer: BLUE CROSS/BLUE SHIELD

## 2017-06-27 DIAGNOSIS — K5732 Diverticulitis of large intestine without perforation or abscess without bleeding: Secondary | ICD-10-CM | POA: Diagnosis not present

## 2017-06-27 DIAGNOSIS — K5720 Diverticulitis of large intestine with perforation and abscess without bleeding: Secondary | ICD-10-CM | POA: Diagnosis not present

## 2017-06-27 LAB — WET PREP, GENITAL
CLUE CELLS WET PREP: NONE SEEN
Sperm: NONE SEEN
Trich, Wet Prep: NONE SEEN

## 2017-06-27 LAB — GC/CHLAMYDIA PROBE AMP (~~LOC~~) NOT AT ARMC
CHLAMYDIA, DNA PROBE: NEGATIVE
NEISSERIA GONORRHEA: NEGATIVE

## 2017-06-27 MED ORDER — OXYCODONE-ACETAMINOPHEN 5-325 MG PO TABS
1.0000 | ORAL_TABLET | Freq: Once | ORAL | Status: AC
  Administered 2017-06-27: 1 via ORAL
  Filled 2017-06-27: qty 1

## 2017-06-27 MED ORDER — ONDANSETRON HCL 4 MG/2ML IJ SOLN
4.0000 mg | Freq: Once | INTRAMUSCULAR | Status: AC
  Administered 2017-06-27: 4 mg via INTRAVENOUS
  Filled 2017-06-27: qty 2

## 2017-06-27 MED ORDER — OXYCODONE-ACETAMINOPHEN 5-325 MG PO TABS: 1.0000 | ORAL_TABLET | ORAL | 0 refills | Status: DC | PRN

## 2017-06-27 MED ORDER — MORPHINE SULFATE (PF) 4 MG/ML IV SOLN
4.0000 mg | Freq: Once | INTRAVENOUS | Status: AC
  Administered 2017-06-27: 4 mg via INTRAVENOUS
  Filled 2017-06-27: qty 1

## 2017-06-27 MED ORDER — METRONIDAZOLE 500 MG PO TABS: 500.0000 mg | ORAL_TABLET | Freq: Three times a day (TID) | ORAL | 0 refills | Status: AC

## 2017-06-27 MED ORDER — CIPROFLOXACIN HCL 500 MG PO TABS: 500.0000 mg | ORAL_TABLET | Freq: Two times a day (BID) | ORAL | 0 refills | Status: DC

## 2017-06-27 MED ORDER — FLUCONAZOLE 200 MG PO TABS: 200.0000 mg | ORAL_TABLET | Freq: Once | ORAL | 0 refills | Status: AC

## 2017-06-27 MED ORDER — CIPROFLOXACIN HCL 500 MG PO TABS
500.0000 mg | ORAL_TABLET | Freq: Once | ORAL | Status: AC
  Administered 2017-06-27: 500 mg via ORAL
  Filled 2017-06-27: qty 1

## 2017-06-27 MED ORDER — METRONIDAZOLE 500 MG PO TABS
500.0000 mg | ORAL_TABLET | Freq: Once | ORAL | Status: AC
  Administered 2017-06-27: 500 mg via ORAL
  Filled 2017-06-27: qty 1

## 2017-06-27 NOTE — ED Notes (Signed)
Patient transported to CT 

## 2017-06-27 NOTE — Discharge Instructions (Signed)
You must maintain a full liquid diet for 3 days.  Take percocet for breakthrough pain, do not drink alcohol, drive, care for children or do other critical tasks while taking percocet.  Take your antibiotics as directed and to completion. You should never have any leftover antibiotics! Push fluids and stay well hydrated.  Do not drink alcohol while you are taking flagyl (metronidazole) because it will make you very sick.  Please take probiotics that you can get over-the-counter, asked your pharmacist for recommendations if needed.  Please follow with your primary care doctor in the next 2 days for a check-up. They must obtain records for further management.   Do not hesitate to return to the Emergency Department for any new, worsening or concerning symptoms.

## 2017-06-29 ENCOUNTER — Ambulatory Visit (INDEPENDENT_AMBULATORY_CARE_PROVIDER_SITE_OTHER): Payer: BLUE CROSS/BLUE SHIELD | Admitting: Nurse Practitioner

## 2017-06-29 ENCOUNTER — Encounter: Payer: Self-pay | Admitting: Nurse Practitioner

## 2017-06-29 VITALS — BP 120/80 | HR 67 | Temp 97.3°F | Ht 64.0 in | Wt 268.0 lb

## 2017-06-29 DIAGNOSIS — Z09 Encounter for follow-up examination after completed treatment for conditions other than malignant neoplasm: Secondary | ICD-10-CM

## 2017-06-29 DIAGNOSIS — M797 Fibromyalgia: Secondary | ICD-10-CM

## 2017-06-29 DIAGNOSIS — K5730 Diverticulosis of large intestine without perforation or abscess without bleeding: Secondary | ICD-10-CM

## 2017-06-29 DIAGNOSIS — K5732 Diverticulitis of large intestine without perforation or abscess without bleeding: Secondary | ICD-10-CM

## 2017-06-29 DIAGNOSIS — K5790 Diverticulosis of intestine, part unspecified, without perforation or abscess without bleeding: Secondary | ICD-10-CM | POA: Insufficient documentation

## 2017-06-29 NOTE — Progress Notes (Signed)
   Subjective:    Patient ID: Rachael Allen, female    DOB: 01-18-1971, 46 y.o.   MRN: [id number]  HPI Patient was taken to the ER on Sunday with acute abdominal pain and fever. SHe was diagnosed with diverticulitis and was given cipro and flagyl. Has gotten better but still has slight pain occasionally. SHe said that her fibromyalgia has flared up since she got sick.    Review of Systems  Constitutional: Negative.   HENT: Negative.   Respiratory: Negative.   Cardiovascular: Negative.   Gastrointestinal: Positive for abdominal pain (left side- improving).  Neurological: Negative.   All other systems reviewed and are negative.      Objective:   Physical Exam  Constitutional: She is oriented to person, place, and time. She appears well-developed and well-nourished. No distress.  Cardiovascular: Normal rate and regular rhythm.   Pulmonary/Chest: Effort normal and breath sounds normal.  Abdominal: Soft. Bowel sounds are normal. There is tenderness (very mild).  Neurological: She is alert and oriented to person, place, and time.  Skin: Skin is warm.  Psychiatric: She has a normal mood and affect. Her behavior is normal. Judgment and thought content normal.    BP 120/80   Pulse 67   Temp (!) 97.3 F (36.3 C) (Oral)   Ht 5\' 4"  (1.626 m)   Wt 268 lb (121.6 kg)   BMI 46.00 kg/m        Assessment & Plan:  1. Hospital discharge follow-up Hospital records reviewed  2. Diverticulitis of large intestine without perforation or abscess without bleeding Finish flagyl and cipro  3. Diverticulosis of large intestine without hemorrhage Avoid foods with small seeds , popcorn, peanuts and corn  4. Fibromyalgia muscle pain Moist heat to painful areas Take oxycodone as needed RTO prn  Mary-Margaret Hassell Done, FNP

## 2017-06-29 NOTE — Patient Instructions (Signed)

## 2017-06-30 ENCOUNTER — Ambulatory Visit: Payer: Commercial Managed Care - HMO | Admitting: Nurse Practitioner

## 2017-07-01 ENCOUNTER — Telehealth: Payer: Self-pay | Admitting: Nurse Practitioner

## 2017-07-01 MED ORDER — VALACYCLOVIR HCL 1 G PO TABS: ORAL_TABLET | ORAL | 1 refills | Status: DC

## 2017-07-01 NOTE — Telephone Encounter (Signed)
Patient notified that rx sent to pharmacy 

## 2017-08-09 DIAGNOSIS — H8112 Benign paroxysmal vertigo, left ear: Secondary | ICD-10-CM | POA: Diagnosis not present

## 2017-08-09 DIAGNOSIS — R42 Dizziness and giddiness: Secondary | ICD-10-CM | POA: Diagnosis not present

## 2017-08-09 DIAGNOSIS — H903 Sensorineural hearing loss, bilateral: Secondary | ICD-10-CM | POA: Diagnosis not present

## 2017-11-25 ENCOUNTER — Other Ambulatory Visit: Payer: Self-pay

## 2017-11-25 ENCOUNTER — Telehealth: Payer: Self-pay | Admitting: Nurse Practitioner

## 2017-11-25 MED ORDER — CIPROFLOXACIN HCL 500 MG PO TABS: 500.0000 mg | ORAL_TABLET | Freq: Two times a day (BID) | ORAL | 0 refills | Status: DC

## 2017-11-25 MED ORDER — METRONIDAZOLE 500 MG PO TABS: 500.0000 mg | ORAL_TABLET | Freq: Two times a day (BID) | ORAL | 0 refills | Status: DC

## 2017-11-25 NOTE — Telephone Encounter (Signed)
Patient woke up this morning with abdominal pain in her left lower quadrant, feels like it did when she had diverticulitis.  She is asking if you will send in Cipro and Flagyl to [facility].  Please advise.

## 2017-11-25 NOTE — Telephone Encounter (Signed)
Patient aware.

## 2017-11-25 NOTE — Telephone Encounter (Signed)
What symptoms do you have? Pain in intestine/diverticulitis  How long have you been sick? today  Have you been seen for this problem? yes  If your provider decides to give you a prescription, which pharmacy would you like for it to be sent to?[facility]   Patient informed that this information will be sent to the clinical staff for review and that they should receive a follow up call.

## 2017-11-25 NOTE — Telephone Encounter (Signed)
cipro and flagyl sent to pharmacy- next time will ntbs

## 2017-12-09 DIAGNOSIS — H8112 Benign paroxysmal vertigo, left ear: Secondary | ICD-10-CM | POA: Diagnosis not present

## 2017-12-30 ENCOUNTER — Ambulatory Visit: Payer: BLUE CROSS/BLUE SHIELD | Admitting: Nurse Practitioner

## 2018-01-05 ENCOUNTER — Encounter: Payer: Self-pay | Admitting: Nurse Practitioner

## 2018-01-05 ENCOUNTER — Ambulatory Visit (INDEPENDENT_AMBULATORY_CARE_PROVIDER_SITE_OTHER): Payer: BLUE CROSS/BLUE SHIELD | Admitting: Nurse Practitioner

## 2018-01-05 VITALS — BP 127/86 | HR 76 | Temp 97.6°F | Ht 64.0 in | Wt 277.0 lb

## 2018-01-05 DIAGNOSIS — L739 Follicular disorder, unspecified: Secondary | ICD-10-CM

## 2018-01-05 NOTE — Progress Notes (Signed)
   Subjective:    Patient ID: Rachael Allen, female    DOB: February 07, 1971, 47 y.o.   MRN: [id number]  HPI patient come sin today c/o lesion on her bottom. Noticed it about 2 months ago- has not changed in size and does not hurt.    Review of Systems  Constitutional: Negative.   HENT: Negative.   Respiratory: Negative.   Cardiovascular: Negative.   Neurological: Negative.   Psychiatric/Behavioral: Negative.   All other systems reviewed and are negative.      Objective:   Physical Exam  Constitutional: She is oriented to person, place, and time. She appears well-developed and well-nourished. No distress.  Cardiovascular: Normal rate and regular rhythm.  Pulmonary/Chest: Effort normal and breath sounds normal.  Abdominal: Soft. Bowel sounds are normal.  Genitourinary:  Genitourinary Comments: elnarged hair follicle on labis majoria bil-  Neurological: She is alert and oriented to person, place, and time.  Skin: Skin is warm.  Psychiatric: She has a normal mood and affect. Her behavior is normal. Judgment and thought content normal.    BP 127/86   Pulse 76   Temp 97.6 F (36.4 C) (Oral)   Ht 5\' 4"  (1.626 m)   Wt 277 lb (125.6 kg)   BMI 47.55 kg/m        Assessment & Plan:  1. Inflamed hair follicle Just watch for now rto if enlarging  [place], FNP

## 2018-01-13 DIAGNOSIS — G4730 Sleep apnea, unspecified: Secondary | ICD-10-CM | POA: Diagnosis not present

## 2018-01-13 DIAGNOSIS — F3181 Bipolar II disorder: Secondary | ICD-10-CM | POA: Diagnosis not present

## 2018-02-07 DIAGNOSIS — Z1231 Encounter for screening mammogram for malignant neoplasm of breast: Secondary | ICD-10-CM | POA: Diagnosis not present

## 2018-02-07 LAB — HM MAMMOGRAPHY

## 2018-05-04 ENCOUNTER — Ambulatory Visit (INDEPENDENT_AMBULATORY_CARE_PROVIDER_SITE_OTHER): Payer: BLUE CROSS/BLUE SHIELD | Admitting: Nurse Practitioner

## 2018-05-04 ENCOUNTER — Encounter: Payer: Self-pay | Admitting: Nurse Practitioner

## 2018-05-04 VITALS — BP 141/97 | HR 90 | Temp 98.3°F | Ht 64.0 in | Wt 277.0 lb

## 2018-05-04 DIAGNOSIS — M79602 Pain in left arm: Secondary | ICD-10-CM

## 2018-05-04 MED ORDER — PREDNISONE 10 MG (21) PO TBPK: ORAL_TABLET | ORAL | 0 refills | Status: DC

## 2018-05-04 NOTE — Progress Notes (Signed)
   Subjective:    Patient ID: Rachael Allen, female    DOB: [birth year], 47 y.o.   MRN: [id number]   Chief Complaint: Left upper arm pain (No injury. Radiates to shoulder and elbow)   HPI Patient come sin today c/o left arm pain. Not sure what she has done to it, cannot recall an injury. Has been hurting for over 5 weeks. Pain is in the middle of upper arm and radiates to shoulder and elbow when she is using it. rates pain as a constant 7/10 and will go to 10/10 when using it. Ibuprofen helps some.  Review of Systems  Constitutional: Negative.   Respiratory: Positive for chest tightness and shortness of breath.   Cardiovascular: Positive for chest pain.  Musculoskeletal: Positive for myalgias (left arm).  Neurological: Negative.   Psychiatric/Behavioral: Negative.   All other systems reviewed and are negative.      Objective:   Physical Exam  Constitutional: She is oriented to person, place, and time. She appears well-developed and well-nourished.  Cardiovascular: Normal rate.  strong pulse in left wrist  Pulmonary/Chest: Effort normal and breath sounds normal.  Musculoskeletal:  Point tenderness in center of deltoid on left arm FROM of left shoulder with pain on internal roattion and full extension FROM of elbow without problems Grips equal bil  Neurological: She is alert and oriented to person, place, and time.  Skin: Skin is warm.  Psychiatric: She has a normal mood and affect. Her behavior is normal. Thought content normal.   BP (!) 141/97   Pulse 90   Temp 98.3 F (36.8 C) (Oral)   Ht 5\' 4"  (1.626 m)   Wt 277 lb (125.6 kg)   BMI 47.55 kg/m         Assessment & Plan:  Rachael Allen in today with chief complaint of Left upper arm pain (No injury. Radiates to shoulder and elbow)   1. Arm pain, musculoskeletal, left Moist heat Rest  RTO if no better in 2 weeks - predniSONE (STERAPRED UNI-PAK 21 TAB) 10 MG (21) TBPK tablet; As directed x 6 days  Dispense:  21 tablet; Refill: 0  Mary-Margaret Hassell Done, FNP

## 2018-06-14 DIAGNOSIS — M25512 Pain in left shoulder: Secondary | ICD-10-CM | POA: Diagnosis not present

## 2018-07-18 ENCOUNTER — Encounter: Payer: Self-pay | Admitting: Physical Therapy

## 2018-07-18 ENCOUNTER — Ambulatory Visit: Payer: BLUE CROSS/BLUE SHIELD | Attending: Orthopedic Surgery | Admitting: Physical Therapy

## 2018-07-18 DIAGNOSIS — M6281 Muscle weakness (generalized): Secondary | ICD-10-CM

## 2018-07-18 DIAGNOSIS — M25512 Pain in left shoulder: Secondary | ICD-10-CM | POA: Insufficient documentation

## 2018-07-18 NOTE — Therapy (Signed)
[facility] [place], [place], [address] Phone: [phone]   Fax:  [phone]  Physical Therapy Evaluation  Patient Details  Name: Rachael Allen MRN: [id number] Date of Birth: 08-18-1971 Referring Provider: Frederik Pear, MD   Encounter Date: 07/18/2018  PT End of Session - 07/18/18 1211    Visit Number  1    Number of Visits  13    Authorization Type  BCBS    PT Start Time  0355    PT Stop Time  1215    PT Time Calculation (min)  54 min    Activity Tolerance  Patient tolerated treatment well    Behavior During Therapy  [facility] for tasks assessed/performed       Past Medical History:  Diagnosis Date  . Bipolar disorder ([place])   . Depression     Past Surgical History:  Procedure Laterality Date  . CESAREAN SECTION    . TONSILLECTOMY AND ADENOIDECTOMY      There were no vitals filed for this visit.   Subjective Assessment - 07/18/18 1132    Subjective  Pt arriving to therapy reporting 4/10 left shoulder pain. Pt reporting this has been going on since April 2019. Pt reporting increased pain with use. Pt reporting pain can increase to an 8/10 with activities. Pt reporting no falls or accident and reports just waking up one morning with shoulder pain.     Pertinent History  C-section, L knee surgery, L and R foot surgery (ganglion cysts)    Limitations  House hold activities    Patient Stated Goals  stop hurting,     Currently in Pain?  Yes    Pain Score  4     Pain Location  Shoulder    Pain Orientation  Left    Pain Descriptors / Indicators  Aching;Shooting;Stabbing    Pain Type  Acute pain    Pain Onset  More than a month ago    Aggravating Factors   reaching, lifting objects, donning and doffing her pants and tops    Pain Relieving Factors  resting         [facility] PT Assessment - 07/18/18 0001      Assessment   Medical Diagnosis  L shoulder pain    Referring Provider  Frederik Pear, MD    Onset  Date/Surgical Date  03/18/18    Hand Dominance  Right    Prior Therapy  no      Precautions   Precautions  None      Restrictions   Weight Bearing Restrictions  No      Balance Screen   Has the patient fallen in the past 6 months  No    Is the patient reluctant to leave their home because of a fear of falling?   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      Cognition   Overall Cognitive Status  Within Functional Limits for tasks assessed      Posture/Postural Control   Posture/Postural Control  Postural limitations    Postural Limitations  Rounded Shoulders;Forward  head      ROM / Strength   AROM / PROM / Strength  AROM;Strength      AROM   AROM Assessment Site  Shoulder    Right/Left Shoulder  Left    Left Shoulder Extension  32 Degrees    Left Shoulder Flexion  108 Degrees    Left Shoulder ABduction  80 Degrees    Left Shoulder Internal Rotation  -- unable to reach SI joint, able to place your hand on stomach    Left Shoulder External Rotation  40 Degrees      Strength   Overall Strength Comments  R grip: 55 ppsi, L 20ppsi    Strength Assessment Site  Shoulder    Right/Left Shoulder  Left    Left Shoulder Flexion  3/5    Left Shoulder Extension  3/5    Left Shoulder ABduction  3-/5    Left Shoulder Internal Rotation  3-/5    Left Shoulder External Rotation  3-/5      Palpation   Palpation comment  tenderness left anterior shoulder, supraspinatus and infraspinatus, Pt also reporting tenderness over biceps muscle belly      Special Tests    Special Tests  Rotator Cuff Impingement    Rotator Cuff Impingment tests  Empty Can test;Full Can test      Empty Can test   Findings  Positive     Side  Left      Full Can test   Findings  Negative    Side  Left                Objective measurements completed on examination: See above findings.              PT Education - 07/18/18 1210    Education Details  HEP    Person(s) Educated  Patient    Methods  Explanation;Handout;Verbal cues;Demonstration    Comprehension  Verbalized understanding;Returned demonstration       PT Short Term Goals - 07/18/18 1234      PT SHORT TERM GOAL #1   Title  STG's = LTG's        PT Long Term Goals - 07/18/18 1234      PT LONG TERM GOAL #1   Title  Pt will be independent in her HEP and progression.     Time  6    Period  Weeks    Status  New    Target Date  08/29/18      PT LONG TERM GOAL #2   Title  Pt will be able to improve left shoulder flexion to >/= 150 degrees in order to improve functional mobility.     Time  6    Period  Weeks    Status  New    Target Date  08/29/18      PT LONG TERM GOAL #3   Title  Pt will be able to increase her left shoulder IR to >/= T10 level in order to improve funcitonal mobility.     Time  6    Period  Weeks    Status  New    Target Date  08/29/18      PT LONG TERM GOAL #4   Title  Pt will be able to lift a bag of groceries with pain </= 2/10.     Time  6    Period  Weeks    Status  New      PT  LONG TERM GOAL #5   Title  Pt will improve her left grip strength to >/= 40ppsi.     Time  6    Period  Weeks    Status  New    Target Date  08/29/18             Plan - 07/18/18 1229    Clinical Impression Statement  Pt presenting to therapy for evaluation of left shoulder pain which began in April 2019. Pt reporting no injury or cause, but states she just woke up one morning with pain. 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 Pt with weakness in Left shoulder grossly 3-3+/5 and weakness noted in left grip strength. Pt was given a HEP with shoulder flexion and ER followed by E-stim to left anterior  shoulder with moist heat. Pt reporting less pain at end of session. Skilled PT needded to progress pt toward her PLOF with the below interventions.     Clinical Presentation  Stable    Clinical Decision Making  Low    Rehab Potential  Excellent    PT Frequency  2x / week    PT Duration  6 weeks    PT Treatment/Interventions  ADLs/Self Care Home Management;Electrical Stimulation;Iontophoresis 4mg /ml Dexamethasone;Moist Heat;Ultrasound;Functional mobility training;Therapeutic activities;Therapeutic exercise;Manual techniques;Patient/family education;Passive range of motion;Dry needling;Taping;Cryotherapy    PT Next Visit Plan  Shoulder ROM, gentle strengthening, modalities as needed    PT Home Exercise Plan  shoulder flexion with cane, ER with cane in supine    Consulted and Agree with Plan of Care  Patient       Patient will benefit from skilled therapeutic intervention in order to improve the following deficits and impairments:  Pain, Postural dysfunction, Decreased range of motion, Decreased strength, Impaired UE functional use  Visit Diagnosis: Acute pain of left shoulder  Muscle weakness (generalized)     Problem List Patient Active Problem List   Diagnosis Date Noted  . Diverticulosis 06/29/2017  . Bipolar disorder ([place]) 01/26/2016  . Depression 05/09/2014  . Narcolepsy 05/09/2014  . Migraines 04/19/2013  . Fibromyalgia muscle pain 03/07/2013  . PCOS (polycystic ovarian syndrome) 03/07/2013    Oretha Caprice, MPT 07/18/2018, 12:39 PM  [facility] [place], [place], [address] Phone: [phone]   Fax:  [phone]  Name: ANVIKA GASHI MRN: [id number] Date of Birth: 1971/01/31

## 2018-07-20 ENCOUNTER — Ambulatory Visit: Payer: BLUE CROSS/BLUE SHIELD | Admitting: Physical Therapy

## 2018-07-20 ENCOUNTER — Encounter: Payer: Self-pay | Admitting: Physical Therapy

## 2018-07-20 DIAGNOSIS — M6281 Muscle weakness (generalized): Secondary | ICD-10-CM

## 2018-07-20 DIAGNOSIS — M25512 Pain in left shoulder: Secondary | ICD-10-CM | POA: Diagnosis not present

## 2018-07-20 NOTE — Therapy (Signed)
[facility] [place], [place], [address] Phone: [phone]   Fax:  [phone]  Physical Therapy Treatment  Patient Details  Name: Rachael Allen MRN: [id number] Date of Birth: 12-11-1971 Referring Provider: Frederik Pear, MD   Encounter Date: 07/20/2018  PT End of Session - 07/20/18 1132    Visit Number  2    Number of Visits  13    Authorization Type  BCBS    PT Start Time  6948    PT Stop Time  1205    PT Time Calculation (min)  47 min    Activity Tolerance  Patient tolerated treatment well    Behavior During Therapy  [facility] for tasks assessed/performed       Past Medical History:  Diagnosis Date  . Bipolar disorder ([place])   . Depression     Past Surgical History:  Procedure Laterality Date  . CESAREAN SECTION    . TONSILLECTOMY AND ADENOIDECTOMY      There were no vitals filed for this visit.  Subjective Assessment - 07/20/18 1130    Subjective  Pt arriving to therapy reporting 4/10 pain today in left shoulder. Pt reported doing her HEP.     Pertinent History  C-section, L knee surgery, L and R foot surgery (ganglion cysts)    Limitations  House hold activities    Patient Stated Goals  stop hurting,     Currently in Pain?  Yes    Pain Score  4     Pain Location  Shoulder    Pain Orientation  Left    Pain Descriptors / Indicators  Aching;Sore;Stabbing    Pain Type  Acute pain    Pain Onset  More than a month ago    Pain Frequency  Intermittent                       [facility] Adult PT Treatment/Exercise - 07/20/18 0001      Exercises   Exercises  Shoulder      Shoulder Exercises: Supine   External Rotation  AAROM;15 reps    Flexion  AAROM;15 reps      Shoulder Exercises: Isometric Strengthening   Flexion  Limitations;Other (comment)    Flexion Limitations  10 x 5 sec hold    Extension  Limitations;Other (comment)    Extension Limitations  10 x 5 sec hold    External Rotation   Limitations;Other (comment)    External Rotation Limitations  10 x 5 sec hold    Internal Rotation  Limitations;Other (comment)    Internal Rotation Limitations  10 x  5 sec hold      Modalities   Modalities  Electrical Stimulation;Moist Heat      Moist Heat Therapy   Number Minutes Moist Heat  15 Minutes    Moist Heat Location  Shoulder      Electrical Stimulation   Electrical Stimulation Location  left    Electrical Stimulation Action  IFC: 80-150 Hz x 15 minutes, intensity to tolerance    Electrical Stimulation Goals  Pain      Manual Therapy   Manual Therapy  Passive ROM    Passive ROM  flexion/extension/abd/IR/ER             PT Education - 07/20/18 1132    Education Details  added new exercises to pt's HEP    Person(s) Educated  Patient    Methods  Explanation;Demonstration;Handout;Verbal cues    Comprehension  Verbalized understanding;Returned demonstration          PT Long Term Goals - 07/20/18 1203      PT LONG TERM GOAL #1   Title  Pt will be independent in her HEP and progression.     Baseline  Met 4/5.    Time  6    Period  Weeks    Status  New      PT LONG TERM GOAL #2   Title  Pt will be able to improve left shoulder flexion to >/= 150 degrees in order to improve functional mobility.     Time  6    Period  Weeks    Status  New      PT LONG TERM GOAL #3   Title  Pt will be able to increase her left shoulder IR to >/= T10 level in order to improve funcitonal mobility.     Time  6    Period  Weeks    Status  New      PT LONG TERM GOAL #4   Title  Pt will be able to lift a bag of groceries with pain </= 2/10.     Time  6    Period  Weeks      PT LONG TERM GOAL #5   Title  Pt will improve her left grip strength to >/= 40ppsi.     Time  6    Period  Weeks            Plan - 07/20/18 1202    Clinical Impression Statement  Pt tolerating all exercises well today, new exercises added to pt's HEP. Pt reporting less pain at end of session  of 3/10. Continue with skilled PT.     Rehab Potential  Excellent    PT Frequency  2x / week    PT Treatment/Interventions  ADLs/Self Care Home Management;Electrical Stimulation;Iontophoresis 45m/ml Dexamethasone;Moist Heat;Ultrasound;Functional mobility training;Therapeutic activities;Therapeutic exercise;Manual techniques;Patient/family education;Passive range of motion;Dry needling;Taping;Cryotherapy    PT Next Visit Plan  Shoulder ROM, gentle strengthening, modalities as needed    PT Home Exercise Plan  shoulder flexion with cane, ER with cane in supine, 4 way shouder isometrics, extension with towel, and IR with towel     Consulted and Agree with Plan of Care  Patient       Patient will benefit from skilled therapeutic intervention in order to improve the following deficits and impairments:  Pain, Postural dysfunction, Decreased range of motion, Decreased strength, Impaired UE functional use  Visit Diagnosis: Acute pain of left shoulder  Muscle weakness (generalized)     Problem List Patient Active Problem List   Diagnosis Date Noted  . Diverticulosis 06/29/2017  . Bipolar disorder ([place] 01/26/2016  . Depression 05/09/2014  . Narcolepsy 05/09/2014  . Migraines 04/19/2013  . Fibromyalgia muscle pain 03/07/2013  . PCOS (polycystic ovarian syndrome) 03/07/2013    JOretha Caprice MPT 07/20/2018, 12:04 PM  [facility] [address] [address]Phone: [phone]  Fax:  [phone] Name: Rachael FOLMERMRN: [id number] of Birth: [birth year]

## 2018-07-21 DIAGNOSIS — G4730 Sleep apnea, unspecified: Secondary | ICD-10-CM | POA: Diagnosis not present

## 2018-07-21 DIAGNOSIS — F3181 Bipolar II disorder: Secondary | ICD-10-CM | POA: Diagnosis not present

## 2018-07-25 ENCOUNTER — Encounter: Payer: Self-pay | Admitting: Physical Therapy

## 2018-07-25 ENCOUNTER — Ambulatory Visit: Payer: BLUE CROSS/BLUE SHIELD | Admitting: Physical Therapy

## 2018-07-25 DIAGNOSIS — M25512 Pain in left shoulder: Secondary | ICD-10-CM

## 2018-07-25 DIAGNOSIS — M6281 Muscle weakness (generalized): Secondary | ICD-10-CM | POA: Diagnosis not present

## 2018-07-25 NOTE — Therapy (Signed)
[facility] [place], [place], [address] Phone: [phone]   Fax:  [phone]  Physical Therapy Treatment  Patient Details  Name: Rachael Allen MRN: [id number] Date of Birth: 1971-10-22 Referring Provider: Frederik Pear, MD   Encounter Date: 07/25/2018  PT End of Session - 07/25/18 1740    Visit Number  3    Number of Visits  13    PT Start Time  0230    PT Stop Time  0324    PT Time Calculation (min)  54 min    Activity Tolerance  Patient tolerated treatment well    Behavior During Therapy  [facility] for tasks assessed/performed       Past Medical History:  Diagnosis Date  . Bipolar disorder ([place])   . Depression     Past Surgical History:  Procedure Laterality Date  . CESAREAN SECTION    . TONSILLECTOMY AND ADENOIDECTOMY      There were no vitals filed for this visit.  Subjective Assessment - 07/25/18 1742    Subjective  I've been doing the home exercises.    Patient Stated Goals  stop hurting,     Pain Score  4     Pain Location  Shoulder    Pain Orientation  Left    Pain Descriptors / Indicators  Aching;Sore;Stabbing    Pain Type  Acute pain    Pain Onset  More than a month ago                       [facility] Adult PT Treatment/Exercise - 07/25/18 0001      Exercises   Exercises  Shoulder      Shoulder Exercises: Pulleys   Flexion Limitations  5 minutes.    Other Pulley Exercises  UE Ranger on wall x 3 minutes.      Modalities   Modalities  Electrical Stimulation;Moist Heat      Moist Heat Therapy   Number Minutes Moist Heat  20 Minutes    Moist Heat Location  --   Left shoulder.     Acupuncturist Location  --   Left shoulder.   Electrical Stimulation Action  IFC at 80-150 Hz x 20 minutes.    Electrical Stimulation Goals  Pain      Manual Therapy   Manual Therapy  Passive ROM    Passive ROM  Manual low load long duration stretch into left  shoulder ER in the plane of the scapula while receiving Combo e'stim/U/S at 1.50 W/CM2 x 10 minutes.to patient's left anterior shoulder region.             PT Education - 07/25/18 1741    Education Details  Instruct in left shoulder anterior capsular stretch off table.       PT Short Term Goals - 07/18/18 1234      PT SHORT TERM GOAL #1   Title  STG's = LTG's        PT Long Term Goals - 07/20/18 1203      PT LONG TERM GOAL #1   Title  Pt will be independent in her HEP and progression.     Baseline  Met 4/5.    Time  6    Period  Weeks    Status  New      PT LONG TERM GOAL #2   Title  Pt will be able to improve left shoulder flexion to >/=  150 degrees in order to improve functional mobility.     Time  6    Period  Weeks    Status  New      PT LONG TERM GOAL #3   Title  Pt will be able to increase her left shoulder IR to >/= T10 level in order to improve funcitonal mobility.     Time  6    Period  Weeks    Status  New      PT LONG TERM GOAL #4   Title  Pt will be able to lift a bag of groceries with pain </= 2/10.     Time  6    Period  Weeks      PT LONG TERM GOAL #5   Title  Pt will improve her left grip strength to >/= 40ppsi.     Time  6    Period  Weeks            Plan - 07/25/18 1748    Clinical Impression Statement  Patient did very well with treatment today.  Added left shoulder anterior capsular stretch.    PT Treatment/Interventions  ADLs/Self Care Home Management;Electrical Stimulation;Iontophoresis 76m/ml Dexamethasone;Moist Heat;Ultrasound;Functional mobility training;Therapeutic activities;Therapeutic exercise;Manual techniques;Patient/family education;Passive range of motion;Dry needling;Taping;Cryotherapy    PT Next Visit Plan  Shoulder ROM, gentle strengthening, modalities as needed    PT Home Exercise Plan  shoulder flexion with cane, ER with cane in supine, 4 way shouder isometrics, extension with towel, and IR with towel      Consulted and Agree with Plan of Care  Patient       Patient will benefit from skilled therapeutic intervention in order to improve the following deficits and impairments:  Pain, Postural dysfunction, Decreased range of motion, Decreased strength, Impaired UE functional use  Visit Diagnosis: Acute pain of left shoulder  Muscle weakness (generalized)     Problem List Patient Active Problem List   Diagnosis Date Noted  . Diverticulosis 06/29/2017  . Bipolar disorder ([place] 01/26/2016  . Depression 05/09/2014  . Narcolepsy 05/09/2014  . Migraines 04/19/2013  . Fibromyalgia muscle pain 03/07/2013  . PCOS (polycystic ovarian syndrome) 03/07/2013    APPLEGATE, CMaliMPT 07/25/2018, 5:50 PM  [facility] [place] [address]Phone: [phone]  Fax:  [phone] Name: Rachael SEARLESMRN: [id number] of Birth: [birth year]

## 2018-07-27 ENCOUNTER — Ambulatory Visit: Payer: BLUE CROSS/BLUE SHIELD | Admitting: Physical Therapy

## 2018-07-27 DIAGNOSIS — M6281 Muscle weakness (generalized): Secondary | ICD-10-CM | POA: Diagnosis not present

## 2018-07-27 DIAGNOSIS — M25512 Pain in left shoulder: Secondary | ICD-10-CM

## 2018-07-27 NOTE — Therapy (Signed)
[facility] [place], [place], [address] Phone: [phone]   Fax:  [phone]  Physical Therapy Treatment  Patient Details  Name: Rachael Allen MRN: [id number] Date of Birth: [birth year] Referring Provider: Frederik Pear, MD   Encounter Date: 07/27/2018  PT End of Session - 07/27/18 1213    Visit Number  4    Number of Visits  13    Authorization Type  BCBS    PT Start Time  1115    PT Stop Time  1208    PT Time Calculation (min)  53 min    Activity Tolerance  Patient tolerated treatment well    Behavior During Therapy  [facility] for tasks assessed/performed       Past Medical History:  Diagnosis Date  . Bipolar disorder ([place])   . Depression     Past Surgical History:  Procedure Laterality Date  . CESAREAN SECTION    . TONSILLECTOMY AND ADENOIDECTOMY      There were no vitals filed for this visit.  Subjective Assessment - 07/27/18 1207    Subjective  Pt reporting 2/10 pain in her left shoulder. Pt reported she has been doing her HEP.     Currently in Pain?  Yes    Pain Score  2     Pain Location  Shoulder    Pain Orientation  Left    Pain Descriptors / Indicators  Aching;Sore    Pain Type  Acute pain    Pain Onset  More than a month ago                       [facility] Adult PT Treatment/Exercise - 07/27/18 0001      Exercises   Exercises  Shoulder      Shoulder Exercises: Standing   External Rotation  Left;15 reps;Theraband    Theraband Level (Shoulder External Rotation)  Level 1 (Yellow)    Internal Rotation  Left;15 reps    Flexion  Left;15 reps;Theraband    Theraband Level (Shoulder Flexion)  Level 1 (Yellow)    Extension  Left;15 reps;Theraband    Theraband Level (Shoulder Extension)  Level 1 (Yellow)      Shoulder Exercises: Pulleys   Flexion Limitations  5 minutes.    Other Pulley Exercises  UE Ranger on wall x 3 minutes.      Shoulder Exercises: Isometric Strengthening   Flexion  Limitations  5 x 5 second hold    Extension Limitations  5 x 5 second hold    External Rotation Limitations  5 x 5 second hold    Internal Rotation Limitations  5 x 5 second hold      Modalities   Modalities  Electrical Stimulation;Moist Heat      Moist Heat Therapy   Number Minutes Moist Heat  15 Minutes    Moist Heat Location  Shoulder      Electrical Stimulation   Electrical Stimulation Location  left shoulder    Electrical Stimulation Action  Pre-mod 80-150 Hz x 15 minutes    Electrical Stimulation Goals  Pain      Manual Therapy   Manual Therapy  Soft tissue mobilization    Soft tissue mobilization  STW to left shoulder, biceps with active trigger point release to bicep muscle belly.                PT Short Term Goals - 07/18/18 1234      PT SHORT  TERM GOAL #1   Title  STG's = LTG's        PT Long Term Goals - 07/27/18 1224      PT LONG TERM GOAL #1   Title  Pt will be independent in her HEP and progression.     Baseline  Met 4/5.    Time  6    Period  Weeks    Status  New      PT LONG TERM GOAL #2   Title  Pt will be able to improve left shoulder flexion to >/= 150 degrees in order to improve functional mobility.     Time  6    Period  Weeks    Status  New      PT LONG TERM GOAL #3   Title  Pt will be able to increase her left shoulder IR to >/= T10 level in order to improve funcitonal mobility.     Time  6    Period  Weeks    Status  New      PT LONG TERM GOAL #4   Title  Pt will be able to lift a bag of groceries with pain </= 2/10.     Time  6    Period  Weeks    Status  New      PT LONG TERM GOAL #5   Title  Pt will improve her left grip strength to >/= 40ppsi.     Period  Weeks    Status  New            Plan - 07/27/18 1215    Clinical Impression Statement  Pt tolerating all exercises well and reported no pain at end session. Continue with skilled PT.     Rehab Potential  Excellent    PT Frequency  2x / week    PT Duration   6 weeks    PT Treatment/Interventions  ADLs/Self Care Home Management;Electrical Stimulation;Iontophoresis 60m/ml Dexamethasone;Moist Heat;Ultrasound;Functional mobility training;Therapeutic activities;Therapeutic exercise;Manual techniques;Patient/family education;Passive range of motion;Dry needling;Taping;Cryotherapy    PT Next Visit Plan  Shoulder ROM, gentle strengthening, modalities as needed    PT Home Exercise Plan  shoulder flexion with cane, ER with cane in supine, 4 way shouder isometrics, extension with towel, and IR with towel     Consulted and Agree with Plan of Care  Patient       Patient will benefit from skilled therapeutic intervention in order to improve the following deficits and impairments:  Pain, Postural dysfunction, Decreased range of motion, Decreased strength, Impaired UE functional use  Visit Diagnosis: Acute pain of left shoulder  Muscle weakness (generalized)     Problem List Patient Active Problem List   Diagnosis Date Noted  . Diverticulosis 06/29/2017  . Bipolar disorder ([place] 01/26/2016  . Depression 05/09/2014  . Narcolepsy 05/09/2014  . Migraines 04/19/2013  . Fibromyalgia muscle pain 03/07/2013  . PCOS (polycystic ovarian syndrome) 03/07/2013    JOretha Caprice MPT 07/27/2018, 12:25 PM  [facility] [address] [address]Phone: [phone]  Fax:  [phone] Name: Rachael BARBIANMRN: [id number] of Birth: [birth year]

## 2018-08-01 ENCOUNTER — Encounter: Payer: Self-pay | Admitting: Physical Therapy

## 2018-08-01 ENCOUNTER — Ambulatory Visit: Payer: BLUE CROSS/BLUE SHIELD | Admitting: Physical Therapy

## 2018-08-01 DIAGNOSIS — M6281 Muscle weakness (generalized): Secondary | ICD-10-CM

## 2018-08-01 DIAGNOSIS — M25512 Pain in left shoulder: Secondary | ICD-10-CM | POA: Diagnosis not present

## 2018-08-01 NOTE — Therapy (Signed)
[facility] [place], [place], [address] Phone: [phone]   Fax:  [phone]  Physical Therapy Treatment  Patient Details  Name: Rachael Allen MRN: [id number] Date of Birth: August 21, 1971 Referring Provider: Frederik Pear, MD   Encounter Date: 08/01/2018  PT End of Session - 08/01/18 1120    Visit Number  5    Number of Visits  13    Authorization Type  BCBS    PT Start Time  1115    PT Stop Time  1200    PT Time Calculation (min)  45 min    Activity Tolerance  Patient tolerated treatment well    Behavior During Therapy  [facility] for tasks assessed/performed       Past Medical History:  Diagnosis Date  . Bipolar disorder ([place])   . Depression     Past Surgical History:  Procedure Laterality Date  . CESAREAN SECTION    . TONSILLECTOMY AND ADENOIDECTOMY      There were no vitals filed for this visit.  Subjective Assessment - 08/01/18 1119    Subjective  Reports that her shoulder is okay. Only painful activity is towel stretch.    Pertinent History  C-section, L knee surgery, L and R foot surgery (ganglion cysts)    Limitations  House hold activities    Patient Stated Goals  stop hurting,     Currently in Pain?  No/denies         [facility] PT Assessment - 08/01/18 0001      Assessment   Medical Diagnosis  L shoulder pain    Onset Date/Surgical Date  03/18/18    Hand Dominance  Right    Prior Therapy  no      Precautions   Precautions  None      Restrictions   Weight Bearing Restrictions  No                   [facility] Adult PT Treatment/Exercise - 08/01/18 0001      Exercises   Exercises  Shoulder      Shoulder Exercises: Standing   Protraction  Strengthening;Left;20 reps;Theraband    Theraband Level (Shoulder Protraction)  Level 1 (Yellow)    External Rotation  Strengthening;Left;20 reps;Theraband    Theraband Level (Shoulder External Rotation)  Level 1 (Yellow)    Internal Rotation   Strengthening;Left;20 reps;Theraband    Theraband Level (Shoulder Internal Rotation)  Level 1 (Yellow)    Flexion  Strengthening;Left;20 reps;Weights    Shoulder Flexion Weight (lbs)  1    ABduction  Strengthening;Left;20 reps;Weights    Shoulder ABduction Weight (lbs)  1    Extension  Strengthening;Left;20 reps;Theraband    Theraband Level (Shoulder Extension)  Level 1 (Yellow)    Row  Strengthening;Left;20 reps;Theraband    Theraband Level (Shoulder Row)  Level 1 (Yellow)    Diagonals  Strengthening;Left;20 reps;Weights    Diagonals Weight (lbs)  1    Other Standing Exercises  Wall slides x20, CW and CCW x20 reps    Other Standing Exercises  L shoulder scaption 1# x20 reps      Shoulder Exercises: Pulleys   Flexion  5 minutes    Other Pulley Exercises  UE ranger into flexion x20 reps      Shoulder Exercises: Stretch   Corner Stretch  3 reps;30 seconds      Modalities   Modalities  Electrical Stimulation;Moist Heat      Moist Heat Therapy   Number  Minutes Moist Heat  15 Minutes    Moist Heat Location  Shoulder      Electrical Stimulation   Electrical Stimulation Location  L shoulder    Electrical Stimulation Action  IFC    Electrical Stimulation Parameters  80-150 hz x15 min    Electrical Stimulation Goals  Pain      Manual Therapy   Manual Therapy  Soft tissue mobilization    Soft tissue mobilization  STW/IASTW to L Bicep and deltoids to reduce TPs and muscle tightness               PT Short Term Goals - 07/18/18 1234      PT SHORT TERM GOAL #1   Title  STG's = LTG's        PT Long Term Goals - 08/01/18 1147      PT LONG TERM GOAL #1   Title  Pt will be independent in her HEP and progression.     Baseline  Met 4/5.    Time  6    Period  Weeks    Status  Achieved      PT LONG TERM GOAL #2   Title  Pt will be able to improve left shoulder flexion to >/= 150 degrees in order to improve functional mobility.     Time  6    Period  Weeks    Status   On-going      PT LONG TERM GOAL #3   Title  Pt will be able to increase her left shoulder IR to >/= T10 level in order to improve funcitonal mobility.     Time  6    Period  Weeks    Status  On-going      PT LONG TERM GOAL #4   Title  Pt will be able to lift a bag of groceries with pain </= 2/10.     Time  6    Period  Weeks    Status  On-going      PT LONG TERM GOAL #5   Title  Pt will improve her left grip strength to >/= 40ppsi.     Period  Weeks    Status  On-going            Plan - 08/01/18 1148    Clinical Impression Statement  Patient presented in clinic with no L shoulder pain reports and reports of compliance with HEP. Patient guided through various strengthening and fuctional activities with light resistance. Demo and VCs provided throughout treatment to ensure proper technique. Patient at intermittant times in treatment reported pain down L bicep region. Less pain reported by patient with corner stretch when compared to towel stretch. STW and IASTW completed to L bicep and deltoids region to reduce TPs and muscle tightness palpable. Good redness response noted with IASTW to treated area with education to patient via PTA of potential soreness that may present. Normal modalities response noted following removal of the modalities.    Rehab Potential  Excellent    PT Frequency  2x / week    PT Duration  6 weeks    PT Treatment/Interventions  ADLs/Self Care Home Management;Electrical Stimulation;Iontophoresis 40m/ml Dexamethasone;Moist Heat;Ultrasound;Functional mobility training;Therapeutic activities;Therapeutic exercise;Manual techniques;Patient/family education;Passive range of motion;Dry needling;Taping;Cryotherapy    PT Next Visit Plan  Shoulder ROM, gentle strengthening, modalities as needed    PT Home Exercise Plan  shoulder flexion with cane, ER with cane in supine, 4 way shouder isometrics, extension with towel, and  IR with towel     Consulted and Agree with Plan of  Care  Patient       Patient will benefit from skilled therapeutic intervention in order to improve the following deficits and impairments:  Pain, Postural dysfunction, Decreased range of motion, Decreased strength, Impaired UE functional use  Visit Diagnosis: Acute pain of left shoulder  Muscle weakness (generalized)     Problem List Patient Active Problem List   Diagnosis Date Noted  . Diverticulosis 06/29/2017  . Bipolar disorder ([place]) 01/26/2016  . Depression 05/09/2014  . Narcolepsy 05/09/2014  . Migraines 04/19/2013  . Fibromyalgia muscle pain 03/07/2013  . PCOS (polycystic ovarian syndrome) 03/07/2013    Standley Brooking, PTA 08/01/2018, 12:04 PM  [facility] [address] [place], [place], [address] Phone: [phone]   Fax:  [phone]  Name: Rachael Allen MRN: [id number] Date of Birth: 1971-07-26

## 2018-08-03 ENCOUNTER — Ambulatory Visit: Payer: BLUE CROSS/BLUE SHIELD | Admitting: Physical Therapy

## 2018-08-03 ENCOUNTER — Encounter: Payer: Self-pay | Admitting: Physical Therapy

## 2018-08-03 DIAGNOSIS — M6281 Muscle weakness (generalized): Secondary | ICD-10-CM | POA: Diagnosis not present

## 2018-08-03 DIAGNOSIS — M25512 Pain in left shoulder: Secondary | ICD-10-CM

## 2018-08-03 NOTE — Therapy (Signed)
[facility] [place], [place], [address] Phone: [phone]   Fax:  [phone]  Physical Therapy Treatment  Patient Details  Name: Rachael Allen MRN: [id number] Date of Birth: 03/17/1971 Referring Provider: Frederik Pear, MD   Encounter Date: 08/03/2018  PT End of Session - 08/03/18 1436    Visit Number  6    Number of Visits  13    Authorization Type  BCBS    PT Start Time  3149    PT Stop Time  1521    PT Time Calculation (min)  49 min    Activity Tolerance  Patient tolerated treatment well    Behavior During Therapy  [facility] for tasks assessed/performed       Past Medical History:  Diagnosis Date  . Bipolar disorder ([place])   . Depression     Past Surgical History:  Procedure Laterality Date  . CESAREAN SECTION    . TONSILLECTOMY AND ADENOIDECTOMY      There were no vitals filed for this visit.  Subjective Assessment - 08/03/18 1436    Subjective  Reports no pain upon arrival.    Pertinent History  C-section, L knee surgery, L and R foot surgery (ganglion cysts)    Limitations  House hold activities    Patient Stated Goals  stop hurting,     Currently in Pain?  No/denies         [facility] PT Assessment - 08/03/18 0001      Assessment   Medical Diagnosis  L shoulder pain    Onset Date/Surgical Date  03/18/18    Hand Dominance  Right    Prior Therapy  no      Precautions   Precautions  None      Restrictions   Weight Bearing Restrictions  No                   [facility] Adult PT Treatment/Exercise - 08/03/18 0001      Exercises   Exercises  Shoulder      Shoulder Exercises: Prone   Retraction  Strengthening;Left;Weights;Limitations    Retraction Weight (lbs)  3    Retraction Limitations  3x10 reps    Extension  Strengthening;Left;Weights    Extension Weight (lbs)  3    Extension Limitations  3x10 reps    Horizontal ABduction 1  Strengthening;Left;Weights;Limitations   + L ant shoulder  pressure; pain down humerus   Horizontal ABduction 1 Weight (lbs)  2    Horizontal ABduction 1 Limitations  3x10 reps      Shoulder Exercises: Sidelying   External Rotation  Strengthening;Left;Weights   +L shoulder pressure   External Rotation Weight (lbs)  2    External Rotation Limitations  3x10 reps      Shoulder Exercises: Standing   Protraction  Strengthening;Left;Theraband;Limitations    Theraband Level (Shoulder Protraction)  Level 2 (Red)    Protraction Limitations  3x10 reps    External Rotation  Strengthening;Left;Theraband;Limitations    Theraband Level (Shoulder External Rotation)  Level 2 (Red)    External Rotation Limitations  3x10 reps    Internal Rotation  Strengthening;Left;Theraband;Limitations    Theraband Level (Shoulder Internal Rotation)  Level 2 (Red)    Internal Rotation Limitations  3x10 reps    Extension  Strengthening;Left;Theraband;Limitations    Theraband Level (Shoulder Extension)  Level 2 (Red)    Extension Limitations  3x10 reps    Row  Strengthening;Left;Theraband;Limitations    Theraband Level (Shoulder Row)  Level 2 (Red)    Row Limitations  3x10 reps    Diagonals  Strengthening;Left;Theraband    Theraband Level (Shoulder Diagonals)  Level 2 (Red)    Diagonals Limitations  D2 3x10 reps      Shoulder Exercises: Pulleys   Flexion  5 minutes      Shoulder Exercises: ROM/Strengthening   UBE (Upper Arm Bike)  90 RPM x6 min      Shoulder Exercises: Stretch   Corner Stretch  3 reps;20 seconds    Internal Rotation Stretch  5 reps   10 sec holds   Other Shoulder Stretches  L bicep stretch 3x20 sec      Modalities   Modalities  Electrical Stimulation;Moist Heat      Moist Heat Therapy   Number Minutes Moist Heat  15 Minutes    Moist Heat Location  Shoulder      Electrical Stimulation   Electrical Stimulation Location  L shoulder    Electrical Stimulation Action  IFC    Electrical Stimulation Parameters  80-150 hz x15 min    Electrical  Stimulation Goals  Pain               PT Short Term Goals - 07/18/18 1234      PT SHORT TERM GOAL #1   Title  STG's = LTG's        PT Long Term Goals - 08/01/18 1147      PT LONG TERM GOAL #1   Title  Pt will be independent in her HEP and progression.     Baseline  Met 4/5.    Time  6    Period  Weeks    Status  Achieved      PT LONG TERM GOAL #2   Title  Pt will be able to improve left shoulder flexion to >/= 150 degrees in order to improve functional mobility.     Time  6    Period  Weeks    Status  On-going      PT LONG TERM GOAL #3   Title  Pt will be able to increase her left shoulder IR to >/= T10 level in order to improve funcitonal mobility.     Time  6    Period  Weeks    Status  On-going      PT LONG TERM GOAL #4   Title  Pt will be able to lift a bag of groceries with pain </= 2/10.     Time  6    Period  Weeks    Status  On-going      PT LONG TERM GOAL #5   Title  Pt will improve her left grip strength to >/= 40ppsi.     Period  Weeks    Status  On-going            Plan - 08/03/18 1539    Clinical Impression Statement  Patient presented in clinic with no pain and was able to progress with increased resistance. Patient only experienced L shoulder pressure or discomfort with SL ER and prone horizontal abduction. Muscle fatigue also reported by patient following end of therex session. Normal modalities response noted following removal of the modalities.    Rehab Potential  Excellent    PT Frequency  2x / week    PT Duration  6 weeks    PT Treatment/Interventions  ADLs/Self Care Home Management;Electrical Stimulation;Iontophoresis 60m/ml Dexamethasone;Moist Heat;Ultrasound;Functional mobility training;Therapeutic activities;Therapeutic exercise;Manual techniques;Patient/family education;Passive range of  motion;Dry needling;Taping;Cryotherapy    PT Next Visit Plan  Shoulder ROM, gentle strengthening, modalities as needed    PT Home Exercise  Plan  shoulder flexion with cane, ER with cane in supine, 4 way shouder isometrics, extension with towel, and IR with towel     Consulted and Agree with Plan of Care  Patient       Patient will benefit from skilled therapeutic intervention in order to improve the following deficits and impairments:  Pain, Postural dysfunction, Decreased range of motion, Decreased strength, Impaired UE functional use  Visit Diagnosis: Acute pain of left shoulder  Muscle weakness (generalized)     Problem List Patient Active Problem List   Diagnosis Date Noted  . Diverticulosis 06/29/2017  . Bipolar disorder ([place]) 01/26/2016  . Depression 05/09/2014  . Narcolepsy 05/09/2014  . Migraines 04/19/2013  . Fibromyalgia muscle pain 03/07/2013  . PCOS (polycystic ovarian syndrome) 03/07/2013    Standley Brooking, PTA 08/03/2018, 3:49 PM  [facility] [address] [place], [place], [address] Phone: [phone]   Fax:  [phone]  Name: Rachael Allen MRN: [id number] Date of Birth: [birth year]

## 2018-08-08 ENCOUNTER — Encounter: Payer: Self-pay | Admitting: Physical Therapy

## 2018-08-08 ENCOUNTER — Ambulatory Visit: Payer: BLUE CROSS/BLUE SHIELD | Admitting: Physical Therapy

## 2018-08-08 DIAGNOSIS — M6281 Muscle weakness (generalized): Secondary | ICD-10-CM | POA: Diagnosis not present

## 2018-08-08 DIAGNOSIS — M25512 Pain in left shoulder: Secondary | ICD-10-CM | POA: Diagnosis not present

## 2018-08-08 NOTE — Therapy (Signed)
[facility] [place], [place], [address] Phone: [phone]   Fax:  [phone]  Physical Therapy Treatment  Patient Details  Name: CYDNEE FUQUAY MRN: [id number] Date of Birth: [birth year] Referring Provider: Frederik Pear, MD   Encounter Date: 08/08/2018  PT End of Session - 08/08/18 1128    Visit Number  7    Number of Visits  13    Authorization Type  BCBS    PT Start Time  1030    PT Stop Time  1130    PT Time Calculation (min)  60 min       Past Medical History:  Diagnosis Date  . Bipolar disorder ([place])   . Depression     Past Surgical History:  Procedure Laterality Date  . CESAREAN SECTION    . TONSILLECTOMY AND ADENOIDECTOMY      There were no vitals filed for this visit.  Subjective Assessment - 08/08/18 1129    Subjective  Minimal pain today.     Currently in Pain?  Yes    Pain Score  2     Pain Location  Shoulder    Pain Orientation  Left    Pain Descriptors / Indicators  Aching;Sore    Pain Onset  More than a month ago                       [facility] Adult PT Treatment/Exercise - 08/08/18 0001      Exercises   Exercises  Shoulder      Shoulder Exercises: Pulleys   Flexion  --   5 minutes.     Shoulder Exercises: ROM/Strengthening   UBE (Upper Arm Bike)  90 RPM's x 8 minutes.      Acupuncturist Location  Left shoulder.    Electrical Stimulation Action  IFC    Electrical Stimulation Parameters  80-150 Hz x 20 minutes.    Electrical Stimulation Goals  Pain      Manual Therapy   Passive ROM  In supine:  Left shoulder stretching into ER and flexion which also included anterior capsular stretching (10 minutes).               PT Short Term Goals - 07/18/18 1234      PT SHORT TERM GOAL #1   Title  STG's = LTG's        PT Long Term Goals - 08/01/18 1147      PT LONG TERM GOAL #1   Title  Pt will be independent in her HEP and  progression.     Baseline  Met 4/5.    Time  6    Period  Weeks    Status  Achieved      PT LONG TERM GOAL #2   Title  Pt will be able to improve left shoulder flexion to >/= 150 degrees in order to improve functional mobility.     Time  6    Period  Weeks    Status  On-going      PT LONG TERM GOAL #3   Title  Pt will be able to increase her left shoulder IR to >/= T10 level in order to improve funcitonal mobility.     Time  6    Period  Weeks    Status  On-going      PT LONG TERM GOAL #4   Title  Pt will be able to lift  a bag of groceries with pain </= 2/10.     Time  6    Period  Weeks    Status  On-going      PT LONG TERM GOAL #5   Title  Pt will improve her left grip strength to >/= 40ppsi.     Period  Weeks    Status  On-going            Plan - 08/08/18 1159    Clinical Impression Statement  Patient is doing very well with PT.  She is very pleased with her progress but still lacks some left shoulder ER and behind the back movement.      PT Treatment/Interventions  ADLs/Self Care Home Management;Electrical Stimulation;Iontophoresis 44m/ml Dexamethasone;Moist Heat;Ultrasound;Functional mobility training;Therapeutic activities;Therapeutic exercise;Manual techniques;Patient/family education;Passive range of motion;Dry needling;Taping;Cryotherapy    Consulted and Agree with Plan of Care  Patient       Patient will benefit from skilled therapeutic intervention in order to improve the following deficits and impairments:     Visit Diagnosis: Acute pain of left shoulder  Muscle weakness (generalized)     Problem List Patient Active Problem List   Diagnosis Date Noted  . Diverticulosis 06/29/2017  . [place] ([place] 01/26/2016  . Depression 05/09/2014  . Narcolepsy 05/09/2014  . Migraines 04/19/2013  . Fibromyalgia muscle pain 03/07/2013  . PCOS (polycystic ovarian syndrome) 03/07/2013    Jayme Cham, CMaliMPT 08/08/2018, 12:03 PM  [facility] [address]Phone: [phone]  Fax:  [phone] Name: AJALIANA MEDELLINMRN: [id number] of Birth: [birth year]

## 2018-08-10 ENCOUNTER — Encounter: Payer: Medicare HMO | Admitting: Physical Therapy

## 2018-08-11 ENCOUNTER — Encounter: Payer: Self-pay | Admitting: Physical Therapy

## 2018-08-11 ENCOUNTER — Ambulatory Visit: Payer: BLUE CROSS/BLUE SHIELD | Admitting: Physical Therapy

## 2018-08-11 DIAGNOSIS — M25512 Pain in left shoulder: Secondary | ICD-10-CM

## 2018-08-11 DIAGNOSIS — M6281 Muscle weakness (generalized): Secondary | ICD-10-CM

## 2018-08-11 NOTE — Therapy (Signed)
[facility] [place], [place], [address] Phone: [phone]   Fax:  [phone]  Physical Therapy Treatment  Patient Details  Name: Rachael Allen MRN: [id number] Date of Birth: [birth year] Referring Provider: Frederik Pear, MD   Encounter Date: 08/11/2018  PT End of Session - 08/11/18 1329    Visit Number  8    Number of Visits  13    Authorization Type  BCBS    PT Start Time  6295    PT Stop Time  1123    PT Time Calculation (min)  53 min    Activity Tolerance  Patient tolerated treatment well    Behavior During Therapy  [facility] for tasks assessed/performed       Past Medical History:  Diagnosis Date  . Bipolar disorder ([place])   . Depression     Past Surgical History:  Procedure Laterality Date  . CESAREAN SECTION    . TONSILLECTOMY AND ADENOIDECTOMY      There were no vitals filed for this visit.  Subjective Assessment - 08/11/18 1330    Subjective  Patient pleased with her progress.         [facility] PT Assessment - 08/11/18 0001      AROM   Left Shoulder External Rotation  --   70 degrees.                  [facility] Adult PT Treatment/Exercise - 08/11/18 0001      Exercises   Exercises  Shoulder      Shoulder Exercises: Pulleys   Flexion  --   6 minutes.   Other Pulley Exercises  UE ranger on wall moving into left shoulder ER x 4 minutes.      Shoulder Exercises: ROM/Strengthening   UBE (Upper Arm Bike)  90 RPM's x 8 minutes.      Modalities   Modalities  Electrical Stimulation;Moist Heat      Moist Heat Therapy   Number Minutes Moist Heat  20 Minutes    Moist Heat Location  --   Left shoulder.     Acupuncturist Location  Left shoulder.    Electrical Stimulation Action  IFC    Electrical Stimulation Parameters  80-150 Hz x 20 minutes.    Electrical Stimulation Goals  Pain      Manual Therapy   Manual Therapy  Passive ROM    Passive ROM  Left  shoulder PROM x 5 minutes into ER.             PT Education - 08/11/18 1328    Education Details  Low load long duration stretching into left shoulder ER with 1# dumbbell.    Person(s) Educated  Patient    Methods  Explanation;Demonstration    Comprehension  Verbalized understanding;Need further instruction       PT Short Term Goals - 07/18/18 1234      PT SHORT TERM GOAL #1   Title  STG's = LTG's        PT Long Term Goals - 08/01/18 1147      PT LONG TERM GOAL #1   Title  Pt will be independent in her HEP and progression.     Baseline  Met 4/5.    Time  6    Period  Weeks    Status  Achieved      PT LONG TERM GOAL #2   Title  Pt will be able to  improve left shoulder flexion to >/= 150 degrees in order to improve functional mobility.     Time  6    Period  Weeks    Status  On-going      PT LONG TERM GOAL #3   Title  Pt will be able to increase her left shoulder IR to >/= T10 level in order to improve funcitonal mobility.     Time  6    Period  Weeks    Status  On-going      PT LONG TERM GOAL #4   Title  Pt will be able to lift a bag of groceries with pain </= 2/10.     Time  6    Period  Weeks    Status  On-going      PT LONG TERM GOAL #5   Title  Pt will improve her left grip strength to >/= 40ppsi.     Period  Weeks    Status  On-going            Plan - 08/11/18 1341    Clinical Impression Statement  Patient progressing very well with ER improved to 70 degrees.    PT Treatment/Interventions  ADLs/Self Care Home Management;Electrical Stimulation;Iontophoresis 79m/ml Dexamethasone;Moist Heat;Ultrasound;Functional mobility training;Therapeutic activities;Therapeutic exercise;Manual techniques;Patient/family education;Passive range of motion;Dry needling;Taping;Cryotherapy    PT Next Visit Plan  Shoulder ROM, gentle strengthening, modalities as needed    PT Home Exercise Plan  shoulder flexion with cane, ER with cane in supine, 4 way shouder  isometrics, extension with towel, and IR with towel     Consulted and Agree with Plan of Care  Patient       Patient will benefit from skilled therapeutic intervention in order to improve the following deficits and impairments:  Pain, Postural dysfunction, Decreased range of motion, Decreased strength, Impaired UE functional use  Visit Diagnosis: Acute pain of left shoulder  Muscle weakness (generalized)     Problem List Patient Active Problem List   Diagnosis Date Noted  . Diverticulosis 06/29/2017  . B[place] disorder ([place] 01/26/2016  . Depression 05/09/2014  . Narcolepsy 05/09/2014  . Migraines 04/19/2013  . Fibromyalgia muscle pain 03/07/2013  . PCOS (polycystic ovarian syndrome) 03/07/2013    Carling Liberman, CMaliMPT 08/11/2018, 1:42 PM  [facility] [address]Phone: [phone]  Fax:  [phone] Name: Rachael KLASENMRN: [id number] of Birth: [birth year]

## 2018-08-15 ENCOUNTER — Ambulatory Visit: Payer: BLUE CROSS/BLUE SHIELD | Attending: Orthopedic Surgery | Admitting: Physical Therapy

## 2018-08-15 ENCOUNTER — Encounter: Payer: Self-pay | Admitting: Physical Therapy

## 2018-08-15 DIAGNOSIS — M25512 Pain in left shoulder: Secondary | ICD-10-CM | POA: Diagnosis not present

## 2018-08-15 DIAGNOSIS — M6281 Muscle weakness (generalized): Secondary | ICD-10-CM | POA: Insufficient documentation

## 2018-08-15 NOTE — Therapy (Signed)
[facility] [place], [place], [address] Phone: [phone]   Fax:  [phone]  Physical Therapy Treatment  Patient Details  Name: Rachael Allen MRN: [id number] Date of Birth: March 27, 1971 Referring Provider: Frederik Pear, MD   Encounter Date: 08/15/2018  PT End of Session - 08/15/18 1120    Visit Number  9    Number of Visits  13    Authorization Type  BCBS    PT Start Time  3614    PT Stop Time  1206    PT Time Calculation (min)  48 min    Activity Tolerance  Patient tolerated treatment well    Behavior During Therapy  [facility] for tasks assessed/performed       Past Medical History:  Diagnosis Date  . Bipolar disorder ([place])   . Depression     Past Surgical History:  Procedure Laterality Date  . CESAREAN SECTION    . TONSILLECTOMY AND ADENOIDECTOMY      There were no vitals filed for this visit.  Subjective Assessment - 08/15/18 1120    Subjective  Patient denies any pain.    Pertinent History  C-section, L knee surgery, L and R foot surgery (ganglion cysts)    Limitations  House hold activities    Patient Stated Goals  stop hurting,     Currently in Pain?  No/denies         [facility] PT Assessment - 08/15/18 0001      Assessment   Medical Diagnosis  L shoulder pain    Onset Date/Surgical Date  03/18/18    Hand Dominance  Right    Prior Therapy  no      Precautions   Precautions  None      Restrictions   Weight Bearing Restrictions  No      ROM / Strength   AROM / PROM / Strength  Strength   L grip      AROM   AROM Assessment Site  --    Right/Left Forearm  --    Right/Left Wrist  --      Strength   Strength Assessment Site  Hand    Right/Left hand  Left    Left Hand Grip (lbs)  19                   [facility] Adult PT Treatment/Exercise - 08/15/18 0001      Exercises   Exercises  Shoulder      Shoulder Exercises: Prone   Retraction  Strengthening;Left;Weights;Limitations    Retraction Weight (lbs)  3    Retraction Limitations  3x10 reps    Flexion  Strengthening;Left;Weights;Limitations    Flexion Weight (lbs)  3    Flexion Limitations  3x10 reps    Extension  Strengthening;Left;Weights    Extension Weight (lbs)  3    Extension Limitations  3x10 reps    Horizontal ABduction 1  Strengthening;Left;Weights;Limitations    Horizontal ABduction 1 Weight (lbs)  3    Horizontal ABduction 1 Limitations  3x10 reps      Shoulder Exercises: Standing   Protraction  Strengthening;Left;Theraband;Limitations    Theraband Level (Shoulder Protraction)  Level 3 (Green)    Protraction Limitations  3x10 reps    External Rotation  Strengthening;Left;Theraband;Limitations    Theraband Level (Shoulder External Rotation)  Level 3 (Green)    External Rotation Limitations  3x10 reps    Internal Rotation  Strengthening;Left;Theraband;Limitations    Theraband Level (Shoulder Internal  Rotation)  Level 3 (Green)    Internal Rotation Limitations  3x10 reps    Extension  Strengthening;Left;Theraband;Limitations    Theraband Level (Shoulder Extension)  Level 3 (Green)    Extension Limitations  3x10 reps    Row  Strengthening;Left;Theraband;Limitations    Theraband Level (Shoulder Row)  Level 3 (Green)    Row Limitations  3x10 reps    Diagonals  Strengthening;Left;20 reps;Weights    Diagonals Weight (lbs)  2    Diagonals Limitations  D1 and D2     Other Standing Exercises  L shoulder wall clocks 2# ball x10 reps    Other Standing Exercises  Wall walks green theraband x5 reps      Shoulder Exercises: Pulleys   Flexion  5 minutes      Shoulder Exercises: ROM/Strengthening   UBE (Upper Arm Bike)  90 RPM's x 6 minutes.      Modalities   Modalities  Electrical Stimulation;Moist Heat      Moist Heat Therapy   Number Minutes Moist Heat  15 Minutes    Moist Heat Location  Shoulder      Electrical Stimulation   Electrical Stimulation Location  L shoulder    Electrical Stimulation  Action  IFC    Electrical Stimulation Parameters  80-150 hz x15 min    Electrical Stimulation Goals  Pain               PT Short Term Goals - 07/18/18 1234      PT SHORT TERM GOAL #1   Title  STG's = LTG's        PT Long Term Goals - 08/01/18 1147      PT LONG TERM GOAL #1   Title  Pt will be independent in her HEP and progression.     Baseline  Met 4/5.    Time  6    Period  Weeks    Status  Achieved      PT LONG TERM GOAL #2   Title  Pt will be able to improve left shoulder flexion to >/= 150 degrees in order to improve functional mobility.     Time  6    Period  Weeks    Status  On-going      PT LONG TERM GOAL #3   Title  Pt will be able to increase her left shoulder IR to >/= T10 level in order to improve funcitonal mobility.     Time  6    Period  Weeks    Status  On-going      PT LONG TERM GOAL #4   Title  Pt will be able to lift a bag of groceries with pain </= 2/10.     Time  6    Period  Weeks    Status  On-going      PT LONG TERM GOAL #5   Title  Pt will improve her left grip strength to >/= 40ppsi.     Period  Weeks    Status  On-going            Plan - 08/15/18 1204    Clinical Impression Statement  Patient tolerated today's treatment well as she denied pain but she reports intermittant pain with lifiting heavy objects such as gallon of milk. Patient guided through various L shoulder strengthening exercises with only complaints of fatigue and weakness especially with abduction. L wrist strength assessed as 20 psi today. Normal modalities response noted following removal of  the modalities.    Rehab Potential  Excellent    PT Frequency  2x / week    PT Duration  6 weeks    PT Treatment/Interventions  ADLs/Self Care Home Management;Electrical Stimulation;Iontophoresis 8m/ml Dexamethasone;Moist Heat;Ultrasound;Functional mobility training;Therapeutic activities;Therapeutic exercise;Manual techniques;Patient/family education;Passive range of  motion;Dry needling;Taping;Cryotherapy    PT Next Visit Plan  Shoulder ROM, gentle strengthening, modalities as needed    PT Home Exercise Plan  shoulder flexion with cane, ER with cane in supine, 4 way shouder isometrics, extension with towel, and IR with towel     Consulted and Agree with Plan of Care  Patient       Patient will benefit from skilled therapeutic intervention in order to improve the following deficits and impairments:  Pain, Postural dysfunction, Decreased range of motion, Decreased strength, Impaired UE functional use  Visit Diagnosis: Acute pain of left shoulder  Muscle weakness (generalized)     Problem List Patient Active Problem List   Diagnosis Date Noted  . Diverticulosis 06/29/2017  . Bipolar disorder ([place] 01/26/2016  . Depression 05/09/2014  . Narcolepsy 05/09/2014  . Migraines 04/19/2013  . Fibromyalgia muscle pain 03/07/2013  . PCOS (polycystic ovarian syndrome) 03/07/2013    KStandley Brooking PTA 08/15/2018, 12:12 PM  [facility] [address]Phone: [phone]  Fax:  [phone] Name: Rachael FURNISSMRN: [id number] of Birth: [birth year]

## 2018-08-15 NOTE — Progress Notes (Signed)
Rachael Allen unit [place]

## 2018-08-17 ENCOUNTER — Encounter: Payer: Self-pay | Admitting: Physical Therapy

## 2018-08-17 ENCOUNTER — Ambulatory Visit: Payer: BLUE CROSS/BLUE SHIELD | Admitting: Physical Therapy

## 2018-08-17 DIAGNOSIS — M25512 Pain in left shoulder: Secondary | ICD-10-CM | POA: Diagnosis not present

## 2018-08-17 DIAGNOSIS — M6281 Muscle weakness (generalized): Secondary | ICD-10-CM

## 2018-08-17 NOTE — Therapy (Signed)
[facility] [place], [place], [address] Phone: [phone]   Fax:  [phone]  Physical Therapy Treatment  Patient Details  Name: TANNIS BURSTEIN MRN: [id number] Date of Birth: [birth year] Referring Provider: Frederik Pear, MD   Encounter Date: 08/17/2018  PT End of Session - 08/17/18 1247    Visit Number  10    Number of Visits  13    Authorization Type  BCBS    PT Start Time  9509    PT Stop Time  1207    PT Time Calculation (min)  51 min    Activity Tolerance  Patient tolerated treatment well    Behavior During Therapy  [facility] for tasks assessed/performed       Past Medical History:  Diagnosis Date  . Bipolar disorder ([place])   . Depression     Past Surgical History:  Procedure Laterality Date  . CESAREAN SECTION    . TONSILLECTOMY AND ADENOIDECTOMY      There were no vitals filed for this visit.  Subjective Assessment - 08/17/18 1248    Subjective  I'm doing much better.    Currently in Pain?  Yes    Pain Score  2     Pain Location  Shoulder    Pain Orientation  Left    Pain Descriptors / Indicators  Aching;Sore    Pain Type  Acute pain    Pain Onset  More than a month ago                       [facility] Adult PT Treatment/Exercise - 08/17/18 0001      Shoulder Exercises: Pulleys   Other Pulley Exercises  UE Ranger on wall x 5 minutes.      Shoulder Exercises: ROM/Strengthening   UBE (Upper Arm Bike)  90 RPM's x 8 minutes.      Modalities   Modalities  Electrical Stimulation;Moist Heat      Moist Heat Therapy   Number Minutes Moist Heat  20 Minutes    Moist Heat Location  --   Left shoulder.     Acupuncturist Location  Left shoulder.    Electrical Stimulation Action  IFC    Electrical Stimulation Parameters  80-150 Hz x 20 minutes.    Electrical Stimulation Goals  Pain      Manual Therapy   Passive ROM  Sustained passive stretch into left  shoulder ER in plane of scapula x 8 minutes while receiving U/S at 1.50 W/CM2 x 8 minutes.               PT Short Term Goals - 07/18/18 1234      PT SHORT TERM GOAL #1   Title  STG's = LTG's        PT Long Term Goals - 08/01/18 1147      PT LONG TERM GOAL #1   Title  Pt will be independent in her HEP and progression.     Baseline  Met 4/5.    Time  6    Period  Weeks    Status  Achieved      PT LONG TERM GOAL #2   Title  Pt will be able to improve left shoulder flexion to >/= 150 degrees in order to improve functional mobility.     Time  6    Period  Weeks    Status  On-going  PT LONG TERM GOAL #3   Title  Pt will be able to increase her left shoulder IR to >/= T10 level in order to improve funcitonal mobility.     Time  6    Period  Weeks    Status  On-going      PT LONG TERM GOAL #4   Title  Pt will be able to lift a bag of groceries with pain </= 2/10.     Time  6    Period  Weeks    Status  On-going      PT LONG TERM GOAL #5   Title  Pt will improve her left grip strength to >/= 40ppsi.     Period  Weeks    Status  On-going            Plan - 08/17/18 1253    Clinical Impression Statement  See "Therapy Note" section.    PT Treatment/Interventions  ADLs/Self Care Home Management;Electrical Stimulation;Iontophoresis 61m/ml Dexamethasone;Moist Heat;Ultrasound;Functional mobility training;Therapeutic activities;Therapeutic exercise;Manual techniques;Patient/family education;Passive range of motion;Dry needling;Taping;Cryotherapy    PT Next Visit Plan  Shoulder ROM, gentle strengthening, modalities as needed    PT Home Exercise Plan  shoulder flexion with cane, ER with cane in supine, 4 way shouder isometrics, extension with towel, and IR with towel     Consulted and Agree with Plan of Care  Patient       Patient will benefit from skilled therapeutic intervention in order to improve the following deficits and impairments:  Pain, Postural  dysfunction, Decreased range of motion, Decreased strength, Impaired UE functional use  Visit Diagnosis: Muscle weakness (generalized)  Acute pain of left shoulder     Problem List Patient Active Problem List   Diagnosis Date Noted  . Diverticulosis 06/29/2017  . Bipolar disorder ([place] 01/26/2016  . Depression 05/09/2014  . Narcolepsy 05/09/2014  . Migraines 04/19/2013  . Fibromyalgia muscle pain 03/07/2013  . PCOS (polycystic ovarian syndrome) 03/07/2013   Progress Note Reporting Period 07/18/18 to 08/17/18.  See note below for Objective Data and Assessment of Progress/Goals. Excellent progress with regards to increased left shoulder range of motion and minimal pain complaints today.     Melodie Ashworth, CMaliMPT 08/17/2018, 1:00 PM  [facility] [address]Phone: [phone]  Fax:  [phone] Name: AROSELLE NORTONMRN: [id number] of Birth: 107/17/1972

## 2018-08-22 ENCOUNTER — Encounter: Payer: Medicare HMO | Admitting: Physical Therapy

## 2018-08-24 ENCOUNTER — Encounter: Payer: Self-pay | Admitting: Physical Therapy

## 2018-08-24 ENCOUNTER — Ambulatory Visit: Payer: BLUE CROSS/BLUE SHIELD | Admitting: Physical Therapy

## 2018-08-24 DIAGNOSIS — M25512 Pain in left shoulder: Secondary | ICD-10-CM | POA: Diagnosis not present

## 2018-08-24 DIAGNOSIS — M6281 Muscle weakness (generalized): Secondary | ICD-10-CM

## 2018-08-24 NOTE — Therapy (Signed)
[facility] [place], [place], [address] Phone: [phone]   Fax:  [phone]  Physical Therapy Treatment  Patient Details  Name: Rachael Allen MRN: [id number] Date of Birth: 1971/02/14 Referring Provider: Frederik Pear, MD   Encounter Date: 08/24/2018  PT End of Session - 08/24/18 1200    Visit Number  11    Number of Visits  13    Authorization Type  BCBS    PT Start Time  1117    PT Stop Time  1208    PT Time Calculation (min)  51 min    Activity Tolerance  Patient tolerated treatment well    Behavior During Therapy  [facility] for tasks assessed/performed       Past Medical History:  Diagnosis Date  . Bipolar disorder ([place])   . Depression     Past Surgical History:  Procedure Laterality Date  . CESAREAN SECTION    . TONSILLECTOMY AND ADENOIDECTOMY      There were no vitals filed for this visit.  Subjective Assessment - 08/24/18 1139    Subjective  Pt arriving to therapy reporting no pain at rest, but pain with reaching behind her back to fasten her bra, putting on her deododrant, and fixing her hair.     Pertinent History  C-section, L knee surgery, L and R foot surgery (ganglion cysts)    Limitations  House hold activities    Patient Stated Goals  stop hurting,     Currently in Pain?  No/denies    Aggravating Factors   reaching upward, reaching backward or drying to donn her bra and fix her hair    Pain Relieving Factors  resting         [facility] PT Assessment - 08/24/18 0001      Assessment   Medical Diagnosis  L shoulder pain    Onset Date/Surgical Date  03/18/18    Hand Dominance  Right    Prior Therapy  no      Precautions   Precautions  None      Restrictions   Weight Bearing Restrictions  No      Observation/Other Assessments   Focus on Therapeutic Outcomes (FOTO)   37% limitation      AROM   AROM Assessment Site  Shoulder    Right/Left Shoulder  Left    Left Shoulder Flexion  135  Degrees    Left Shoulder External Rotation  50 Degrees      Strength   Left Shoulder Flexion  4/5    Left Shoulder Extension  4/5    Left Shoulder ABduction  4-/5    Left Shoulder Internal Rotation  4-/5    Left Shoulder External Rotation  4-/5                   [facility] Adult PT Treatment/Exercise - 08/24/18 0001      Shoulder Exercises: Pulleys   Other Pulley Exercises  UE Ranger on wall x 5 minutes. (flexion, clockwise and counterclock wise circles)      Shoulder Exercises: ROM/Strengthening   UBE (Upper Arm Bike)  90 RPM's x 8 minutes.      Modalities   Modalities  Electrical Stimulation;Moist Heat      Moist Heat Therapy   Number Minutes Moist Heat  15 Minutes    Moist Heat Location  Shoulder      Electrical Stimulation   Electrical Stimulation Location  left    Electrical Stimulation  Action  IFC    Electrical Stimulation Parameters  80-150 Hz x 15 minutes    Electrical Stimulation Goals  Pain      Manual Therapy   Manual Therapy  Passive ROM    Passive ROM  PROM: flexion, ER and IR in sidelying               PT Short Term Goals - 07/18/18 1234      PT SHORT TERM GOAL #1   Title  STG's = LTG's        PT Long Term Goals - 08/24/18 1218      PT LONG TERM GOAL #1   Title  Pt will be independent in her HEP and progression.     Baseline  Met 4/5.    Status  Achieved      PT LONG TERM GOAL #2   Title  Pt will be able to improve left shoulder flexion to >/= 150 degrees in order to improve functional mobility.     Baseline  140 PROM on 08/24/18    Status  On-going      PT LONG TERM GOAL #3   Title  Pt will be able to increase her left shoulder IR to >/= T10 level in order to improve funcitonal mobility.     Time  6    Period  Weeks    Status  On-going      PT LONG TERM GOAL #4   Title  Pt will be able to lift a bag of groceries with pain </= 2/10.     Baseline  pt reporting able to met this goal.     Status  Achieved      PT LONG TERM  GOAL #5   Title  Pt will improve her left grip strength to >/= 40ppsi.     Time  6    Period  Weeks    Status  On-going            Plan - 08/24/18 1200    Clinical Impression Statement  Pt tolerating all exercises well with no reports of increased pain during session. Pt still reporting pain with ADL's. Pt has improved her FOTO score from 50% limitation to 37% limitation and has improved her MMT from grade 3 to grade 4. Continue skilled PT to  progress  toward LTG''s set.        Patient will benefit from skilled therapeutic intervention in order to improve the following deficits and impairments:     Visit Diagnosis: No diagnosis found.     Problem List Patient Active Problem List   Diagnosis Date Noted  . Diverticulosis 06/29/2017  . Bipolar disorder ([place]) 01/26/2016  . Depression 05/09/2014  . Narcolepsy 05/09/2014  . Migraines 04/19/2013  . Fibromyalgia muscle pain 03/07/2013  . PCOS (polycystic ovarian syndrome) 03/07/2013    Oretha Caprice, MPT 08/24/2018, 12:29 PM  [facility] [place], [place], [address] Phone: [phone]   Fax:  [phone]  Name: Rachael Allen MRN: [id number] Date of Birth: February 08, 1971

## 2018-08-29 ENCOUNTER — Encounter: Payer: Self-pay | Admitting: Physical Therapy

## 2018-08-29 ENCOUNTER — Ambulatory Visit: Payer: BLUE CROSS/BLUE SHIELD | Admitting: Physical Therapy

## 2018-08-29 DIAGNOSIS — M6281 Muscle weakness (generalized): Secondary | ICD-10-CM

## 2018-08-29 DIAGNOSIS — M25512 Pain in left shoulder: Secondary | ICD-10-CM

## 2018-08-29 NOTE — Therapy (Signed)
[facility] [place], [place], [address] Phone: [phone]   Fax:  [phone]  Physical Therapy Treatment  Patient Details  Name: Rachael Allen MRN: [id number] Date of Birth: 12/24/1970 Referring Provider: Frederik Pear, MD   Encounter Date: 08/29/2018  PT End of Session - 08/29/18 1121    Visit Number  12    Number of Visits  13    Authorization Type  BCBS    PT Start Time  6389    PT Stop Time  1202    PT Time Calculation (min)  46 min    Activity Tolerance  Patient tolerated treatment well    Behavior During Therapy  [facility] for tasks assessed/performed       Past Medical History:  Diagnosis Date  . Bipolar disorder ([place])   . Depression     Past Surgical History:  Procedure Laterality Date  . CESAREAN SECTION    . TONSILLECTOMY AND ADENOIDECTOMY      There were no vitals filed for this visit.  Subjective Assessment - 08/29/18 1120    Subjective  Reports pain and issues with reaching behind her back.    Pertinent History  C-section, L knee surgery, L and R foot surgery (ganglion cysts)    Limitations  House hold activities    Patient Stated Goals  stop hurting,     Currently in Pain?  No/denies         [facility] PT Assessment - 08/29/18 0001      Assessment   Medical Diagnosis  L shoulder pain    Onset Date/Surgical Date  03/18/18    Hand Dominance  Right    Prior Therapy  no      Precautions   Precautions  None      Restrictions   Weight Bearing Restrictions  No      ROM / Strength   AROM / PROM / Strength  Strength      Strength   Overall Strength  Deficits    Strength Assessment Site  Hand    Right/Left hand  Left;Right    Right Hand Grip (lbs)  58    Left Hand Grip (lbs)  60                   [facility] Adult PT Treatment/Exercise - 08/29/18 0001      Shoulder Exercises: Standing   Protraction  Strengthening;Left;Theraband;Limitations    Theraband Level (Shoulder Protraction)   Level 3 (Green)    Protraction Limitations  3x10 reps    External Rotation  Strengthening;Left;Theraband;Limitations    Theraband Level (Shoulder External Rotation)  Level 3 (Green)    External Rotation Limitations  3x10 reps    Internal Rotation  Strengthening;Left;Theraband;Limitations    Theraband Level (Shoulder Internal Rotation)  Level 3 (Green)    Internal Rotation Limitations  3x10 reps; AAROM x20 reps    Extension  Strengthening;Left;Theraband;Limitations    Theraband Level (Shoulder Extension)  Level 3 (Green)    Extension Limitations  3x10 reps; AAROM x20 reps    Row  Strengthening;Left;Theraband;Limitations    Theraband Level (Shoulder Row)  Level 3 (Green)    Row Limitations  3x10 reps      Shoulder Exercises: Pulleys   Flexion  5 minutes      Modalities   Modalities  Electrical Stimulation;Moist Heat      Moist Heat Therapy   Number Minutes Moist Heat  15 Minutes    Moist Heat Location  Shoulder      Electrical Stimulation   Electrical Stimulation Location  L supraspinatus/deltoids    Electrical Stimulation Action  Pre-Mod    Electrical Stimulation Parameters  80-150 hz x15 min    Electrical Stimulation Goals  Pain      Manual Therapy   Manual Therapy  Joint mobilization;Passive ROM    Joint Mobilization  Grade II A/P/I glenohumeral joint mobs followed by PROM    Passive ROM  PROM of L shoulder into flexion, ER, IR              PT Education - 08/29/18 1203    Education Details  HEP- DN consent form    Person(s) Educated  Patient    Methods  Explanation;Handout    Comprehension  Verbalized understanding       PT Short Term Goals - 07/18/18 1234      PT SHORT TERM GOAL #1   Title  STG's = LTG's        PT Long Term Goals - 08/24/18 1218      PT LONG TERM GOAL #1   Title  Pt will be independent in her HEP and progression.     Baseline  Met 4/5.    Status  Achieved      PT LONG TERM GOAL #2   Title  Pt will be able to improve left shoulder  flexion to >/= 150 degrees in order to improve functional mobility.     Baseline  140 PROM on 08/24/18    Status  On-going      PT LONG TERM GOAL #3   Title  Pt will be able to increase her left shoulder IR to >/= T10 level in order to improve funcitonal mobility.     Time  6    Period  Weeks    Status  On-going      PT LONG TERM GOAL #4   Title  Pt will be able to lift a bag of groceries with pain </= 2/10.     Baseline  pt reporting able to met this goal.     Status  Achieved      PT LONG TERM GOAL #5   Title  Pt will improve her left grip strength to >/= 40ppsi.     Time  6    Period  Weeks    Status  On-going            Plan - 08/29/18 1204    Clinical Impression Statement  Patient demonstrated improvement today although still limited with IR exercises. Patient tender along L UT, supraspinatus region with radiation of pain experienced along mid deltoids. Tightness in anterior and posterior mobilzations of L shoulder noted with patient reporting mid deltoid discomfort with ER. Normal modalities response noted following removal of the modalities. Patient provided education regarding DN and provided consent form.    Rehab Potential  Excellent    PT Frequency  2x / week    PT Duration  6 weeks    PT Treatment/Interventions  ADLs/Self Care Home Management;Electrical Stimulation;Iontophoresis 28m/ml Dexamethasone;Moist Heat;Ultrasound;Functional mobility training;Therapeutic activities;Therapeutic exercise;Manual techniques;Patient/family education;Passive range of motion;Dry needling;Taping;Cryotherapy    PT Next Visit Plan  Shoulder ROM, gentle strengthening, modalities as needed    PT Home Exercise Plan  shoulder flexion with cane, ER with cane in supine, 4 way shouder isometrics, extension with towel, and IR with towel     Consulted and Agree with Plan of Care  Patient  Patient will benefit from skilled therapeutic intervention in order to improve the following deficits  and impairments:  Pain, Postural dysfunction, Decreased range of motion, Decreased strength, Impaired UE functional use  Visit Diagnosis: Acute pain of left shoulder  Muscle weakness (generalized)     Problem List Patient Active Problem List   Diagnosis Date Noted  . Diverticulosis 06/29/2017  . Bipolar disorder ([place]) 01/26/2016  . Depression 05/09/2014  . Narcolepsy 05/09/2014  . Migraines 04/19/2013  . Fibromyalgia muscle pain 03/07/2013  . PCOS (polycystic ovarian syndrome) 03/07/2013    Standley Brooking, PTA 08/29/2018, 12:08 PM  [facility] [address] [place], [place], [address] Phone: [phone]   Fax:  [phone]  Name: Rachael Allen MRN: [id number] Date of Birth: Aug 17, 1971

## 2018-08-31 ENCOUNTER — Encounter: Payer: Medicare HMO | Admitting: *Deleted

## 2018-09-01 ENCOUNTER — Encounter: Payer: Self-pay | Admitting: Physical Therapy

## 2018-09-01 ENCOUNTER — Ambulatory Visit: Payer: BLUE CROSS/BLUE SHIELD | Admitting: Physical Therapy

## 2018-09-01 ENCOUNTER — Other Ambulatory Visit: Payer: Self-pay

## 2018-09-01 DIAGNOSIS — M25512 Pain in left shoulder: Secondary | ICD-10-CM

## 2018-09-01 DIAGNOSIS — M6281 Muscle weakness (generalized): Secondary | ICD-10-CM

## 2018-09-01 NOTE — Therapy (Signed)
[facility] [place], [place], [address] Phone: [phone]   Fax:  [phone]  Physical Therapy Treatment  Patient Details  Name: Rachael Allen MRN: [id number] Date of Birth: [birth year] Referring Provider: Frederik Pear, MD   Encounter Date: 09/01/2018  PT End of Session - 09/01/18 1113    Visit Number  13    Number of Visits  13    Authorization Type  BCBS    PT Start Time  1115    PT Stop Time  1210    PT Time Calculation (min)  55 min    Activity Tolerance  Patient tolerated treatment well    Behavior During Therapy  [facility] for tasks assessed/performed       Past Medical History:  Diagnosis Date  . Bipolar disorder ([place])   . Depression     Past Surgical History:  Procedure Laterality Date  . CESAREAN SECTION    . TONSILLECTOMY AND ADENOIDECTOMY      There were no vitals filed for this visit.  Subjective Assessment - 09/01/18 1149    Subjective  Pt reporting no pain at rest only mild tenderness with reaching backward to attempt  fasten her bra. Pt is still unable to fasten her bra in the back.     Pertinent History  C-section, L knee surgery, L and R foot surgery (ganglion cysts)    Limitations  House hold activities    Patient Stated Goals  stop hurting,  I would like to improve my range of motion.     Currently in Pain?  No/denies    Pain Orientation  Left    Aggravating Factors   reaching backward when attempting to reach bra and reaching upward and toward the back of head    Pain Relieving Factors  resting         [facility] PT Assessment - 09/01/18 0001      Assessment   Medical Diagnosis  L shoulder pain    Onset Date/Surgical Date  03/18/18    Hand Dominance  Right    Prior Therapy  no      Precautions   Precautions  None      Restrictions   Weight Bearing Restrictions  No      ROM / Strength   AROM / PROM / Strength  PROM      PROM   PROM Assessment Site  Shoulder    Right/Left Shoulder   Left    Left Shoulder Flexion  155 Degrees    Left Shoulder ABduction  145 Degrees    Left Shoulder Internal Rotation  --   thumb to T12   Left Shoulder External Rotation  62 Degrees      Strength   Right/Left hand  Right;Left    Right Hand Grip (lbs)  58    Left Hand Grip (lbs)  55                   [facility] Adult PT Treatment/Exercise - 09/01/18 0001      Shoulder Exercises: Standing   Protraction  Strengthening;Left;20 reps;Theraband;Limitations    Theraband Level (Shoulder Protraction)  Level 3 (Green)    Protraction Limitations  3x10 reps    External Rotation  Strengthening;Left;20 reps;Theraband;Limitations    Theraband Level (Shoulder External Rotation)  Level 3 (Green)    External Rotation Limitations  x20 reps    Internal Rotation  Strengthening;Left;20 reps;Theraband;Limitations    Theraband Level (Shoulder Internal Rotation)  Level  3 (Green)    Internal Rotation Limitations   x20 reps    Extension  Strengthening;Left;20 reps;Theraband;Limitations    Theraband Level (Shoulder Extension)  Level 3 (Green)    Row  Strengthening;Left;20 reps;Theraband;Limitations    Theraband Level (Shoulder Row)  Level 3 (Green)      Shoulder Exercises: ROM/Strengthening   UBE (Upper Arm Bike)  90 RPM's 8 minutes alternating forward and backward motions      Modalities   Modalities  Electrical Stimulation;Moist Heat      Moist Heat Therapy   Number Minutes Moist Heat  15 Minutes    Moist Heat Location  Shoulder      Electrical Stimulation   Electrical Stimulation Location  levator, deltoid    Electrical Stimulation Action  pre-modulated    Electrical Stimulation Parameters  80-150 Hz x 15 minutes    Electrical Stimulation Goals  Pain      Manual Therapy   Manual Therapy  Passive ROM    Passive ROM  PROM of L shoulder into flexion, ER, IR              PT Education - 09/01/18 1206    Education Details  Edu pt on extending PT POC and self mobilization using a  tennisball/therapy ball.     Person(s) Educated  Patient    Methods  Explanation;Demonstration    Comprehension  Returned demonstration;Verbalized understanding       PT Short Term Goals - 09/01/18 1113      PT SHORT TERM GOAL #1   Title  STG's = LTG's        PT Long Term Goals - 09/01/18 1113      PT LONG TERM GOAL #1   Title  Pt will be independent in her HEP and progression.     Baseline  Met 4/5.    Time  6    Period  Weeks    Status  Achieved      PT LONG TERM GOAL #2   Title  Pt will be able to improve left shoulder flexion to >/= 150 degrees in order to improve functional mobility.     Baseline  152 PROM on 09/01/18    Time  6    Period  Weeks    Status  Achieved      PT LONG TERM GOAL #3   Title  Pt will be able to increase her left shoulder IR to >/= T10 level in order to improve funcitonal mobility.     Baseline  Pt currently to T12 with her left thumb.     Time  4   additional weeks   Period  Weeks    Status  On-going    Target Date  09/29/18      PT LONG TERM GOAL #4   Title  Pt will be able to lift a bag of groceries with pain </= 2/10.     Baseline  pt reporting able to met this goal.     Time  6    Status  Achieved      PT LONG TERM GOAL #5   Title  Pt will improve her left grip strength to >/= 40ppsi.     Baseline  ppsi on R: 58, L: 55 on 09/01/18    Time  6    Status  Achieved      Additional Long Term Goals   Additional Long Term Goals  Yes      PT LONG  TERM GOAL #6   Title  Pt will improve her active left ER to >/= 75 degrees in order to fix her hair and for ADL's.     Time  4    Period  Weeks    Status  New    Target Date  09/29/18            Plan - 09/01/18 1214    Clinical Impression Statement  Pt has made excellent progress with ROM and strength. I discussed DN with pt and feel that since there is no pain at rest and pt is continually making progress with ROM and strength that continuing her current POC is the best option. Pt  was issued a green theraband today to progress her strengthening at home.  Still progressing toward her IR goals of fastening and unfasting her bra. Also a new ER goal was added to improve left ER to >/= 70 degrees actively. Pt reporting no pain at rest and tenderness over levator, deltoids, and infraspinatus. Pt could benefit from 4 more visits of skilled therapy for joint mobilization and to help improve pt's AROM of her left shoulder to meet her goals set.     Rehab Potential  Excellent    PT Frequency  1x / week    PT Duration  4 weeks    PT Treatment/Interventions  ADLs/Self Care Home Management;Electrical Stimulation;Iontophoresis 15m/ml Dexamethasone;Moist Heat;Ultrasound;Functional mobility training;Therapeutic activities;Therapeutic exercise;Manual techniques;Patient/family education;Passive range of motion;Dry needling;Taping;Cryotherapy    PT Next Visit Plan  Shoulder ROM, gentle strengthening, modalities as needed    PT Home Exercise Plan  shoulder flexion with cane, ER with cane in supine, 4 way shouder isometrics, extension with towel, and IR with towel     Consulted and Agree with Plan of Care  Patient       Patient will benefit from skilled therapeutic intervention in order to improve the following deficits and impairments:  Pain, Postural dysfunction, Decreased range of motion, Decreased strength, Impaired UE functional use  Visit Diagnosis: Acute pain of left shoulder  Muscle weakness (generalized)     Problem List Patient Active Problem List   Diagnosis Date Noted  . Diverticulosis 06/29/2017  . Bipolar disorder ([place] 01/26/2016  . Depression 05/09/2014  . Narcolepsy 05/09/2014  . Migraines 04/19/2013  . Fibromyalgia muscle pain 03/07/2013  . PCOS (polycystic ovarian syndrome) 03/07/2013    JOretha Caprice MPT 09/01/2018, 12:28 PM  [facility] [address] [address]Phone: [phone]  Fax:   [phone] Name: AARDRA KUZNICKIMRN: [id number] of Birth: [birth year]

## 2018-09-06 ENCOUNTER — Ambulatory Visit: Payer: BLUE CROSS/BLUE SHIELD | Admitting: Physical Therapy

## 2018-09-06 DIAGNOSIS — M25512 Pain in left shoulder: Secondary | ICD-10-CM

## 2018-09-06 DIAGNOSIS — M6281 Muscle weakness (generalized): Secondary | ICD-10-CM

## 2018-09-06 NOTE — Therapy (Signed)
[facility] [place], [place], [address] Phone: [phone]   Fax:  [phone]  Physical Therapy Treatment  Patient Details  Name: Rachael Allen MRN: [id number] Date of Birth: 1971/02/20 Referring Provider: Frederik Pear, MD   Encounter Date: 09/06/2018  PT End of Session - 09/06/18 1125    Visit Number  14    Number of Visits  13    Authorization Type  BCBS    PT Start Time  0962    PT Stop Time  1203    PT Time Calculation (min)  47 min    Activity Tolerance  Patient tolerated treatment well    Behavior During Therapy  [facility] for tasks assessed/performed       Past Medical History:  Diagnosis Date  . Bipolar disorder ([place])   . Depression     Past Surgical History:  Procedure Laterality Date  . CESAREAN SECTION    . TONSILLECTOMY AND ADENOIDECTOMY      There were no vitals filed for this visit.  Subjective Assessment - 09/06/18 1207    Subjective  Reports no current pain or difficulty at home.    Pertinent History  C-section, L knee surgery, L and R foot surgery (ganglion cysts)    Limitations  House hold activities    Patient Stated Goals  stop hurting,  I would like to improve my range of motion.     Currently in Pain?  No/denies         [facility] PT Assessment - 09/06/18 0001      Assessment   Medical Diagnosis  L shoulder pain    Onset Date/Surgical Date  03/18/18    Hand Dominance  Right    Prior Therapy  no      Precautions   Precautions  None      Restrictions   Weight Bearing Restrictions  No      ROM / Strength   AROM / PROM / Strength  AROM;PROM      AROM   Overall AROM   Deficits    AROM Assessment Site  Shoulder    Right/Left Shoulder  Left    Left Shoulder Internal Rotation  70 Degrees    Left Shoulder External Rotation  63 Degrees      PROM   Overall PROM   Deficits    PROM Assessment Site  Shoulder    Right/Left Shoulder  Left    Left Shoulder External Rotation  65 Degrees                    [facility] Adult PT Treatment/Exercise - 09/06/18 0001      Shoulder Exercises: Supine   External Rotation  AAROM;Both;20 reps   90/90     Shoulder Exercises: Standing   Protraction  Strengthening;Left;20 reps;Theraband;Limitations    Theraband Level (Shoulder Protraction)  Level 2 (Red)    External Rotation  Strengthening;Left;20 reps;Theraband;Limitations    Theraband Level (Shoulder External Rotation)  Level 2 (Red)    Internal Rotation  Strengthening;Left;20 reps;Theraband;Limitations    Theraband Level (Shoulder Internal Rotation)  Level 2 (Red)    Extension  Strengthening;Left;20 reps;Theraband;Limitations    Theraband Level (Shoulder Extension)  Level 2 (Red)    Row  Strengthening;Left;20 reps;Theraband;Limitations    Theraband Level (Shoulder Row)  Level 2 (Red)    Other Standing Exercises   AAROM ext, IR x20 reps each      Shoulder Exercises: ROM/Strengthening   UBE (Upper Arm  Bike)  60 RPM's 8 minutes alternating forward and backward motions      Shoulder Exercises: Stretch   Internal Rotation Stretch  5 reps   10 sec holds with more adduction for ROM     Modalities   Modalities  Electrical Stimulation;Moist Heat      Moist Heat Therapy   Number Minutes Moist Heat  15 Minutes    Moist Heat Location  Shoulder      Electrical Stimulation   Electrical Stimulation Location  L supraspinatus, deltoids    Electrical Stimulation Action  Pre-Mod    Electrical Stimulation Parameters  80-150 hz x15 min    Electrical Stimulation Goals  Pain      Manual Therapy   Manual Therapy  Joint mobilization;Passive ROM    Joint Mobilization  Grade II-III A/P/I glenohumeral joint mobs followed by PROM    Passive ROM  PROM of L shoulder into flexion, ER, IR                PT Short Term Goals - 09/01/18 1113      PT SHORT TERM GOAL #1   Title  STG's = LTG's        PT Long Term Goals - 09/01/18 1113      PT LONG TERM GOAL #1   Title  Pt will  be independent in her HEP and progression.     Baseline  Met 4/5.    Time  6    Period  Weeks    Status  Achieved      PT LONG TERM GOAL #2   Title  Pt will be able to improve left shoulder flexion to >/= 150 degrees in order to improve functional mobility.     Baseline  152 PROM on 09/01/18    Time  6    Period  Weeks    Status  Achieved      PT LONG TERM GOAL #3   Title  Pt will be able to increase her left shoulder IR to >/= T10 level in order to improve funcitonal mobility.     Baseline  Pt currently to T12 with her left thumb.     Time  4   additional weeks   Period  Weeks    Status  On-going    Target Date  09/29/18      PT LONG TERM GOAL #4   Title  Pt will be able to lift a bag of groceries with pain </= 2/10.     Baseline  pt reporting able to met this goal.     Time  6    Status  Achieved      PT LONG TERM GOAL #5   Title  Pt will improve her left grip strength to >/= 40ppsi.     Baseline  ppsi on R: 58, L: 55 on 09/01/18    Time  6    Status  Achieved      Additional Long Term Goals   Additional Long Term Goals  Yes      PT LONG TERM GOAL #6   Title  Pt will improve her active left ER to >/= 75 degrees in order to fix her hair and for ADL's.     Time  4    Period  Weeks    Status  New    Target Date  09/29/18            Plan - 09/06/18 1203  Clinical Impression Statement  Patient tolerated today's treatment well with reports of no current pain upon arrival. Patient had no complaints during any aspect of therex session verbalized. Patient demonstrated improved L glenohumeral joint mobility in anterior and posterior directions. AROM L shoulder ER measured as 63 deg today following joint moblizations. Firm end feels and smooth arc of motion noted with PROM of L shoulder but patient reported 4-5/10 L shoulder pain with ER. Normal modalities response noted following removal of the modalities.    Rehab Potential  Excellent    PT Frequency  1x / week    PT  Duration  4 weeks    PT Treatment/Interventions  ADLs/Self Care Home Management;Electrical Stimulation;Iontophoresis 57m/ml Dexamethasone;Moist Heat;Ultrasound;Functional mobility training;Therapeutic activities;Therapeutic exercise;Manual techniques;Patient/family education;Passive range of motion;Dry needling;Taping;Cryotherapy    PT Next Visit Plan  Shoulder ROM, gentle strengthening, modalities as needed    PT Home Exercise Plan  shoulder flexion with cane, ER with cane in supine, 4 way shouder isometrics, extension with towel, and IR with towel     Consulted and Agree with Plan of Care  Patient       Patient will benefit from skilled therapeutic intervention in order to improve the following deficits and impairments:  Pain, Postural dysfunction, Decreased range of motion, Decreased strength, Impaired UE functional use  Visit Diagnosis: Acute pain of left shoulder  Muscle weakness (generalized)     Problem List Patient Active Problem List   Diagnosis Date Noted  . Diverticulosis 06/29/2017  . Bipolar disorder ([place] 01/26/2016  . Depression 05/09/2014  . Narcolepsy 05/09/2014  . Migraines 04/19/2013  . Fibromyalgia muscle pain 03/07/2013  . PCOS (polycystic ovarian syndrome) 03/07/2013    KStandley Brooking PTA 09/06/2018, 12:08 PM  [facility] [address] [address]Phone: [phone]  Fax:  [phone] Name: Rachael PALMISANOMRN: [id number] of Birth: 106-30-1972

## 2018-09-14 ENCOUNTER — Ambulatory Visit: Payer: BLUE CROSS/BLUE SHIELD | Attending: Orthopedic Surgery | Admitting: Physical Therapy

## 2018-09-14 ENCOUNTER — Encounter: Payer: Self-pay | Admitting: Physical Therapy

## 2018-09-14 DIAGNOSIS — M6281 Muscle weakness (generalized): Secondary | ICD-10-CM | POA: Diagnosis not present

## 2018-09-14 DIAGNOSIS — M25512 Pain in left shoulder: Secondary | ICD-10-CM

## 2018-09-14 NOTE — Therapy (Signed)
[facility] [place], [place], [address] Phone: [phone]   Fax:  [phone]  Physical Therapy Treatment  Patient Details  Name: Rachael Allen MRN: [id number] Date of Birth: 1971-07-23 Referring Provider (PT): Frederik Pear, MD   Encounter Date: 09/14/2018  PT End of Session - 09/14/18 1124    Visit Number  15    Number of Visits  17    Date for PT Re-Evaluation  09/29/18    Authorization Type  BCBS    PT Start Time  1116    PT Stop Time  1202    PT Time Calculation (min)  46 min    Activity Tolerance  Patient tolerated treatment well    Behavior During Therapy  [facility] for tasks assessed/performed       Past Medical History:  Diagnosis Date  . Bipolar disorder ([place])   . Depression     Past Surgical History:  Procedure Laterality Date  . CESAREAN SECTION    . TONSILLECTOMY AND ADENOIDECTOMY      There were no vitals filed for this visit.  Subjective Assessment - 09/14/18 1123    Subjective  Patient reported no pain and overall improvement    Pertinent History  C-section, L knee surgery, L and R foot surgery (ganglion cysts)    Limitations  House hold activities    Patient Stated Goals  stop hurting,  I would like to improve my range of motion.     Currently in Pain?  No/denies                       [facility] Adult PT Treatment/Exercise - 09/14/18 0001      Exercises   Exercises  Shoulder      Shoulder Exercises: Standing   Protraction  Strengthening;Left;20 reps;Theraband;Limitations    Theraband Level (Shoulder Protraction)  Level 3 (Green)    External Rotation  Strengthening;Left;20 reps;Theraband;Limitations    Theraband Level (Shoulder External Rotation)  Level 3 (Green)    Internal Rotation  Strengthening;Left;20 reps;Theraband;Limitations    Theraband Level (Shoulder Internal Rotation)  Level 3 (Green)    Extension  Strengthening;Left;20 reps;Theraband;Limitations    Theraband Level  (Shoulder Extension)  Level 3 (Green)    Row  Strengthening;Left;20 reps;Theraband;Limitations    Theraband Level (Shoulder Row)  Level 3 (Green)      Shoulder Exercises: ROM/Strengthening   UBE (Upper Arm Bike)  60 RPM's 8 minutes alternating forward and backward motions    Pushups  20 reps      Shoulder Exercises: Stretch   Corner Stretch  3 reps;20 seconds    Internal Rotation Stretch  10 seconds   with towel     Moist Heat Therapy   Number Minutes Moist Heat  15 Minutes    Moist Heat Location  Shoulder      Electrical Stimulation   Electrical Stimulation Location  L supraspinatus, deltoids    Electrical Stimulation Action  premod    Electrical Stimulation Parameters  80-150hz x18mn    Electrical Stimulation Goals  Pain      Manual Therapy   Manual Therapy  Passive ROM;Joint mobilization    Joint Mobilization  Grade II-III A/P/I glenohumeral joint mobs     Passive ROM  PROM of L shoulder into ER and IR                PT Short Term Goals - 09/01/18 1113      PT SHORT TERM  GOAL #1   Title  STG's = LTG's        PT Long Term Goals - 09/14/18 1156      PT LONG TERM GOAL #1   Title  Pt will be independent in her HEP and progression.     Baseline  Met 4/5.    Time  6    Period  Weeks    Status  Achieved      PT LONG TERM GOAL #2   Title  Pt will be able to improve left shoulder flexion to >/= 150 degrees in order to improve functional mobility.     Baseline  152 PROM on 09/01/18    Time  6    Period  Weeks    Status  Achieved      PT LONG TERM GOAL #3   Title  Pt will be able to increase her left shoulder IR to >/= T10 level in order to improve funcitonal mobility.     Baseline  Pt currently to T12 with her left thumb.     Time  4    Period  Weeks    Status  On-going      PT LONG TERM GOAL #4   Title  Pt will be able to lift a bag of groceries with pain </= 2/10.     Baseline  pt reporting able to met this goal.     Time  6    Period  Weeks     Status  Achieved      PT LONG TERM GOAL #5   Title  Pt will improve her left grip strength to >/= 40ppsi.     Baseline  ppsi on R: 58, L: 55 on 09/01/18    Time  6    Period  Weeks    Status  Achieved      PT LONG TERM GOAL #6   Time  4    Period  Weeks    Status  On-going            Plan - 09/14/18 1154    Clinical Impression Statement  Patient tolerated treatment well today. Patient reported no pain pre or post treatment, yet discomfort with IR or ER stretching that radiated down arm a little. Patient has ongoing tightness with IR and ER movements at this tme. Goals ongoing.     Rehab Potential  Excellent    PT Frequency  1x / week    PT Duration  4 weeks    PT Treatment/Interventions  ADLs/Self Care Home Management;Electrical Stimulation;Iontophoresis 75m/ml Dexamethasone;Moist Heat;Ultrasound;Functional mobility training;Therapeutic activities;Therapeutic exercise;Manual techniques;Patient/family education;Passive range of motion;Dry needling;Taping;Cryotherapy    PT Next Visit Plan  cont with POC for Shoulder ROM, gentle strengthening, modalities as needed    Consulted and Agree with Plan of Care  Patient       Patient will benefit from skilled therapeutic intervention in order to improve the following deficits and impairments:  Pain, Postural dysfunction, Decreased range of motion, Decreased strength, Impaired UE functional use  Visit Diagnosis: Acute pain of left shoulder  Muscle weakness (generalized)     Problem List Patient Active Problem List   Diagnosis Date Noted  . Diverticulosis 06/29/2017  . Bipolar disorder ([place] 01/26/2016  . Depression 05/09/2014  . Narcolepsy 05/09/2014  . Migraines 04/19/2013  . Fibromyalgia muscle pain 03/07/2013  . PCOS (polycystic ovarian syndrome) 03/07/2013    [employer]y Rothschild P, PTA 09/14/2018, 12:06 PM  [facility]  [place], [place], [address] Phone:  [phone]   Fax:  [phone]  Name: Rachael Allen MRN: [id number] Date of Birth: March 11, 1971

## 2018-09-21 ENCOUNTER — Encounter: Payer: Self-pay | Admitting: Physical Therapy

## 2018-09-21 ENCOUNTER — Ambulatory Visit: Payer: BLUE CROSS/BLUE SHIELD | Admitting: Physical Therapy

## 2018-09-21 DIAGNOSIS — M6281 Muscle weakness (generalized): Secondary | ICD-10-CM | POA: Diagnosis not present

## 2018-09-21 DIAGNOSIS — M25512 Pain in left shoulder: Secondary | ICD-10-CM

## 2018-09-21 NOTE — Therapy (Signed)
[facility] [place], [place], [address] Phone: [phone]   Fax:  [phone]  Physical Therapy Treatment  Patient Details  Name: Rachael Allen MRN: [id number] Date of Birth: 1971-04-29 Referring Provider (PT): Frederik Pear, MD   Encounter Date: 09/21/2018  PT End of Session - 09/21/18 1119    Visit Number  16    Number of Visits  17    Date for PT Re-Evaluation  09/29/18    Authorization Type  BCBS    PT Start Time  1119    PT Stop Time  1208    PT Time Calculation (min)  49 min    Activity Tolerance  Patient tolerated treatment well    Behavior During Therapy  [facility] for tasks assessed/performed       Past Medical History:  Diagnosis Date  . Bipolar disorder ([place])   . Depression     Past Surgical History:  Procedure Laterality Date  . CESAREAN SECTION    . TONSILLECTOMY AND ADENOIDECTOMY      There were no vitals filed for this visit.  Subjective Assessment - 09/21/18 1118    Subjective  Reports no pain. Still limited with IR although better.    Pertinent History  C-section, L knee surgery, L and R foot surgery (ganglion cysts)    Limitations  House hold activities    Patient Stated Goals  stop hurting,  I would like to improve my range of motion.     Currently in Pain?  No/denies         [facility] PT Assessment - 09/21/18 0001      Assessment   Medical Diagnosis  L shoulder pain    Onset Date/Surgical Date  03/18/18    Hand Dominance  Right    Prior Therapy  no      Precautions   Precautions  None      Restrictions   Weight Bearing Restrictions  No                   [facility] Adult PT Treatment/Exercise - 09/21/18 0001      Shoulder Exercises: Standing   Protraction  Strengthening;Left;Theraband;Limitations    Theraband Level (Shoulder Protraction)  Level 3 (Green)    Protraction Limitations  3x10 reps    External Rotation  Strengthening;Left;Theraband;Limitations    Theraband  Level (Shoulder External Rotation)  Level 3 (Green)    External Rotation Limitations  3x10 reps    Internal Rotation  Strengthening;Left;Theraband;Limitations    Theraband Level (Shoulder Internal Rotation)  Level 3 (Green)    Internal Rotation Limitations  3x10 reps    Flexion  Strengthening;Left;Theraband    Theraband Level (Shoulder Flexion)  Level 3 (Green)    Shoulder Flexion Weight (lbs)  3x10 reps    Extension  Strengthening;Left;Theraband;Limitations    Theraband Level (Shoulder Extension)  Level 3 (Green)    Extension Limitations  3x10 reps; AAROM x20 reps    Row  Strengthening;Left;Theraband;Limitations    Theraband Level (Shoulder Row)  Level 3 (Green)    Row Limitations  3x10 reps      Shoulder Exercises: IT sales professional  5 reps;20 seconds    Internal Rotation Stretch  5 reps   10 sec each with towel     Modalities   Modalities  Electrical Stimulation;Moist Heat      Moist Heat Therapy   Number Minutes Moist Heat  15 Minutes    Moist Heat Location  Shoulder  Acupuncturist Location  L shoulder    Electrical Stimulation Action  Pre-Mod    Electrical Stimulation Parameters  80-150 hz x15 min    Electrical Stimulation Goals  Pain      Manual Therapy   Manual Therapy  Passive ROM;Joint mobilization    Joint Mobilization  Grade II-III A/P/I glenohumeral joint mobs     Passive ROM  PROM of L shoulder into flex, ER, IR with holds at end range               PT Short Term Goals - 09/01/18 1113      PT SHORT TERM GOAL #1   Title  STG's = LTG's        PT Long Term Goals - 09/14/18 1156      PT LONG TERM GOAL #1   Title  Pt will be independent in her HEP and progression.     Baseline  Met 4/5.    Time  6    Period  Weeks    Status  Achieved      PT LONG TERM GOAL #2   Title  Pt will be able to improve left shoulder flexion to >/= 150 degrees in order to improve functional mobility.     Baseline  152 PROM  on 09/01/18    Time  6    Period  Weeks    Status  Achieved      PT LONG TERM GOAL #3   Title  Pt will be able to increase her left shoulder IR to >/= T10 level in order to improve funcitonal mobility.     Baseline  Pt currently to T12 with her left thumb.     Time  4    Period  Weeks    Status  On-going      PT LONG TERM GOAL #4   Title  Pt will be able to lift a bag of groceries with pain </= 2/10.     Baseline  pt reporting able to met this goal.     Time  6    Period  Weeks    Status  Achieved      PT LONG TERM GOAL #5   Title  Pt will improve her left grip strength to >/= 40ppsi.     Baseline  ppsi on R: 58, L: 55 on 09/01/18    Time  6    Period  Weeks    Status  Achieved      PT LONG TERM GOAL #6   Time  4    Period  Weeks    Status  On-going            Plan - 09/21/18 1215    Clinical Impression Statement  Patient tolerated today's treatment well although she continues to experience pain with L shoulder IR. Increased holds at end range completed and more stretches completed to improve joint mobility. 4-5/10 L shoulder pain reported followed by therex session. Improved joint mobility in anterior direction in grade II-III following by PROM in each direction. Normal modalities response noted following removal of the modalities.    Rehab Potential  Excellent    PT Frequency  1x / week    PT Duration  4 weeks    PT Treatment/Interventions  ADLs/Self Care Home Management;Electrical Stimulation;Iontophoresis 73m/ml Dexamethasone;Moist Heat;Ultrasound;Functional mobility training;Therapeutic activities;Therapeutic exercise;Manual techniques;Patient/family education;Passive range of motion;Dry needling;Taping;Cryotherapy    PT Next Visit Plan  cont with POC  for Shoulder ROM, gentle strengthening, modalities as needed    PT Home Exercise Plan  shoulder flexion with cane, ER with cane in supine, 4 way shouder isometrics, extension with towel, and IR with towel     Consulted  and Agree with Plan of Care  Patient       Patient will benefit from skilled therapeutic intervention in order to improve the following deficits and impairments:  Pain, Postural dysfunction, Decreased range of motion, Decreased strength, Impaired UE functional use  Visit Diagnosis: Acute pain of left shoulder  Muscle weakness (generalized)     Problem List Patient Active Problem List   Diagnosis Date Noted  . Diverticulosis 06/29/2017  . Bipolar disorder ([place]) 01/26/2016  . Depression 05/09/2014  . Narcolepsy 05/09/2014  . Migraines 04/19/2013  . Fibromyalgia muscle pain 03/07/2013  . PCOS (polycystic ovarian syndrome) 03/07/2013    Standley Brooking, PTA 09/21/2018, 12:22 PM  [facility] [address] [place], [place], [address] Phone: [phone]   Fax:  [phone]  Name: Rachael Allen MRN: [id number] Date of Birth: 07/19/1971

## 2018-09-29 ENCOUNTER — Ambulatory Visit: Payer: BLUE CROSS/BLUE SHIELD | Admitting: *Deleted

## 2018-09-29 DIAGNOSIS — M25512 Pain in left shoulder: Secondary | ICD-10-CM

## 2018-09-29 DIAGNOSIS — M6281 Muscle weakness (generalized): Secondary | ICD-10-CM | POA: Diagnosis not present

## 2018-09-29 NOTE — Therapy (Signed)
[facility] [place], [place], [address] Phone: [phone]   Fax:  [phone]  Physical Therapy Treatment  Patient Details  Name: Rachael Allen MRN: [id number] Date of Birth: 23-Apr-1971 Referring Provider (PT): Frederik Pear, MD   Encounter Date: 09/29/2018  PT End of Session - 09/29/18 1215    Visit Number  17    Number of Visits  17    Date for PT Re-Evaluation  09/29/18    Authorization Type  BCBS    PT Start Time  1115    PT Stop Time  1202    PT Time Calculation (min)  47 min       Past Medical History:  Diagnosis Date  . Bipolar disorder ([place])   . Depression     Past Surgical History:  Procedure Laterality Date  . CESAREAN SECTION    . TONSILLECTOMY AND ADENOIDECTOMY      There were no vitals filed for this visit.  Subjective Assessment - 09/29/18 1122    Subjective  DC today. No pain    Pertinent History  C-section, L knee surgery, L and R foot surgery (ganglion cysts)    Limitations  House hold activities    Patient Stated Goals  stop hurting,  I would like to improve my range of motion.     Currently in Pain?  No/denies    Pain Orientation  Left                       [facility] Adult PT Treatment/Exercise - 09/29/18 0001      Shoulder Exercises: Standing   Protraction  Strengthening;Left;Theraband;Limitations    Theraband Level (Shoulder Protraction)  Level 3 (Green)    Protraction Limitations  3x10 reps    External Rotation  Strengthening;Left;Theraband;Limitations    Theraband Level (Shoulder External Rotation)  Level 2 (Red)    External Rotation Limitations  3x10 reps    Internal Rotation  Strengthening;Left;Theraband;Limitations    Theraband Level (Shoulder Internal Rotation)  Level 3 (Green)    Internal Rotation Limitations  3x10 reps    Flexion  Strengthening;Left;Weights;20 reps   3#   Theraband Level (Shoulder Flexion)  --    Shoulder Flexion Weight (lbs)  --    Extension  Strengthening;Left;Theraband;Limitations    Theraband Level (Shoulder Extension)  Level 3 (Green)    Extension Limitations  3x10 reps; AAROM x20 reps    Row  Strengthening;Left;Theraband;Limitations    Theraband Level (Shoulder Row)  Level 3 (Green)    Row Limitations  3x10 reps    Other Standing Exercises  Elbow flexion 3# x 30      Shoulder Exercises: ROM/Strengthening   UBE (Upper Arm Bike)  60 RPM's 8 minutes alternating forward and backward motions    Pushups  20 reps      Shoulder Exercises: Stretch   Corner Stretch  5 reps;20 seconds    Internal Rotation Stretch  5 reps   10 sec each with towel     Modalities   Modalities  --      Manual Therapy   Manual Therapy  Passive ROM;Joint mobilization    Joint Mobilization  Grade II-III A/P/I glenohumeral joint mobs     Passive ROM  PROM of L shoulder into flex, ER, IR with holds at end range               PT Short Term Goals - 09/01/18 1113      PT  SHORT TERM GOAL #1   Title  STG's = LTG's        PT Long Term Goals - 09/29/18 1124      PT LONG TERM GOAL #1   Title  Pt will be independent in her HEP and progression.     Baseline  Met 4/5.    Time  6    Period  Weeks    Status  Achieved      PT LONG TERM GOAL #2   Title  Pt will be able to improve left shoulder flexion to >/= 150 degrees in order to improve functional mobility.     Baseline  150 AROM on 09/01/18    Time  6    Period  Weeks    Status  Achieved      PT LONG TERM GOAL #3   Title  Pt will be able to increase her left shoulder IR to >/= T10 level in order to improve funcitonal mobility.     Baseline  Pt currently to T12 with her left thumb.     Time  4    Period  Weeks    Status  Not Met   NM L5     PT LONG TERM GOAL #4   Title  Pt will be able to lift a bag of groceries with pain </= 2/10.     Baseline  pt reporting able to met this goal.     Time  6    Period  Weeks    Status  Achieved      PT LONG TERM GOAL #5   Title   Pt will improve her left grip strength to >/= 40ppsi.     Baseline  ppsi on R: 58, L: 55 on 09/01/18    Period  Weeks    Status  Achieved      PT LONG TERM GOAL #6   Title  Pt will improve her active left ER to >/= 75 degrees in order to fix her hair and for ADL's.     Time  4    Period  Weeks    Status  Achieved            Plan - 09/29/18 1218    Clinical Impression Statement  Pt arrived today still doing well with no pain LT shldr. She was instructed in strngthening and ROM exs and did well. ALL LTGs have been met except for IR HBB only to L5 area. DC to HEP    Clinical Presentation  Stable    Clinical Decision Making  Low    Rehab Potential  Excellent    PT Frequency  1x / week    PT Duration  4 weeks    PT Treatment/Interventions  ADLs/Self Care Home Management;Electrical Stimulation;Iontophoresis '4mg'$ /ml Dexamethasone;Moist Heat;Ultrasound;Functional mobility training;Therapeutic activities;Therapeutic exercise;Manual techniques;Patient/family education;Passive range of motion;Dry needling;Taping;Cryotherapy    PT Next Visit Plan  DC to HEP    Consulted and Agree with Plan of Care  Patient       Patient will benefit from skilled therapeutic intervention in order to improve the following deficits and impairments:  Pain, Postural dysfunction, Decreased range of motion, Decreased strength, Impaired UE functional use  Visit Diagnosis: Acute pain of left shoulder  Muscle weakness (generalized)     Problem List Patient Active Problem List   Diagnosis Date Noted  . Diverticulosis 06/29/2017  . Bipolar disorder ([place]) 01/26/2016  . Depression 05/09/2014  . Narcolepsy 05/09/2014  . Migraines  04/19/2013  . Fibromyalgia muscle pain 03/07/2013  . PCOS (polycystic ovarian syndrome) 03/07/2013    Lashaya Kienitz,CHRIS, PTA 09/29/2018, 12:23 PM  [facility] [address] [place], [place], [address] Phone: [phone]   Fax:   [phone]  Name: KEIERA STRATHMAN MRN: [id number] Date of Birth: 06-Sep-1971  PHYSICAL THERAPY DISCHARGE SUMMARY  Visits from Start of Care: 17.  Current functional level related to goals / functional outcomes: See above.   Remaining deficits: Patient met all but goal #3.   Education / Equipment: HEP. Plan: Patient agrees to discharge.  Patient goals were partially met. Patient is being discharged due to being pleased with the current functional level.  ?????         Mali Applegate MPT

## 2018-10-03 ENCOUNTER — Encounter: Payer: Self-pay | Admitting: Nurse Practitioner

## 2018-10-03 ENCOUNTER — Ambulatory Visit (INDEPENDENT_AMBULATORY_CARE_PROVIDER_SITE_OTHER): Payer: BLUE CROSS/BLUE SHIELD | Admitting: Nurse Practitioner

## 2018-10-03 VITALS — BP 148/102 | HR 86 | Temp 98.8°F | Ht 64.0 in | Wt 284.0 lb

## 2018-10-03 DIAGNOSIS — H9202 Otalgia, left ear: Secondary | ICD-10-CM | POA: Diagnosis not present

## 2018-10-03 DIAGNOSIS — M797 Fibromyalgia: Secondary | ICD-10-CM | POA: Diagnosis not present

## 2018-10-03 MED ORDER — MELOXICAM 15 MG PO TABS: 15.0000 mg | ORAL_TABLET | Freq: Every day | ORAL | 0 refills | Status: DC

## 2018-10-03 NOTE — Progress Notes (Signed)
Subjective:    Patient ID: Rachael Allen, female    DOB: 1971-06-30, 47 y.o.   MRN: [id number]  Otalgia   There is pain in the left ear. This is a new problem. The current episode started in the past 7 days. The problem occurs constantly. The problem has been gradually improving. There has been no fever. The pain is at a severity of 4/10. The pain is mild. Associated symptoms include headaches (with ear pain). Pertinent negatives include no coughing, rhinorrhea or sore throat. She has tried nothing for the symptoms. There is no history of a chronic ear infection, hearing loss or a tympanostomy tube.    Recent flare-ups from fibromyalgia; hips and legs are primary places of pain and pain has been moving to ankles, walking for pain does not help, heating pads help, pain is achy 8/10 constant, OTC Aleve relieves pain somewhat, massage therapy helps.  Review of Systems  Constitutional: Negative.  Negative for chills and fever.  HENT: Positive for ear pain (left). Negative for rhinorrhea and sore throat.   Eyes: Negative.   Respiratory: Negative for cough, shortness of breath and wheezing.   Cardiovascular: Negative.   Gastrointestinal: Negative.   Endocrine: Negative.   Genitourinary: Negative.   Musculoskeletal: Positive for arthralgias.  Skin: Negative.   Allergic/Immunologic: Negative.   Neurological: Positive for headaches (with ear pain).  Hematological: Negative.   Psychiatric/Behavioral: Negative.        Objective:   Physical Exam  Constitutional: She is oriented to person, place, and time. She appears well-developed and well-nourished.  HENT:  Head: Normocephalic and atraumatic.  Right Ear: External ear normal.  Left Ear: External ear normal. No drainage, swelling or tenderness. Tympanic membrane is erythematous. No decreased hearing is noted.  Nose: Nose normal.  Mouth/Throat: Oropharynx is clear and moist. No oropharyngeal exudate.  Eyes: Pupils are equal, round, and  reactive to light. Conjunctivae and EOM are normal.  Neck: Normal range of motion. Neck supple. No thyromegaly present.  Cardiovascular: Normal rate, regular rhythm, normal heart sounds and intact distal pulses.  Pulmonary/Chest: Effort normal and breath sounds normal.  Abdominal: Soft. Bowel sounds are normal.  Musculoskeletal: Normal range of motion.       Right ankle: She exhibits normal range of motion. Tenderness.       Left ankle: She exhibits normal range of motion. Tenderness.  Neurological: She is alert and oriented to person, place, and time. She displays normal reflexes. No cranial nerve deficit.  Skin: Skin is warm and dry.  Psychiatric: She has a normal mood and affect. Her behavior is normal. Judgment and thought content normal.  Nursing note and vitals reviewed.   BP (!) 148/102   Pulse 86   Temp 98.8 F (37.1 C) (Oral)   Ht 5\' 4"  (1.626 m)   Wt 284 lb (128.8 kg)   BMI 48.75 kg/m     Assessment & Plan:  Rachael Allen comes in today with chief complaint of Ear Pain (left) and Wants to discuss fibromyalgia   Diagnosis and orders addressed:  1. Fibromyalgia muscle pain - meloxicam (MOBIC) 15 MG tablet; Take 1 tablet (15 mg total) by mouth daily.  Dispense: 30 tablet; Refill: 0 -continue heat, walking and massage - may have to do ultram at  Next visit if mobic is not helping  2. Left ear pain -OTC Flonase or OTC decongestant   Follow up plan: 1 month to see if Mobic helping and regular follow up  Mary-Margaret Hassell Done, FNP

## 2018-10-03 NOTE — Patient Instructions (Signed)
Myofascial Pain Syndrome and Fibromyalgia Myofascial pain syndrome and fibromyalgia are both pain disorders. This pain may be felt mainly in your muscles.  Myofascial pain syndrome: ? Always has trigger points or tender points in the muscle that will cause pain when pressed. The pain may come and go. ? Usually affects your neck, upper back, and shoulder areas. The pain often radiates into your arms and hands.  Fibromyalgia: ? Has muscle pains and tenderness that come and go. ? Is often associated with fatigue and sleep disturbances. ? Has trigger points. ? Tends to be long-lasting (chronic), but is not life-threatening.  Fibromyalgia and myofascial pain are not the same. However, they often occur together. If you have both conditions, each can make the other worse. Both are common and can cause enough pain and fatigue to make day-to-day activities difficult. What are the causes? The exact causes of fibromyalgia and myofascial pain are not known. People with certain gene types may be more likely to develop fibromyalgia. Some factors can be triggers for both conditions, such as:  Spine disorders.  Arthritis.  Severe injury (trauma) and other physical stressors.  Being under a lot of stress.  A medical illness.  What are the signs or symptoms? Fibromyalgia The main symptom of fibromyalgia is widespread pain and tenderness in your muscles. This can vary over time. Pain is sometimes described as stabbing, shooting, or burning. You may have tingling or numbness, too. You may also have sleep problems and fatigue. You may wake up feeling tired and groggy (fibro fog). Other symptoms may include:  Bowel and bladder problems.  Headaches.  Visual problems.  Problems with odors and noises.  Depression or mood changes.  Painful menstrual periods (dysmenorrhea).  Dry skin or eyes.  Myofascial pain syndrome Symptoms of myofascial pain syndrome include:  Tight, ropy bands of  muscle.  Uncomfortable sensations in muscular areas, such as: ? Aching. ? Cramping. ? Burning. ? Numbness. ? Tingling. ? Muscle weakness.  Trouble moving certain muscles freely (range of motion).  How is this diagnosed? There are no specific tests to diagnose fibromyalgia or myofascial pain syndrome. Both can be hard to diagnose because their symptoms are common in many other conditions. Your health care provider may suspect one or both of these conditions based on your symptoms and medical history. Your health care provider will also do a physical exam. The key to diagnosing fibromyalgia is having pain, fatigue, and other symptoms for more than three months that cannot be explained by another condition. The key to diagnosing myofascial pain syndrome is finding trigger points in muscles that are tender and cause pain elsewhere in your body (referred pain). How is this treated? Treating fibromyalgia and myofascial pain often requires a team of health care providers. This usually starts with your primary provider and a physical therapist. You may also find it helpful to work with alternative health care providers, such as massage therapists or acupuncturists. Treatment for fibromyalgia may include medicines. This may include nonsteroidal anti-inflammatory drugs (NSAIDs), along with other medicines. Treatment for myofascial pain may also include:  NSAIDs.  Cooling and stretching of muscles.  Trigger point injections.  Sound wave (ultrasound) treatments to stimulate muscles.  Follow these instructions at home:  Take medicines only as directed by your health care provider.  Exercise as directed by your health care provider or physical therapist.  Try to avoid stressful situations.  Practice relaxation techniques to control your stress. You may want to try: ? Biofeedback. ? Visual   imagery. ? Hypnosis. ? Muscle relaxation. ? Yoga. ? Meditation.  Talk to your health care provider  about alternative treatments, such as acupuncture or massage treatment.  Maintain a healthy lifestyle. This includes eating a healthy diet and getting enough sleep.  Consider joining a support group.  Do not do activities that stress or strain your muscles. That includes repetitive motions and heavy lifting. Where to find more information:  National Fibromyalgia Association: www.fmaware.org  Arthritis Foundation: www.arthritis.org  American Chronic Pain Association: www.theacpa.org/condition/myofascial-pain Contact a health care provider if:  You have new symptoms.  Your symptoms get worse.  You have side effects from your medicines.  You have trouble sleeping.  Your condition is causing depression or anxiety. This information is not intended to replace advice given to you by your health care provider. Make sure you discuss any questions you have with your health care provider. Document Released: 11/29/2005 Document Revised: 05/06/2016 Document Reviewed: 09/04/2014 Elsevier Interactive Patient Education  2018 [employer]vier Inc.  

## 2018-11-03 ENCOUNTER — Encounter: Payer: Self-pay | Admitting: Nurse Practitioner

## 2018-11-03 ENCOUNTER — Ambulatory Visit (INDEPENDENT_AMBULATORY_CARE_PROVIDER_SITE_OTHER): Payer: BLUE CROSS/BLUE SHIELD | Admitting: Nurse Practitioner

## 2018-11-03 VITALS — BP 147/90 | HR 80 | Temp 98.4°F | Ht 64.0 in | Wt 288.0 lb

## 2018-11-03 DIAGNOSIS — Z0000 Encounter for general adult medical examination without abnormal findings: Secondary | ICD-10-CM

## 2018-11-03 DIAGNOSIS — K5790 Diverticulosis of intestine, part unspecified, without perforation or abscess without bleeding: Secondary | ICD-10-CM

## 2018-11-03 DIAGNOSIS — F3342 Major depressive disorder, recurrent, in full remission: Secondary | ICD-10-CM

## 2018-11-03 DIAGNOSIS — E282 Polycystic ovarian syndrome: Secondary | ICD-10-CM

## 2018-11-03 DIAGNOSIS — M797 Fibromyalgia: Secondary | ICD-10-CM

## 2018-11-03 DIAGNOSIS — R739 Hyperglycemia, unspecified: Secondary | ICD-10-CM | POA: Diagnosis not present

## 2018-11-03 DIAGNOSIS — F3160 Bipolar disorder, current episode mixed, unspecified: Secondary | ICD-10-CM | POA: Diagnosis not present

## 2018-11-03 DIAGNOSIS — G43719 Chronic migraine without aura, intractable, without status migrainosus: Secondary | ICD-10-CM

## 2018-11-03 LAB — URINALYSIS, COMPLETE
Bilirubin, UA: NEGATIVE
GLUCOSE, UA: NEGATIVE
KETONES UA: NEGATIVE
LEUKOCYTES UA: NEGATIVE
Nitrite, UA: NEGATIVE
Protein, UA: NEGATIVE
RBC, UA: NEGATIVE
SPEC GRAV UA: 1.025 (ref 1.005–1.030)
Urobilinogen, Ur: 0.2 mg/dL (ref 0.2–1.0)
pH, UA: 5.5 (ref 5.0–7.5)

## 2018-11-03 LAB — MICROSCOPIC EXAMINATION: Renal Epithel, UA: NONE SEEN /hpf

## 2018-11-03 MED ORDER — METFORMIN HCL ER 500 MG PO TB24: 500.0000 mg | ORAL_TABLET | Freq: Two times a day (BID) | ORAL | 1 refills | Status: DC

## 2018-11-03 MED ORDER — MELOXICAM 15 MG PO TABS: 15.0000 mg | ORAL_TABLET | Freq: Every day | ORAL | 3 refills | Status: DC

## 2018-11-03 NOTE — Addendum Note (Signed)
Addended by: Rolena Infante on: 11/03/2018 12:10 PM   Modules accepted: Orders

## 2018-11-03 NOTE — Progress Notes (Signed)
Subjective:    Patient ID: Rachael Allen, female    DOB: 10-01-1971, 47 y.o.   MRN: [id number]   Chief Complaint: annual physical  HPI:  1. Annual physical exam   2. Intractable chronic migraine without aura and without status migrainosus  denies any recent mgraines  3. Diverticulosis  No recent flare ups. She knows which foods cause her flare ups so she watches those foods.  4. Bipolar affective disorder, current episode mixed, current episode severity unspecified ([place]) patient is on latuda and she says that it has really helped her mood. Has really controlled her mood swings   5. Recurrent major depressive disorder, in full remission ([place])  Again she is on latuda and is doing much better.  Depression screen [facility] 2/9 11/03/2018 10/03/2018 05/04/2018  Decreased Interest 0 0 1  Down, Depressed, Hopeless 1 0 1  PHQ - 2 Score 1 0 2  Altered sleeping - - 1  Tired, decreased energy - - 1  Change in appetite - - 1  Feeling bad or failure about yourself  - - 0  Trouble concentrating - - 0  Moving slowly or fidgety/restless - - 0  Suicidal thoughts - - 0  PHQ-9 Score - - 5  Difficult doing work/chores - - -     6. Fibromyalgia muscle pain  She is currently only on mobic. She hurts everyday but is tolerable  On mobic.    Outpatient Encounter Medications as of 11/03/2018  Medication Sig  . Butalbital-APAP-Caff-Cod (FIORICET/CODEINE) 50-300-40-30 MG CAPS Take 1 capsule by mouth as needed.  . lurasidone (LATUDA) 40 MG TABS tablet Take 40 mg by mouth daily with breakfast.  . meloxicam (MOBIC) 15 MG tablet Take 1 tablet (15 mg total) by mouth daily.  . metFORMIN (GLUCOPHAGE-XR) 500 MG 24 hr tablet Take 1 tablet (500 mg total) by mouth 2 (two) times daily.  . valACYclovir (VALTREX) 1000 MG tablet TAKE 2 TABLETS TWICE A DAY FOR 1 DAY THEN AS NEEDED FOR FEVER BLISTERS     New complaints: None today  Social history: Is on disability for fibromyalgia  Review of Systems    Constitutional: Negative for activity change and appetite change.  HENT: Negative.   Eyes: Negative for pain.  Respiratory: Negative for shortness of breath.   Cardiovascular: Negative for chest pain, palpitations and leg swelling.  Gastrointestinal: Negative for abdominal pain.  Endocrine: Negative for polydipsia.  Genitourinary: Negative.   Skin: Negative for rash.  Neurological: Negative for dizziness, weakness and headaches.  Hematological: Does not bruise/bleed easily.  Psychiatric/Behavioral: Negative.   All other systems reviewed and are negative.      Objective:   Physical Exam  Constitutional: She is oriented to person, place, and time. She appears well-developed and well-nourished. No distress.  HENT:  Head: Normocephalic.  Nose: Nose normal.  Mouth/Throat: Oropharynx is clear and moist.  Eyes: Pupils are equal, round, and reactive to light. EOM are normal.  Neck: Normal range of motion. Neck supple. No JVD present. Carotid bruit is not present.  Cardiovascular: Normal rate, regular rhythm, normal heart sounds and intact distal pulses.  Pulmonary/Chest: Effort normal and breath sounds normal. No respiratory distress. She has no wheezes. She has no rales. She exhibits no tenderness.  Abdominal: Soft. Normal appearance, normal aorta and bowel sounds are normal. She exhibits no distension, no abdominal bruit, no pulsatile midline mass and no mass. There is no splenomegaly or hepatomegaly. There is no tenderness.  Genitourinary: Vagina normal and uterus  normal. No vaginal discharge found.  Genitourinary Comments: No adnexal masses or tenderness  Musculoskeletal: Normal range of motion. She exhibits no edema.  Lymphadenopathy:    She has no cervical adenopathy.  Neurological: She is alert and oriented to person, place, and time. She has normal reflexes.  Skin: Skin is warm and dry.  Psychiatric: She has a normal mood and affect. Her behavior is normal. Judgment and thought  content normal.  Nursing note and vitals reviewed.  BP (!) 147/90   Pulse 80   Temp 98.4 F (36.9 C) (Oral)   Ht '5\' 4"'  (1.626 m)   Wt 288 lb (130.6 kg)   BMI 49.44 kg/m       Assessment & Plan:  Rachael Allen comes in today with chief complaint of Annual Exam   Diagnosis and orders addressed:  1. Annual physical exam - CBC with Differential/Platelet - CMP14+EGFR - Lipid panel - Thyroid Panel With TSH - IGP, Aptima HPV, rfx 16/18,45  2. Intractable chronic migraine without aura and without status migrainosus Avoid caffeine  3. Diverticulosis Watch diet to prevent flare ups  4. Bipolar affective disorder, current episode mixed, current episode severity unspecified ([place]) Continue latuda as rx- Keep followup with psych  5. Recurrent major depressive disorder, in full remission [facility]) Stress management  6. Fibromyalgia muscle pain Exercise to keep muscles warm - meloxicam (MOBIC) 15 MG tablet; Take 1 tablet (15 mg total) by mouth daily.  Dispense: 30 tablet; Refill: 3  7. PCOS (polycystic ovarian syndrome) - metFORMIN (GLUCOPHAGE-XR) 500 MG 24 hr tablet; Take 1 tablet (500 mg total) by mouth 2 (two) times daily.  Dispense: 180 tablet; Refill: 1   Labs pending Health Maintenance reviewed Diet and exercise encouraged  Follow up plan: 6 months   Mary-Margaret Hassell Done, FNP

## 2018-11-03 NOTE — Patient Instructions (Signed)
Myofascial Pain Syndrome and Fibromyalgia Myofascial pain syndrome and fibromyalgia are both pain disorders. This pain may be felt mainly in your muscles.  Myofascial pain syndrome: ? Always has trigger points or tender points in the muscle that will cause pain when pressed. The pain may come and go. ? Usually affects your neck, upper back, and shoulder areas. The pain often radiates into your arms and hands.  Fibromyalgia: ? Has muscle pains and tenderness that come and go. ? Is often associated with fatigue and sleep disturbances. ? Has trigger points. ? Tends to be long-lasting (chronic), but is not life-threatening.  Fibromyalgia and myofascial pain are not the same. However, they often occur together. If you have both conditions, each can make the other worse. Both are common and can cause enough pain and fatigue to make day-to-day activities difficult. What are the causes? The exact causes of fibromyalgia and myofascial pain are not known. People with certain gene types may be more likely to develop fibromyalgia. Some factors can be triggers for both conditions, such as:  Spine disorders.  Arthritis.  Severe injury (trauma) and other physical stressors.  Being under a lot of stress.  A medical illness.  What are the signs or symptoms? Fibromyalgia The main symptom of fibromyalgia is widespread pain and tenderness in your muscles. This can vary over time. Pain is sometimes described as stabbing, shooting, or burning. You may have tingling or numbness, too. You may also have sleep problems and fatigue. You may wake up feeling tired and groggy (fibro fog). Other symptoms may include:  Bowel and bladder problems.  Headaches.  Visual problems.  Problems with odors and noises.  Depression or mood changes.  Painful menstrual periods (dysmenorrhea).  Dry skin or eyes.  Myofascial pain syndrome Symptoms of myofascial pain syndrome include:  Tight, ropy bands of  muscle.  Uncomfortable sensations in muscular areas, such as: ? Aching. ? Cramping. ? Burning. ? Numbness. ? Tingling. ? Muscle weakness.  Trouble moving certain muscles freely (range of motion).  How is this diagnosed? There are no specific tests to diagnose fibromyalgia or myofascial pain syndrome. Both can be hard to diagnose because their symptoms are common in many other conditions. Your health care provider may suspect one or both of these conditions based on your symptoms and medical history. Your health care provider will also do a physical exam. The key to diagnosing fibromyalgia is having pain, fatigue, and other symptoms for more than three months that cannot be explained by another condition. The key to diagnosing myofascial pain syndrome is finding trigger points in muscles that are tender and cause pain elsewhere in your body (referred pain). How is this treated? Treating fibromyalgia and myofascial pain often requires a team of health care providers. This usually starts with your primary provider and a physical therapist. You may also find it helpful to work with alternative health care providers, such as massage therapists or acupuncturists. Treatment for fibromyalgia may include medicines. This may include nonsteroidal anti-inflammatory drugs (NSAIDs), along with other medicines. Treatment for myofascial pain may also include:  NSAIDs.  Cooling and stretching of muscles.  Trigger point injections.  Sound wave (ultrasound) treatments to stimulate muscles.  Follow these instructions at home:  Take medicines only as directed by your health care provider.  Exercise as directed by your health care provider or physical therapist.  Try to avoid stressful situations.  Practice relaxation techniques to control your stress. You may want to try: ? Biofeedback. ? Visual   imagery. ? Hypnosis. ? Muscle relaxation. ? Yoga. ? Meditation.  Talk to your health care provider  about alternative treatments, such as acupuncture or massage treatment.  Maintain a healthy lifestyle. This includes eating a healthy diet and getting enough sleep.  Consider joining a support group.  Do not do activities that stress or strain your muscles. That includes repetitive motions and heavy lifting. Where to find more information:  National Fibromyalgia Association: www.fmaware.org  Arthritis Foundation: www.arthritis.org  American Chronic Pain Association: www.theacpa.org/condition/myofascial-pain Contact a health care provider if:  You have new symptoms.  Your symptoms get worse.  You have side effects from your medicines.  You have trouble sleeping.  Your condition is causing depression or anxiety. This information is not intended to replace advice given to you by your health care provider. Make sure you discuss any questions you have with your health care provider. Document Released: 11/29/2005 Document Revised: 05/06/2016 Document Reviewed: 09/04/2014 Elsevier Interactive Patient Education  2018 [employer]vier Inc.  

## 2018-11-04 LAB — CBC WITH DIFFERENTIAL/PLATELET
Basophils Absolute: 0.1 10*3/uL (ref 0.0–0.2)
Basos: 1 %
EOS (ABSOLUTE): 0.3 10*3/uL (ref 0.0–0.4)
EOS: 4 %
HEMATOCRIT: 38.4 % (ref 34.0–46.6)
HEMOGLOBIN: 13.3 g/dL (ref 11.1–15.9)
IMMATURE GRANS (ABS): 0 10*3/uL (ref 0.0–0.1)
IMMATURE GRANULOCYTES: 1 %
LYMPHS ABS: 1.9 10*3/uL (ref 0.7–3.1)
LYMPHS: 28 %
MCH: 27.7 pg (ref 26.6–33.0)
MCHC: 34.6 g/dL (ref 31.5–35.7)
MCV: 80 fL (ref 79–97)
MONOCYTES: 5 %
Monocytes Absolute: 0.3 10*3/uL (ref 0.1–0.9)
Neutrophils Absolute: 4.2 10*3/uL (ref 1.4–7.0)
Neutrophils: 61 %
Platelets: 309 10*3/uL (ref 150–450)
RBC: 4.8 x10E6/uL (ref 3.77–5.28)
RDW: 13.9 % (ref 12.3–15.4)
WBC: 6.8 10*3/uL (ref 3.4–10.8)

## 2018-11-04 LAB — LIPID PANEL
CHOL/HDL RATIO: 4.4 ratio (ref 0.0–4.4)
Cholesterol, Total: 149 mg/dL (ref 100–199)
HDL: 34 mg/dL — ABNORMAL LOW (ref >39)
LDL CALC: 84 mg/dL (ref 0–99)
Triglycerides: 153 mg/dL — ABNORMAL HIGH (ref 0–149)
VLDL CHOLESTEROL CAL: 31 mg/dL (ref 5–40)

## 2018-11-04 LAB — CMP14+EGFR
ALBUMIN: 4.2 g/dL (ref 3.5–5.5)
ALT: 23 IU/L (ref 0–32)
AST: 18 IU/L (ref 0–40)
Albumin/Globulin Ratio: 1.8 (ref 1.2–2.2)
Alkaline Phosphatase: 59 IU/L (ref 39–117)
BUN / CREAT RATIO: 12 (ref 9–23)
BUN: 10 mg/dL (ref 6–24)
Bilirubin Total: 0.3 mg/dL (ref 0.0–1.2)
CALCIUM: 9.1 mg/dL (ref 8.7–10.2)
CO2: 21 mmol/L (ref 20–29)
CREATININE: 0.86 mg/dL (ref 0.57–1.00)
Chloride: 104 mmol/L (ref 96–106)
GFR, EST AFRICAN AMERICAN: 93 mL/min/{1.73_m2} (ref >59)
GFR, EST NON AFRICAN AMERICAN: 81 mL/min/{1.73_m2} (ref >59)
GLOBULIN, TOTAL: 2.3 g/dL (ref 1.5–4.5)
Glucose: 131 mg/dL — ABNORMAL HIGH (ref 65–99)
Potassium: 4 mmol/L (ref 3.5–5.2)
SODIUM: 142 mmol/L (ref 134–144)
TOTAL PROTEIN: 6.5 g/dL (ref 6.0–8.5)

## 2018-11-04 LAB — THYROID PANEL WITH TSH
FREE THYROXINE INDEX: 1.8 (ref 1.2–4.9)
T3 Uptake Ratio: 22 % — ABNORMAL LOW (ref 24–39)
T4 TOTAL: 8.2 ug/dL (ref 4.5–12.0)
TSH: 1.03 u[IU]/mL (ref 0.450–4.500)

## 2018-11-06 NOTE — Addendum Note (Signed)
Addended by: Chevis Pretty on: 11/06/2018 04:07 PM   Modules accepted: Orders

## 2018-11-07 LAB — IGP, APTIMA HPV, RFX 16/18,45: HPV Aptima: NEGATIVE

## 2018-11-08 LAB — HGB A1C W/O EAG: Hgb A1c MFr Bld: 5.7 % — ABNORMAL HIGH (ref 4.8–5.6)

## 2018-11-08 LAB — SPECIMEN STATUS REPORT

## 2018-11-17 ENCOUNTER — Ambulatory Visit (INDEPENDENT_AMBULATORY_CARE_PROVIDER_SITE_OTHER): Payer: BLUE CROSS/BLUE SHIELD | Admitting: Family Medicine

## 2018-11-17 ENCOUNTER — Encounter: Payer: Self-pay | Admitting: Family Medicine

## 2018-11-17 VITALS — BP 136/84 | HR 65 | Temp 97.0°F | Ht 64.0 in | Wt 286.0 lb

## 2018-11-17 DIAGNOSIS — J029 Acute pharyngitis, unspecified: Secondary | ICD-10-CM

## 2018-11-17 LAB — RAPID STREP SCREEN (MED CTR MEBANE ONLY): STREP GP A AG, IA W/REFLEX: POSITIVE — AB

## 2018-11-17 MED ORDER — AMOXICILLIN 875 MG PO TABS: 875.0000 mg | ORAL_TABLET | Freq: Two times a day (BID) | ORAL | 0 refills | Status: DC

## 2018-11-17 MED ORDER — AMOXICILLIN-POT CLAVULANATE 875-125 MG PO TABS: 1.0000 | ORAL_TABLET | Freq: Two times a day (BID) | ORAL | 0 refills | Status: DC

## 2018-11-17 NOTE — Progress Notes (Signed)
Chief Complaint  Patient presents with  . Sore Throat    HPI  Patient presents today for two days of sore throat.  Subjective fever noted.  No cough or congestion.  No earaches.  No known exposures.  PMH: Smoking status noted ROS: Per HPI  Objective: BP 136/84   Pulse 65   Temp (!) 97 F (36.1 C) (Oral)   Ht 5\' 4"  (1.626 m)   Wt 286 lb (129.7 kg)   BMI 49.09 kg/m  Gen: NAD, alert, cooperative with exam HEENT: NCAT, EOMI, PERRL.  Soft palate petechiae noted with mild erythema only. CV: RRR, good S1/S2, no murmur Resp: CTABL, no wheezes, non-labored  Neuro: Alert and oriented, No gross deficits  Assessment and plan:  1. Sore throat     Meds ordered this encounter  Medications  . DISCONTD: amoxicillin (AMOXIL) 875 MG tablet    Sig: Take 1 tablet (875 mg total) by mouth 2 (two) times daily.    Dispense:  14 tablet    Refill:  0  . amoxicillin-clavulanate (AUGMENTIN) 875-125 MG tablet    Sig: Take 1 tablet by mouth 2 (two) times daily. Take all of this medication    Dispense:  20 tablet    Refill:  0    Replaces the amoxil prescription previously sent. Thanks,WS    Orders Placed This Encounter  Procedures  . Rapid Strep Screen ([facility] ONLY)    Follow up as needed.  Claretta Fraise, MD

## 2018-11-20 ENCOUNTER — Other Ambulatory Visit: Payer: Self-pay | Admitting: Family Medicine

## 2018-11-20 ENCOUNTER — Telehealth: Payer: Self-pay | Admitting: Nurse Practitioner

## 2018-11-20 MED ORDER — PREDNISONE 10 MG PO TABS: ORAL_TABLET | ORAL | 0 refills | Status: DC

## 2018-11-20 NOTE — Telephone Encounter (Signed)
I sent in the requested prescription 

## 2018-11-20 NOTE — Telephone Encounter (Signed)
Patient aware.

## 2018-11-21 ENCOUNTER — Telehealth: Payer: Self-pay | Admitting: Nurse Practitioner

## 2018-11-21 MED ORDER — FLUCONAZOLE 150 MG PO TABS: ORAL_TABLET | ORAL | 0 refills | Status: DC

## 2018-11-21 NOTE — Telephone Encounter (Signed)
Per Dr Livia Snellen ok to send in Agency 150 #2 take 1 now and repeat in 1 week if symptoms persist. Pt aware and rx sent in.

## 2018-11-23 DIAGNOSIS — S233XXA Sprain of ligaments of thoracic spine, initial encounter: Secondary | ICD-10-CM | POA: Diagnosis not present

## 2018-11-23 DIAGNOSIS — S134XXA Sprain of ligaments of cervical spine, initial encounter: Secondary | ICD-10-CM | POA: Diagnosis not present

## 2018-11-23 DIAGNOSIS — S338XXA Sprain of other parts of lumbar spine and pelvis, initial encounter: Secondary | ICD-10-CM | POA: Diagnosis not present

## 2018-11-27 DIAGNOSIS — S233XXA Sprain of ligaments of thoracic spine, initial encounter: Secondary | ICD-10-CM | POA: Diagnosis not present

## 2018-11-27 DIAGNOSIS — S134XXA Sprain of ligaments of cervical spine, initial encounter: Secondary | ICD-10-CM | POA: Diagnosis not present

## 2018-11-27 DIAGNOSIS — S338XXA Sprain of other parts of lumbar spine and pelvis, initial encounter: Secondary | ICD-10-CM | POA: Diagnosis not present

## 2018-12-11 DIAGNOSIS — S338XXA Sprain of other parts of lumbar spine and pelvis, initial encounter: Secondary | ICD-10-CM | POA: Diagnosis not present

## 2018-12-11 DIAGNOSIS — S134XXA Sprain of ligaments of cervical spine, initial encounter: Secondary | ICD-10-CM | POA: Diagnosis not present

## 2018-12-11 DIAGNOSIS — S233XXA Sprain of ligaments of thoracic spine, initial encounter: Secondary | ICD-10-CM | POA: Diagnosis not present

## 2018-12-21 ENCOUNTER — Ambulatory Visit (INDEPENDENT_AMBULATORY_CARE_PROVIDER_SITE_OTHER): Payer: BLUE CROSS/BLUE SHIELD | Admitting: Family

## 2018-12-21 ENCOUNTER — Encounter: Payer: Self-pay | Admitting: Family

## 2018-12-21 VITALS — BP 149/97 | HR 87 | Temp 98.4°F | Ht 64.0 in | Wt 284.8 lb

## 2018-12-21 DIAGNOSIS — L0291 Cutaneous abscess, unspecified: Secondary | ICD-10-CM | POA: Diagnosis not present

## 2018-12-21 NOTE — Progress Notes (Signed)
   Subjective:    Patient ID: Rachael Allen, female    DOB: 12-06-1971, 48 y.o.   MRN: [id number]  Chief Complaint  Patient presents with  . swollen, painful lump in vaginal area                  HPI Pt presents to the office today with a "bump"  In her perineum area that she noticed about a week ago. She states she has tried eposom salt soaks.  States she has only had a bloody discharge.   She reports an intermittent aching pain of 1 out 10. She states the pain has greatly improved over the last 2 days.    Review of Systems  All other systems reviewed and are negative.      Objective:   Physical Exam Vitals signs reviewed.  Constitutional:      General: She is not in acute distress.    Appearance: She is well-developed.  HENT:     Head: Normocephalic and atraumatic.  Eyes:     Pupils: Pupils are equal, round, and reactive to light.  Neck:     Musculoskeletal: Normal range of motion and neck supple.     Thyroid: No thyromegaly.  Cardiovascular:     Rate and Rhythm: Normal rate and regular rhythm.     Heart sounds: Normal heart sounds. No murmur.  Pulmonary:     Effort: Pulmonary effort is normal. No respiratory distress.     Breath sounds: Normal breath sounds. No wheezing.  Abdominal:     General: Bowel sounds are normal. There is no distension.     Palpations: Abdomen is soft.     Tenderness: There is no abdominal tenderness.  Genitourinary:      Comments: Small abscess that is draining scant amount of serous discharge. Nontender  Musculoskeletal: Normal range of motion.        General: No tenderness.  Skin:    General: Skin is warm and dry.  Neurological:     Mental Status: She is alert and oriented to person, place, and time.     Cranial Nerves: No cranial nerve deficit.     Deep Tendon Reflexes: Reflexes are normal and symmetric.  Psychiatric:        Behavior: Behavior normal.        Thought Content: Thought content normal.        Judgment: Judgment  normal.       BP (!) 149/97   Pulse 87   Temp 98.4 F (36.9 C) (Oral)   Ht 5\' 4"  (1.626 m)   Wt 284 lb 12.8 oz (129.2 kg)   BMI 48.89 kg/m      Assessment & Plan:  Rachael Allen comes in today with chief complaint of swollen, painful lump in vaginal area   Diagnosis and orders addressed:  1. Abscess Keep clean and dry Area has already drained and looks well. Will hold off on antibiotics at this time. Warm compresses and continue epsom salt soaks.  RTO as needed  Rachael Dun, FNP

## 2018-12-21 NOTE — Patient Instructions (Signed)
Skin Abscess  A skin abscess is an infected area on or under your skin that contains a collection of pus and other material. An abscess may also be called a furuncle, carbuncle, or boil. An abscess can occur in or on almost any part of your body. Some abscesses break open (rupture) on their own. Most continue to get worse unless they are treated. The infection can spread deeper into the body and eventually into your blood, which can make you feel ill. Treatment usually involves draining the abscess. What are the causes? An abscess occurs when germs, like bacteria, pass through your skin and cause an infection. This may be caused by:  A scrape or cut on your skin.  A puncture wound through your skin, including a needle injection or insect bite.  Blocked oil or sweat glands.  Blocked and infected hair follicles.  A cyst that forms beneath your skin (sebaceous cyst) and becomes infected. What increases the risk? This condition is more likely to develop in people who:  Have a weak body defense system (immune system).  Have diabetes.  Have dry and irritated skin.  Get frequent injections or use illegal IV drugs.  Have a foreign body in a wound, such as a splinter.  Have problems with their lymph system or veins. What are the signs or symptoms? Symptoms of this condition include:  A painful, firm bump under the skin.  A bump with pus at the top. This may break through the skin and drain. Other symptoms include:  Redness surrounding the abscess site.  Warmth.  Swelling of the lymph nodes (glands) near the abscess.  Tenderness.  A sore on the skin. How is this diagnosed? This condition may be diagnosed based on:  A physical exam.  Your medical history.  A sample of pus. This may be used to find out what is causing the infection.  Blood tests.  Imaging tests, such as an ultrasound, CT scan, or MRI. How is this treated? A small abscess that drains on its own may not  need treatment. Treatment for larger abscesses may include:  Moist heat or heat pack applied to the area several times a day.  A procedure to drain the abscess (incision and drainage).  Antibiotic medicines. For a severe abscess, you may first get antibiotics through an IV and then change to antibiotics by mouth. Follow these instructions at home: Medicines   Take over-the-counter and prescription medicines only as told by your health care provider.  If you were prescribed an antibiotic medicine, take it as told by your health care provider. Do not stop taking the antibiotic even if you start to feel better. Abscess care   If you have an abscess that has not drained, apply heat to the affected area. Use the heat source that your health care provider recommends, such as a moist heat pack or a heating pad. ? Place a towel between your skin and the heat source. ? Leave the heat on for 20-30 minutes. ? Remove the heat if your skin turns bright red. This is especially important if you are unable to feel pain, heat, or cold. You may have a greater risk of getting burned.  Follow instructions from your health care provider about how to take care of your abscess. Make sure you: ? Cover the abscess with a bandage (dressing). ? Change your dressing or gauze as told by your health care provider. ? Wash your hands with soap and water before you change the   dressing or gauze. If soap and water are not available, use hand sanitizer.  Check your abscess every day for signs of a worsening infection. Check for: ? More redness, swelling, or pain. ? More fluid or blood. ? Warmth. ? More pus or a bad smell. General instructions  To avoid spreading the infection: ? Do not share personal care items, towels, or hot tubs with others. ? Avoid making skin contact with other people.  Keep all follow-up visits as told by your health care provider. This is important. Contact a health care provider if you  have:  More redness, swelling, or pain around your abscess.  More fluid or blood coming from your abscess.  Warm skin around your abscess.  More pus or a bad smell coming from your abscess.  A fever.  Muscle aches.  Chills or a general ill feeling. Get help right away if you:  Have severe pain.  See red streaks on your skin spreading away from the abscess. Summary  A skin abscess is an infected area on or under your skin that contains a collection of pus and other material.  A small abscess that drains on its own may not need treatment.  Treatment for larger abscesses may include having a procedure to drain the abscess and taking an antibiotic. This information is not intended to replace advice given to you by your health care provider. Make sure you discuss any questions you have with your health care provider. Document Released: 09/08/2005 Document Revised: 01/12/2018 Document Reviewed: 01/12/2018 Elsevier Interactive Patient Education  2019 [employer]  

## 2018-12-26 DIAGNOSIS — S233XXA Sprain of ligaments of thoracic spine, initial encounter: Secondary | ICD-10-CM | POA: Diagnosis not present

## 2018-12-26 DIAGNOSIS — S338XXA Sprain of other parts of lumbar spine and pelvis, initial encounter: Secondary | ICD-10-CM | POA: Diagnosis not present

## 2018-12-26 DIAGNOSIS — S134XXA Sprain of ligaments of cervical spine, initial encounter: Secondary | ICD-10-CM | POA: Diagnosis not present

## 2019-01-05 DIAGNOSIS — F3181 Bipolar II disorder: Secondary | ICD-10-CM | POA: Diagnosis not present

## 2019-01-05 DIAGNOSIS — G4730 Sleep apnea, unspecified: Secondary | ICD-10-CM | POA: Diagnosis not present

## 2019-01-23 DIAGNOSIS — S338XXA Sprain of other parts of lumbar spine and pelvis, initial encounter: Secondary | ICD-10-CM | POA: Diagnosis not present

## 2019-01-23 DIAGNOSIS — S233XXA Sprain of ligaments of thoracic spine, initial encounter: Secondary | ICD-10-CM | POA: Diagnosis not present

## 2019-01-23 DIAGNOSIS — S134XXA Sprain of ligaments of cervical spine, initial encounter: Secondary | ICD-10-CM | POA: Diagnosis not present

## 2019-04-24 DIAGNOSIS — S233XXA Sprain of ligaments of thoracic spine, initial encounter: Secondary | ICD-10-CM | POA: Diagnosis not present

## 2019-04-24 DIAGNOSIS — S338XXA Sprain of other parts of lumbar spine and pelvis, initial encounter: Secondary | ICD-10-CM | POA: Diagnosis not present

## 2019-04-24 DIAGNOSIS — S134XXA Sprain of ligaments of cervical spine, initial encounter: Secondary | ICD-10-CM | POA: Diagnosis not present

## 2019-04-25 ENCOUNTER — Other Ambulatory Visit: Payer: Self-pay | Admitting: *Deleted

## 2019-05-14 ENCOUNTER — Ambulatory Visit: Payer: BLUE CROSS/BLUE SHIELD | Admitting: Nurse Practitioner

## 2019-06-13 ENCOUNTER — Other Ambulatory Visit: Payer: Self-pay

## 2019-06-14 ENCOUNTER — Ambulatory Visit (INDEPENDENT_AMBULATORY_CARE_PROVIDER_SITE_OTHER): Payer: BC Managed Care – PPO | Admitting: Nurse Practitioner

## 2019-06-14 ENCOUNTER — Encounter: Payer: Self-pay | Admitting: Nurse Practitioner

## 2019-06-14 VITALS — BP 136/85 | HR 76 | Temp 98.4°F | Ht 64.0 in | Wt 290.0 lb

## 2019-06-14 DIAGNOSIS — G47411 Narcolepsy with cataplexy: Secondary | ICD-10-CM | POA: Diagnosis not present

## 2019-06-14 DIAGNOSIS — E282 Polycystic ovarian syndrome: Secondary | ICD-10-CM

## 2019-06-14 DIAGNOSIS — F3342 Major depressive disorder, recurrent, in full remission: Secondary | ICD-10-CM | POA: Diagnosis not present

## 2019-06-14 DIAGNOSIS — M797 Fibromyalgia: Secondary | ICD-10-CM

## 2019-06-14 DIAGNOSIS — G43719 Chronic migraine without aura, intractable, without status migrainosus: Secondary | ICD-10-CM | POA: Diagnosis not present

## 2019-06-14 DIAGNOSIS — K5790 Diverticulosis of intestine, part unspecified, without perforation or abscess without bleeding: Secondary | ICD-10-CM | POA: Diagnosis not present

## 2019-06-14 DIAGNOSIS — E119 Type 2 diabetes mellitus without complications: Secondary | ICD-10-CM

## 2019-06-14 DIAGNOSIS — F3160 Bipolar disorder, current episode mixed, unspecified: Secondary | ICD-10-CM | POA: Diagnosis not present

## 2019-06-14 DIAGNOSIS — R739 Hyperglycemia, unspecified: Secondary | ICD-10-CM | POA: Diagnosis not present

## 2019-06-14 LAB — BAYER DCA HB A1C WAIVED: HB A1C (BAYER DCA - WAIVED): 5.6 % (ref <7.0)

## 2019-06-14 MED ORDER — MELOXICAM 15 MG PO TABS: 15.0000 mg | ORAL_TABLET | Freq: Every day | ORAL | 3 refills | Status: DC

## 2019-06-14 MED ORDER — LURASIDONE HCL 40 MG PO TABS: 40.0000 mg | ORAL_TABLET | Freq: Every day | ORAL | 1 refills | Status: DC

## 2019-06-14 MED ORDER — METFORMIN HCL ER 500 MG PO TB24: 500.0000 mg | ORAL_TABLET | Freq: Two times a day (BID) | ORAL | 1 refills | Status: DC

## 2019-06-14 NOTE — Patient Instructions (Signed)

## 2019-06-14 NOTE — Progress Notes (Signed)
Subjective:    Patient ID: Rachael Allen, female    DOB: 10-22-1971, 48 y.o.   MRN: [id number]   Chief Complaint: Medical Management of Chronic Issues    HPI:  1. Fibromyalgia muscle pain Patient is in pain everyday. Cold seems to make her worse and heat seems to help. She is still able to get done what she needs  Takes mobic which helps some.  2. Bipolar affective disorder, current episode mixed, current episode severity unspecified ([place]) Is currently on latuda and says she is doing well.  3. Primary narcolepsy with cataplexy Has been much better lately. Says that she has not been falling asleep during the day. Wears CPAP at night.  4. Recurrent major depressive disorder, in full remission ([place]) Again is on latuda and has her undere good control. Depression screen [facility] 2/9 06/14/2019 12/21/2018 11/17/2018  Decreased Interest 0 0 1  Down, Depressed, Hopeless 0 0 0  PHQ - 2 Score 0 0 1  Altered sleeping - - -  Tired, decreased energy - - -  Change in appetite - - -  Feeling bad or failure about yourself  - - -  Trouble concentrating - - -  Moving slowly or fidgety/restless - - -  Suicidal thoughts - - -  PHQ-9 Score - - -  Difficult doing work/chores - - -      5. Diverticulosis No recent flare ups of diverticulitis. She know what foods cause flare up and she tries to avoid those  6. Intractable chronic migraine without aura and without status migrainosus No recent miugraines  7. diabetes Last fasting blood sugar was 131 with HGBA1c of 5.7. we went ahead and added metformin bid. She doe snot check blood sugars at home.  8 morbid obesity Weight is unchanged from last visit  Outpatient Encounter Medications as of 06/14/2019  Medication Sig  . lurasidone (LATUDA) 40 MG TABS tablet Take 40 mg by mouth daily with breakfast.  . meloxicam (MOBIC) 15 MG tablet Take 1 tablet (15 mg total) by mouth daily.  . metFORMIN (GLUCOPHAGE-XR) 500 MG 24 hr tablet Take 1 tablet (500 mg  total) by mouth 2 (two) times daily.  . valACYclovir (VALTREX) 1000 MG tablet TAKE 2 TABLETS TWICE A DAY FOR 1 DAY THEN AS NEEDED FOR FEVER BLISTERS     Past Surgical History:  Procedure Laterality Date  . CESAREAN SECTION    . TONSILLECTOMY AND ADENOIDECTOMY      Family History  Problem Relation Age of Onset  . Hypertension Mother   . Mental illness Father   . Hypertension Sister     New complaints: none today  Social history: Lives with husband and stays cooped up in her house.     Review of Systems  Constitutional: Negative for activity change and appetite change.  HENT: Negative.   Eyes: Negative for pain.  Respiratory: Negative for shortness of breath.   Cardiovascular: Negative for chest pain, palpitations and leg swelling.  Gastrointestinal: Negative for abdominal pain.  Endocrine: Negative for polydipsia.  Genitourinary: Negative.   Skin: Negative for rash.  Neurological: Negative for dizziness, weakness and headaches.  Hematological: Does not bruise/bleed easily.  Psychiatric/Behavioral: Negative.   All other systems reviewed and are negative.      Objective:   Physical Exam Vitals signs and nursing note reviewed.  Constitutional:      General: She is not in acute distress.    Appearance: Normal appearance. She is well-developed.  HENT:  Head: Normocephalic.     Nose: Nose normal.  Eyes:     Pupils: Pupils are equal, round, and reactive to light.  Neck:     Musculoskeletal: Normal range of motion and neck supple.     Vascular: No carotid bruit or JVD.  Cardiovascular:     Rate and Rhythm: Normal rate and regular rhythm.     Heart sounds: Normal heart sounds.  Pulmonary:     Effort: Pulmonary effort is normal. No respiratory distress.     Breath sounds: Normal breath sounds. No wheezing or rales.  Chest:     Chest wall: No tenderness.  Abdominal:     General: Bowel sounds are normal. There is no distension or abdominal bruit.      Palpations: Abdomen is soft. There is no hepatomegaly, splenomegaly, mass or pulsatile mass.     Tenderness: There is no abdominal tenderness.  Musculoskeletal: Normal range of motion.  Lymphadenopathy:     Cervical: No cervical adenopathy.  Skin:    General: Skin is warm and dry.  Neurological:     Mental Status: She is alert and oriented to person, place, and time.     Deep Tendon Reflexes: Reflexes are normal and symmetric.  Psychiatric:        Behavior: Behavior normal.        Thought Content: Thought content normal.        Judgment: Judgment normal.     hgba1c 5.6%  BP 136/85   Pulse 76   Temp 98.4 F (36.9 C) (Oral)   Ht '5\' 4"'  (1.626 m)   Wt 290 lb (131.5 kg)   BMI 49.78 kg/m      Assessment & Plan:  IANTHA TITSWORTH comes in today with chief complaint of Medical Management of Chronic Issues   Diagnosis and orders addressed:  1. Fibromyalgia muscle pain Keep muscles warm - meloxicam (MOBIC) 15 MG tablet; Take 1 tablet (15 mg total) by mouth daily.  Dispense: 30 tablet; Refill: 3  2. Bipolar affective disorder, current episode mixed, current episode severity unspecified ([place]) Stress management - lurasidone (LATUDA) 40 MG TABS tablet; Take 1 tablet (40 mg total) by mouth daily with breakfast.  Dispense: 90 tablet; Refill: 1  3. Primary narcolepsy with cataplexy  4. Recurrent major depressive disorder, in full remission ([place])  5. Diverticulosis Watch diet  6. Intractable chronic migraine without aura and without status migrainosus avooid caffeine  7. Type 2 diabetes mellitus without complication, without long-term current use of insulin ([facility]) Watch carbs in diet - Bayer DCA Hb A1c Waived - CMP14+EGFR - Lipid panel  8. PCOS (polycystic ovarian syndrome) Can take metformin 1x a day if would like - metFORMIN (GLUCOPHAGE-XR) 500 MG 24 hr tablet; Take 1 tablet (500 mg total) by mouth 2 (two) times daily.  Dispense: 180 tablet; Refill: 1 - MM DIAG BREAST TOMO  BILATERAL; Future   Labs pending Health Maintenance reviewed Diet and exercise encouraged  Follow up plan: 6 months   Mary-Margaret Hassell Done, FNP

## 2019-06-15 LAB — CMP14+EGFR
ALT: 17 IU/L (ref 0–32)
AST: 12 IU/L (ref 0–40)
Albumin/Globulin Ratio: 1.6 (ref 1.2–2.2)
Albumin: 4 g/dL (ref 3.8–4.8)
Alkaline Phosphatase: 61 IU/L (ref 39–117)
BUN/Creatinine Ratio: 11 (ref 9–23)
BUN: 9 mg/dL (ref 6–24)
Bilirubin Total: 0.4 mg/dL (ref 0.0–1.2)
CO2: 24 mmol/L (ref 20–29)
Calcium: 9.1 mg/dL (ref 8.7–10.2)
Chloride: 103 mmol/L (ref 96–106)
Creatinine, Ser: 0.84 mg/dL (ref 0.57–1.00)
GFR calc Af Amer: 95 mL/min/{1.73_m2} (ref >59)
GFR calc non Af Amer: 82 mL/min/{1.73_m2} (ref >59)
Globulin, Total: 2.5 g/dL (ref 1.5–4.5)
Glucose: 150 mg/dL — ABNORMAL HIGH (ref 65–99)
Potassium: 4.1 mmol/L (ref 3.5–5.2)
Sodium: 140 mmol/L (ref 134–144)
Total Protein: 6.5 g/dL (ref 6.0–8.5)

## 2019-06-15 LAB — LIPID PANEL
Chol/HDL Ratio: 5.1 ratio — ABNORMAL HIGH (ref 0.0–4.4)
Cholesterol, Total: 157 mg/dL (ref 100–199)
HDL: 31 mg/dL — ABNORMAL LOW (ref >39)
LDL Calculated: 83 mg/dL (ref 0–99)
Triglycerides: 214 mg/dL — ABNORMAL HIGH (ref 0–149)
VLDL Cholesterol Cal: 43 mg/dL — ABNORMAL HIGH (ref 5–40)

## 2019-06-26 DIAGNOSIS — F3181 Bipolar II disorder: Secondary | ICD-10-CM | POA: Diagnosis not present

## 2019-06-26 DIAGNOSIS — G4730 Sleep apnea, unspecified: Secondary | ICD-10-CM | POA: Diagnosis not present

## 2019-07-03 ENCOUNTER — Telehealth: Payer: Self-pay | Admitting: Nurse Practitioner

## 2019-07-03 NOTE — Telephone Encounter (Signed)
Results given.

## 2019-07-17 DIAGNOSIS — S338XXA Sprain of other parts of lumbar spine and pelvis, initial encounter: Secondary | ICD-10-CM | POA: Diagnosis not present

## 2019-07-17 DIAGNOSIS — S134XXA Sprain of ligaments of cervical spine, initial encounter: Secondary | ICD-10-CM | POA: Diagnosis not present

## 2019-07-17 DIAGNOSIS — S233XXA Sprain of ligaments of thoracic spine, initial encounter: Secondary | ICD-10-CM | POA: Diagnosis not present

## 2019-08-13 DIAGNOSIS — G4733 Obstructive sleep apnea (adult) (pediatric): Secondary | ICD-10-CM | POA: Diagnosis not present

## 2019-08-13 DIAGNOSIS — Z9989 Dependence on other enabling machines and devices: Secondary | ICD-10-CM | POA: Diagnosis not present

## 2019-08-29 DIAGNOSIS — Z1231 Encounter for screening mammogram for malignant neoplasm of breast: Secondary | ICD-10-CM | POA: Diagnosis not present

## 2019-08-29 LAB — HM MAMMOGRAPHY

## 2019-09-17 ENCOUNTER — Telehealth: Payer: Self-pay | Admitting: Nurse Practitioner

## 2019-09-17 NOTE — Telephone Encounter (Signed)
Our x-ray personnel will call with normal result.

## 2019-09-18 ENCOUNTER — Other Ambulatory Visit: Payer: Self-pay | Admitting: Nurse Practitioner

## 2019-09-18 DIAGNOSIS — E282 Polycystic ovarian syndrome: Secondary | ICD-10-CM

## 2019-09-18 MED ORDER — METFORMIN HCL ER 500 MG PO TB24: 500.0000 mg | ORAL_TABLET | Freq: Two times a day (BID) | ORAL | 1 refills | Status: DC

## 2019-09-20 DIAGNOSIS — G4733 Obstructive sleep apnea (adult) (pediatric): Secondary | ICD-10-CM | POA: Diagnosis not present

## 2019-09-27 ENCOUNTER — Other Ambulatory Visit: Payer: Self-pay | Admitting: *Deleted

## 2019-09-27 MED ORDER — VALACYCLOVIR HCL 1 G PO TABS: ORAL_TABLET | ORAL | 1 refills | Status: DC

## 2019-10-30 DIAGNOSIS — G4733 Obstructive sleep apnea (adult) (pediatric): Secondary | ICD-10-CM | POA: Diagnosis not present

## 2019-10-30 DIAGNOSIS — Z9989 Dependence on other enabling machines and devices: Secondary | ICD-10-CM | POA: Diagnosis not present

## 2019-11-04 DIAGNOSIS — G4733 Obstructive sleep apnea (adult) (pediatric): Secondary | ICD-10-CM | POA: Diagnosis not present

## 2019-11-29 DIAGNOSIS — Z6841 Body Mass Index (BMI) 40.0 and over, adult: Secondary | ICD-10-CM | POA: Diagnosis not present

## 2019-11-29 DIAGNOSIS — G4733 Obstructive sleep apnea (adult) (pediatric): Secondary | ICD-10-CM | POA: Diagnosis not present

## 2019-12-11 DIAGNOSIS — F3181 Bipolar II disorder: Secondary | ICD-10-CM | POA: Diagnosis not present

## 2019-12-11 DIAGNOSIS — G4730 Sleep apnea, unspecified: Secondary | ICD-10-CM | POA: Diagnosis not present

## 2019-12-17 ENCOUNTER — Other Ambulatory Visit: Payer: Self-pay

## 2019-12-18 ENCOUNTER — Encounter: Payer: Self-pay | Admitting: Nurse Practitioner

## 2019-12-18 ENCOUNTER — Ambulatory Visit (INDEPENDENT_AMBULATORY_CARE_PROVIDER_SITE_OTHER): Payer: BC Managed Care – PPO | Admitting: Nurse Practitioner

## 2019-12-18 VITALS — BP 153/89 | HR 83 | Temp 98.6°F | Resp 20 | Ht 64.0 in | Wt 300.0 lb

## 2019-12-18 DIAGNOSIS — K5790 Diverticulosis of intestine, part unspecified, without perforation or abscess without bleeding: Secondary | ICD-10-CM

## 2019-12-18 DIAGNOSIS — M797 Fibromyalgia: Secondary | ICD-10-CM

## 2019-12-18 DIAGNOSIS — F3342 Major depressive disorder, recurrent, in full remission: Secondary | ICD-10-CM | POA: Diagnosis not present

## 2019-12-18 DIAGNOSIS — F3160 Bipolar disorder, current episode mixed, unspecified: Secondary | ICD-10-CM | POA: Diagnosis not present

## 2019-12-18 DIAGNOSIS — G43719 Chronic migraine without aura, intractable, without status migrainosus: Secondary | ICD-10-CM

## 2019-12-18 DIAGNOSIS — Z0000 Encounter for general adult medical examination without abnormal findings: Secondary | ICD-10-CM

## 2019-12-18 DIAGNOSIS — E282 Polycystic ovarian syndrome: Secondary | ICD-10-CM

## 2019-12-18 DIAGNOSIS — I10 Essential (primary) hypertension: Secondary | ICD-10-CM | POA: Diagnosis not present

## 2019-12-18 DIAGNOSIS — Z6841 Body Mass Index (BMI) 40.0 and over, adult: Secondary | ICD-10-CM

## 2019-12-18 MED ORDER — LISINOPRIL 20 MG PO TABS: 20.0000 mg | ORAL_TABLET | Freq: Every day | ORAL | 3 refills | Status: DC

## 2019-12-18 MED ORDER — METFORMIN HCL ER 500 MG PO TB24: 500.0000 mg | ORAL_TABLET | Freq: Two times a day (BID) | ORAL | 1 refills | Status: DC

## 2019-12-18 MED ORDER — MELOXICAM 15 MG PO TABS: 15.0000 mg | ORAL_TABLET | Freq: Every day | ORAL | 1 refills | Status: DC

## 2019-12-18 NOTE — Progress Notes (Signed)
Subjective:    Patient ID: Rachael Allen, female    DOB: 1971-03-11, 49 y.o.   MRN: [id number]   Chief Complaint: Medical Management of Chronic Issues (6 mo  ( fibromyalgia ) )    HPI:  1. Fibromyalgia muscle pain Has daily pain, but she just tries to keep on going. Is on mobic and helps some. She thinks aleve helps more.  2. Bipolar affective disorder, current episode mixed, current episode severity unspecified ([place]) Is on latuda- sees Aaron Edelman at the [facility]. Has follow iup every 6 months. Depression screen [facility] 2/9 12/18/2019 06/14/2019 12/21/2018  Decreased Interest 0 0 0  Down, Depressed, Hopeless 0 0 0  PHQ - 2 Score 0 0 0  Altered sleeping - - -  Tired, decreased energy - - -  Change in appetite - - -  Feeling bad or failure about yourself  - - -  Trouble concentrating - - -  Moving slowly or fidgety/restless - - -  Suicidal thoughts - - -  PHQ-9 Score - - -  Difficult doing work/chores - - -     3. PCOS (polycystic ovarian syndrome) Doing ok as far as she knows- take metformin BID. No onger has period due to IUD.  4. Healthcare maintenance  5. Intractable chronic migraine without aura and without status migrainosus No recent migraines. Has not needed to take any meds for this either.  6. Recurrent major depressive disorder, in full remission ([place]) See previous note- sees pysch every 6 months  7. Diverticulosis No recent flare ups with diet conmtrol  8. Class 3 severe obesity due to excess calories without serious comorbidity with body mass index (BMI) of 50.0 to 59.9 in adult ([facility]) Weight I sup 10 lbs from previous visit. Started weight watchers again yesterday Wt Readings from Last 3 Encounters:  12/18/19 300 lb (136.1 kg)  06/14/19 290 lb (131.5 kg)  12/21/18 284 lb 12.8 oz (129.2 kg)   BMI Readings from Last 3 Encounters:  12/18/19 51.49 kg/m  06/14/19 49.78 kg/m  12/21/18 48.89 kg/m       Outpatient Encounter Medications as of  12/18/2019  Medication Sig  . lurasidone (LATUDA) 40 MG TABS tablet Take 1 tablet (40 mg total) by mouth daily with breakfast.  . meloxicam (MOBIC) 15 MG tablet Take 1 tablet (15 mg total) by mouth daily.  . metFORMIN (GLUCOPHAGE-XR) 500 MG 24 hr tablet Take 1 tablet (500 mg total) by mouth 2 (two) times daily.  . valACYclovir (VALTREX) 1000 MG tablet TAKE 2 TABLETS TWICE A DAY FOR 1 DAY THEN AS NEEDED FOR FEVER BLISTERS (Patient not taking: Reported on 12/18/2019)     Past Surgical History:  Procedure Laterality Date  . CESAREAN SECTION    . TONSILLECTOMY AND ADENOIDECTOMY      Family History  Problem Relation Age of Onset  . Hypertension Mother   . Mental illness Father   . Hypertension Sister     New complaints: None today  Social history: Lives with husband and daughter  Controlled substance contract: n/a    Review of Systems  Constitutional: Negative for diaphoresis.  Eyes: Negative for pain.  Respiratory: Negative for shortness of breath.   Cardiovascular: Negative for chest pain, palpitations and leg swelling.  Gastrointestinal: Negative for abdominal pain.  Endocrine: Negative for polydipsia.  Skin: Negative for rash.  Neurological: Negative for dizziness, weakness and headaches.  Hematological: Does not bruise/bleed easily.  All other systems reviewed and are negative.  Objective:   Physical Exam Vitals and nursing note reviewed.  Constitutional:      General: She is not in acute distress.    Appearance: Normal appearance. She is well-developed.  HENT:     Head: Normocephalic.     Nose: Nose normal.  Eyes:     Pupils: Pupils are equal, round, and reactive to light.  Neck:     Vascular: No carotid bruit or JVD.  Cardiovascular:     Rate and Rhythm: Normal rate and regular rhythm.     Heart sounds: Normal heart sounds.  Pulmonary:     Effort: Pulmonary effort is normal. No respiratory distress.     Breath sounds: Normal breath sounds. No wheezing  or rales.  Chest:     Chest wall: No tenderness.  Abdominal:     General: Bowel sounds are normal. There is no distension or abdominal bruit.     Palpations: Abdomen is soft. There is no hepatomegaly, splenomegaly, mass or pulsatile mass.     Tenderness: There is no abdominal tenderness.  Musculoskeletal:        General: Normal range of motion.     Cervical back: Normal range of motion and neck supple.  Lymphadenopathy:     Cervical: No cervical adenopathy.  Skin:    General: Skin is warm and dry.  Neurological:     Mental Status: She is alert and oriented to person, place, and time.     Deep Tendon Reflexes: Reflexes are normal and symmetric.  Psychiatric:        Behavior: Behavior normal.        Thought Content: Thought content normal.        Judgment: Judgment normal.    BP (!) 153/89 (BP Location: Left Arm, Cuff Size: Large)   Pulse 83   Temp 98.6 F (37 C)   Resp 20   Ht _0  (1.626 m)   Wt 300 lb (136.1 kg)   SpO2 98%   BMI 51.49 kg/m  BP Readings from Last 3 Encounters:  12/18/19 (!) 153/89  06/14/19 136/85  12/21/18 (!) 149/97           Assessment & Plan:  Rachael Allen comes in today with chief complaint of Medical Management of Chronic Issues (6 mo  ( fibromyalgia ) )   Diagnosis and orders addressed:  1. Fibromyalgia muscle pain Stay active to keep muscles warm - CBC with Differential/Platelet - meloxicam (MOBIC) 15 MG tablet; Take 1 tablet (15 mg total) by mouth daily.  Dispense: 90 tablet; Refill: 1  2. Bipolar affective disorder, current episode mixed, current episode severity unspecified ([place]) Keep follow up with [facility] clinic - CBC with Differential/Platelet  3. PCOS (polycystic ovarian syndrome) - CBC with Differential/Platelet - CMP14+EGFR - metFORMIN (GLUCOPHAGE-XR) 500 MG 24 hr tablet; Take 1 tablet (500 mg total) by mouth 2 (two) times daily.  Dispense: 180 tablet; Refill: 1  4. Healthcare maintenance - Lipid panel  5.  Intractable chronic migraine without aura and without status migrainosus Avoid caffeine  6. Recurrent major depressive disorder, in full remission ([place]) Keep follow up with mood disorder clinic  7. Diverticulosis Avoid foods with skin or seeds  8. Class 3 severe obesity due to excess calories without serious comorbidity with body mass index (BMI) of 50.0 to 59.9 in adult [facility]) Discussed diet and exercise for person with BMI >25 Will recheck weight in 3-6 months Weight watchers encourgaed  9. Essential hypertension Low sodium diet - lisinopril (  ZESTRIL) 20 MG tablet; Take 1 tablet (20 mg total) by mouth daily.  Dispense: 90 tablet; Refill: 3   Labs pending Health Maintenance reviewed Diet and exercise encouraged  Follow up plan: 6 months   Mary-Margaret Hassell Done, FNP

## 2019-12-19 LAB — CMP14+EGFR
ALT: 27 IU/L (ref 0–32)
AST: 17 IU/L (ref 0–40)
Albumin/Globulin Ratio: 1.2 (ref 1.2–2.2)
Albumin: 3.6 g/dL — ABNORMAL LOW (ref 3.8–4.8)
Alkaline Phosphatase: 70 IU/L (ref 39–117)
BUN/Creatinine Ratio: 10 (ref 9–23)
BUN: 7 mg/dL (ref 6–24)
Bilirubin Total: 0.3 mg/dL (ref 0.0–1.2)
CO2: 22 mmol/L (ref 20–29)
Calcium: 9.3 mg/dL (ref 8.7–10.2)
Chloride: 102 mmol/L (ref 96–106)
Creatinine, Ser: 0.71 mg/dL (ref 0.57–1.00)
GFR calc Af Amer: 116 mL/min/{1.73_m2} (ref >59)
GFR calc non Af Amer: 100 mL/min/{1.73_m2} (ref >59)
Globulin, Total: 3 g/dL (ref 1.5–4.5)
Glucose: 128 mg/dL — ABNORMAL HIGH (ref 65–99)
Potassium: 3.9 mmol/L (ref 3.5–5.2)
Sodium: 138 mmol/L (ref 134–144)
Total Protein: 6.6 g/dL (ref 6.0–8.5)

## 2019-12-19 LAB — CBC WITH DIFFERENTIAL/PLATELET
Basophils Absolute: 0.1 10*3/uL (ref 0.0–0.2)
Basos: 1 %
EOS (ABSOLUTE): 0.2 10*3/uL (ref 0.0–0.4)
Eos: 3 %
Hematocrit: 39.5 % (ref 34.0–46.6)
Hemoglobin: 13.8 g/dL (ref 11.1–15.9)
Immature Grans (Abs): 0.1 10*3/uL (ref 0.0–0.1)
Immature Granulocytes: 1 %
Lymphocytes Absolute: 1.9 10*3/uL (ref 0.7–3.1)
Lymphs: 26 %
MCH: 28.8 pg (ref 26.6–33.0)
MCHC: 34.9 g/dL (ref 31.5–35.7)
MCV: 82 fL (ref 79–97)
Monocytes Absolute: 0.3 10*3/uL (ref 0.1–0.9)
Monocytes: 5 %
Neutrophils Absolute: 4.6 10*3/uL (ref 1.4–7.0)
Neutrophils: 64 %
Platelets: 296 10*3/uL (ref 150–450)
RBC: 4.8 x10E6/uL (ref 3.77–5.28)
RDW: 14.1 % (ref 11.7–15.4)
WBC: 7.1 10*3/uL (ref 3.4–10.8)

## 2019-12-19 LAB — LIPID PANEL
Chol/HDL Ratio: 4.3 ratio (ref 0.0–4.4)
Cholesterol, Total: 143 mg/dL (ref 100–199)
HDL: 33 mg/dL — ABNORMAL LOW (ref >39)
LDL Chol Calc (NIH): 80 mg/dL (ref 0–99)
Triglycerides: 176 mg/dL — ABNORMAL HIGH (ref 0–149)
VLDL Cholesterol Cal: 30 mg/dL (ref 5–40)

## 2020-04-07 ENCOUNTER — Other Ambulatory Visit: Payer: Self-pay

## 2020-04-07 ENCOUNTER — Ambulatory Visit (INDEPENDENT_AMBULATORY_CARE_PROVIDER_SITE_OTHER): Payer: BC Managed Care – PPO | Admitting: Nurse Practitioner

## 2020-04-07 ENCOUNTER — Encounter: Payer: Self-pay | Admitting: Nurse Practitioner

## 2020-04-07 VITALS — BP 137/89 | HR 70 | Temp 97.7°F | Ht 64.0 in | Wt 299.0 lb

## 2020-04-07 DIAGNOSIS — Z30432 Encounter for removal of intrauterine contraceptive device: Secondary | ICD-10-CM

## 2020-04-07 DIAGNOSIS — Z30430 Encounter for insertion of intrauterine contraceptive device: Secondary | ICD-10-CM | POA: Diagnosis not present

## 2020-04-07 MED ORDER — LEVONORGESTREL 20 MCG/24HR IU IUD: 1.0000 | INTRAUTERINE_SYSTEM | Freq: Once | INTRAUTERINE | Status: DC

## 2020-04-07 NOTE — Patient Instructions (Signed)

## 2020-04-07 NOTE — Progress Notes (Signed)
Subjective:    Patient ID: Rachael Allen, female    DOB: 1971-03-18, 49 y.o.   MRN: [id number]   Chief Complaint: Contraception   HPI Patient comes in  Today for changing of her [place]. Her current mirena has ben in for 5 years. She had PAP 2 years ago which was normal. She is not due for follow up of her chronic issues until July. She has no new complaints today.   Review of Systems  Constitutional: Negative for diaphoresis.  Eyes: Negative for pain.  Respiratory: Negative for shortness of breath.   Cardiovascular: Negative for chest pain, palpitations and leg swelling.  Gastrointestinal: Negative for abdominal pain.  Endocrine: Negative for polydipsia.  Skin: Negative for rash.  Neurological: Negative for dizziness, weakness and headaches.  Hematological: Does not bruise/bleed easily.  All other systems reviewed and are negative.       Objective:   Physical Exam Vitals and nursing note reviewed.  Constitutional:      General: She is not in acute distress.    Appearance: Normal appearance. She is well-developed.  Neck:     Vascular: No carotid bruit or JVD.  Cardiovascular:     Rate and Rhythm: Normal rate and regular rhythm.     Heart sounds: Normal heart sounds.  Pulmonary:     Effort: Pulmonary effort is normal. No respiratory distress.     Breath sounds: Normal breath sounds. No wheezing or rales.  Chest:     Chest wall: No tenderness.  Abdominal:     General: Bowel sounds are normal. There is no distension or abdominal bruit.     Palpations: Abdomen is soft. There is no hepatomegaly, splenomegaly, mass or pulsatile mass.     Tenderness: There is no abdominal tenderness.  Musculoskeletal:        General: Normal range of motion.     Cervical back: Normal range of motion and neck supple.  Lymphadenopathy:     Cervical: No cervical adenopathy.  Skin:    General: Skin is warm and dry.  Neurological:     Mental Status: She is alert and oriented to person,  place, and time.     Deep Tendon Reflexes: Reflexes are normal and symmetric.  Psychiatric:        Behavior: Behavior normal.        Thought Content: Thought content normal.        Judgment: Judgment normal.    BP 137/89   Pulse 70   Temp 97.7 F (36.5 C) (Temporal)   Ht 5\' 4"  (1.626 m)   Wt 299 lb (135.6 kg)   BMI 51.32 kg/m   Procedure:  Lithotomy position  larde speculum inserted  Betadine to cervical os             Previous IUD removed intact  Single tooth tenaculum to ant lip of cervix  Sound measures 72mm  mirena inserted without complications  Tenaculum and speculum removed  Patient tolerated well        Assessment & Plan:  Rachael Allen in today with chief complaint of Contraception   1. Encounter for IUD removal  2. Encounter for IUD insertion Sone difficulty but patient tolerated well. No cramping upon leaving. Expect some bleeding    The above assessment and management plan was discussed with the patient. The patient verbalized understanding of and has agreed to the management plan. Patient is aware to call the clinic if symptoms persist or worsen. Patient is aware when  to return to the clinic for a follow-up visit. Patient educated on when it is appropriate to go to the emergency department.   Mary-Margaret Hassell Done, FNP

## 2020-05-14 ENCOUNTER — Telehealth: Payer: Self-pay | Admitting: Nurse Practitioner

## 2020-05-14 NOTE — Telephone Encounter (Signed)
Please advise on question

## 2020-05-15 NOTE — Telephone Encounter (Signed)
Ome people do bleed with mirena. Have some problems getting inserted this last time. Come in and have KUB done for placement.

## 2020-05-16 NOTE — Telephone Encounter (Signed)
Aware of provider's advice. 

## 2020-05-27 DIAGNOSIS — F4310 Post-traumatic stress disorder, unspecified: Secondary | ICD-10-CM | POA: Diagnosis not present

## 2020-05-27 DIAGNOSIS — G4730 Sleep apnea, unspecified: Secondary | ICD-10-CM | POA: Diagnosis not present

## 2020-05-27 DIAGNOSIS — F3181 Bipolar II disorder: Secondary | ICD-10-CM | POA: Diagnosis not present

## 2020-06-17 ENCOUNTER — Ambulatory Visit: Payer: Self-pay | Admitting: Nurse Practitioner

## 2020-07-03 ENCOUNTER — Emergency Department (HOSPITAL_COMMUNITY): Payer: BC Managed Care – PPO

## 2020-07-03 ENCOUNTER — Other Ambulatory Visit: Payer: Self-pay

## 2020-07-03 ENCOUNTER — Encounter (HOSPITAL_COMMUNITY): Payer: Self-pay | Admitting: Emergency Medicine

## 2020-07-03 ENCOUNTER — Emergency Department (HOSPITAL_COMMUNITY)
Admission: EM | Admit: 2020-07-03 | Discharge: 2020-07-03 | Disposition: A | Discharge status: Home / Self Care | Payer: BC Managed Care – PPO | Attending: Emergency Medicine | Admitting: Emergency Medicine

## 2020-07-03 DIAGNOSIS — R0902 Hypoxemia: Secondary | ICD-10-CM | POA: Diagnosis not present

## 2020-07-03 DIAGNOSIS — R52 Pain, unspecified: Secondary | ICD-10-CM | POA: Diagnosis not present

## 2020-07-03 DIAGNOSIS — M549 Dorsalgia, unspecified: Secondary | ICD-10-CM | POA: Insufficient documentation

## 2020-07-03 DIAGNOSIS — S3993XA Unspecified injury of pelvis, initial encounter: Secondary | ICD-10-CM | POA: Diagnosis not present

## 2020-07-03 DIAGNOSIS — K5790 Diverticulosis of intestine, part unspecified, without perforation or abscess without bleeding: Secondary | ICD-10-CM | POA: Diagnosis not present

## 2020-07-03 DIAGNOSIS — R0789 Other chest pain: Secondary | ICD-10-CM | POA: Diagnosis not present

## 2020-07-03 DIAGNOSIS — S299XXA Unspecified injury of thorax, initial encounter: Secondary | ICD-10-CM | POA: Diagnosis not present

## 2020-07-03 DIAGNOSIS — R109 Unspecified abdominal pain: Secondary | ICD-10-CM | POA: Insufficient documentation

## 2020-07-03 DIAGNOSIS — M4802 Spinal stenosis, cervical region: Secondary | ICD-10-CM | POA: Insufficient documentation

## 2020-07-03 DIAGNOSIS — Z791 Long term (current) use of non-steroidal anti-inflammatories (NSAID): Secondary | ICD-10-CM | POA: Insufficient documentation

## 2020-07-03 DIAGNOSIS — S199XXA Unspecified injury of neck, initial encounter: Secondary | ICD-10-CM | POA: Diagnosis not present

## 2020-07-03 DIAGNOSIS — S3991XA Unspecified injury of abdomen, initial encounter: Secondary | ICD-10-CM | POA: Diagnosis not present

## 2020-07-03 DIAGNOSIS — R079 Chest pain, unspecified: Secondary | ICD-10-CM | POA: Diagnosis not present

## 2020-07-03 DIAGNOSIS — R519 Headache, unspecified: Secondary | ICD-10-CM | POA: Diagnosis not present

## 2020-07-03 DIAGNOSIS — S0990XA Unspecified injury of head, initial encounter: Secondary | ICD-10-CM | POA: Diagnosis not present

## 2020-07-03 DIAGNOSIS — R1011 Right upper quadrant pain: Secondary | ICD-10-CM | POA: Insufficient documentation

## 2020-07-03 DIAGNOSIS — M542 Cervicalgia: Secondary | ICD-10-CM | POA: Diagnosis not present

## 2020-07-03 DIAGNOSIS — I10 Essential (primary) hypertension: Secondary | ICD-10-CM | POA: Diagnosis not present

## 2020-07-03 DIAGNOSIS — Z91040 Latex allergy status: Secondary | ICD-10-CM | POA: Diagnosis not present

## 2020-07-03 DIAGNOSIS — Z041 Encounter for examination and observation following transport accident: Secondary | ICD-10-CM | POA: Diagnosis not present

## 2020-07-03 LAB — CBC WITH DIFFERENTIAL/PLATELET
Abs Immature Granulocytes: 0.06 10*3/uL (ref 0.00–0.07)
Basophils Absolute: 0.1 10*3/uL (ref 0.0–0.1)
Basophils Relative: 1 %
Eosinophils Absolute: 0.2 10*3/uL (ref 0.0–0.5)
Eosinophils Relative: 2 %
HCT: 40.2 % (ref 36.0–46.0)
Hemoglobin: 13.3 g/dL (ref 12.0–15.0)
Immature Granulocytes: 1 %
Lymphocytes Relative: 20 %
Lymphs Abs: 2.1 10*3/uL (ref 0.7–4.0)
MCH: 28.4 pg (ref 26.0–34.0)
MCHC: 33.1 g/dL (ref 30.0–36.0)
MCV: 85.9 fL (ref 80.0–100.0)
Monocytes Absolute: 0.7 10*3/uL (ref 0.1–1.0)
Monocytes Relative: 6 %
Neutro Abs: 7.7 10*3/uL (ref 1.7–7.7)
Neutrophils Relative %: 70 %
Platelets: 307 10*3/uL (ref 150–400)
RBC: 4.68 MIL/uL (ref 3.87–5.11)
RDW: 13.9 % (ref 11.5–15.5)
WBC: 10.9 10*3/uL — ABNORMAL HIGH (ref 4.0–10.5)
nRBC: 0 % (ref 0.0–0.2)

## 2020-07-03 LAB — COMPREHENSIVE METABOLIC PANEL
ALT: 34 U/L (ref 0–44)
AST: 25 U/L (ref 15–41)
Albumin: 3.9 g/dL (ref 3.5–5.0)
Alkaline Phosphatase: 44 U/L (ref 38–126)
Anion gap: 11 (ref 5–15)
BUN: 8 mg/dL (ref 6–20)
CO2: 23 mmol/L (ref 22–32)
Calcium: 9.3 mg/dL (ref 8.9–10.3)
Chloride: 105 mmol/L (ref 98–111)
Creatinine, Ser: 0.86 mg/dL (ref 0.44–1.00)
GFR calc Af Amer: >60 mL/min (ref >60)
GFR calc non Af Amer: >60 mL/min (ref >60)
Glucose, Bld: 122 mg/dL — ABNORMAL HIGH (ref 70–99)
Potassium: 3.9 mmol/L (ref 3.5–5.1)
Sodium: 139 mmol/L (ref 135–145)
Total Bilirubin: 0.6 mg/dL (ref 0.3–1.2)
Total Protein: 6.9 g/dL (ref 6.5–8.1)

## 2020-07-03 LAB — I-STAT BETA HCG BLOOD, ED (MC, WL, AP ONLY): I-stat hCG, quantitative: <5 m[IU]/mL (ref <5)

## 2020-07-03 MED ORDER — METHOCARBAMOL 500 MG PO TABS
500.0000 mg | ORAL_TABLET | Freq: Once | ORAL | Status: AC
  Administered 2020-07-03: 500 mg via ORAL
  Filled 2020-07-03: qty 1

## 2020-07-03 MED ORDER — IOHEXOL 300 MG/ML  SOLN
100.0000 mL | Freq: Once | INTRAMUSCULAR | Status: AC | PRN
  Administered 2020-07-03: 100 mL via INTRAVENOUS

## 2020-07-03 MED ORDER — METHOCARBAMOL 500 MG PO TABS
500.0000 mg | ORAL_TABLET | Freq: Three times a day (TID) | ORAL | 0 refills | Status: DC | PRN
Start: 2020-07-03 — End: 2021-02-09

## 2020-07-03 MED ORDER — FENTANYL CITRATE (PF) 100 MCG/2ML IJ SOLN
50.0000 ug | Freq: Once | INTRAMUSCULAR | Status: AC
  Administered 2020-07-03: 50 ug via INTRAVENOUS
  Filled 2020-07-03: qty 2

## 2020-07-03 MED ORDER — SODIUM CHLORIDE 0.9 % IV BOLUS
1000.0000 mL | Freq: Once | INTRAVENOUS | Status: AC
  Administered 2020-07-03: 1000 mL via INTRAVENOUS

## 2020-07-03 NOTE — ED Notes (Signed)
Discharge instructions discussed with pt. Pt verbalized instructions with no questions at this time. Pt to go home at this time
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
## 2020-07-03 NOTE — ED Notes (Signed)
Patient transported to CT 
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
## 2020-07-21 DIAGNOSIS — L821 Other seborrheic keratosis: Secondary | ICD-10-CM | POA: Diagnosis not present

## 2020-07-21 DIAGNOSIS — L814 Other melanin hyperpigmentation: Secondary | ICD-10-CM | POA: Diagnosis not present

## 2020-07-21 DIAGNOSIS — D485 Neoplasm of uncertain behavior of skin: Secondary | ICD-10-CM | POA: Diagnosis not present

## 2020-07-21 DIAGNOSIS — L559 Sunburn, unspecified: Secondary | ICD-10-CM | POA: Diagnosis not present

## 2020-07-21 DIAGNOSIS — D225 Melanocytic nevi of trunk: Secondary | ICD-10-CM | POA: Diagnosis not present

## 2020-08-06 DIAGNOSIS — D485 Neoplasm of uncertain behavior of skin: Secondary | ICD-10-CM | POA: Diagnosis not present

## 2020-08-07 ENCOUNTER — Other Ambulatory Visit: Payer: Self-pay

## 2020-08-07 ENCOUNTER — Other Ambulatory Visit: Payer: BC Managed Care – PPO

## 2020-08-07 DIAGNOSIS — Z20822 Contact with and (suspected) exposure to covid-19: Secondary | ICD-10-CM | POA: Diagnosis not present

## 2020-08-08 ENCOUNTER — Other Ambulatory Visit: Payer: Self-pay

## 2020-08-08 ENCOUNTER — Encounter: Payer: Self-pay | Admitting: Nurse Practitioner

## 2020-08-08 ENCOUNTER — Ambulatory Visit (INDEPENDENT_AMBULATORY_CARE_PROVIDER_SITE_OTHER): Payer: BC Managed Care – PPO | Admitting: Nurse Practitioner

## 2020-08-08 VITALS — BP 115/77 | HR 62 | Temp 98.0°F | Resp 20 | Ht 64.0 in | Wt 301.0 lb

## 2020-08-08 DIAGNOSIS — Z1159 Encounter for screening for other viral diseases: Secondary | ICD-10-CM | POA: Diagnosis not present

## 2020-08-08 DIAGNOSIS — I10 Essential (primary) hypertension: Secondary | ICD-10-CM

## 2020-08-08 DIAGNOSIS — E119 Type 2 diabetes mellitus without complications: Secondary | ICD-10-CM | POA: Diagnosis not present

## 2020-08-08 DIAGNOSIS — Z6841 Body Mass Index (BMI) 40.0 and over, adult: Secondary | ICD-10-CM

## 2020-08-08 DIAGNOSIS — M797 Fibromyalgia: Secondary | ICD-10-CM | POA: Diagnosis not present

## 2020-08-08 DIAGNOSIS — F3342 Major depressive disorder, recurrent, in full remission: Secondary | ICD-10-CM | POA: Diagnosis not present

## 2020-08-08 DIAGNOSIS — E282 Polycystic ovarian syndrome: Secondary | ICD-10-CM | POA: Diagnosis not present

## 2020-08-08 DIAGNOSIS — G43719 Chronic migraine without aura, intractable, without status migrainosus: Secondary | ICD-10-CM | POA: Diagnosis not present

## 2020-08-08 DIAGNOSIS — G47411 Narcolepsy with cataplexy: Secondary | ICD-10-CM

## 2020-08-08 LAB — SARS-COV-2, NAA 2 DAY TAT

## 2020-08-08 LAB — NOVEL CORONAVIRUS, NAA: SARS-CoV-2, NAA: NOT DETECTED

## 2020-08-08 LAB — BAYER DCA HB A1C WAIVED: HB A1C (BAYER DCA - WAIVED): 6 % (ref <7.0)

## 2020-08-08 MED ORDER — LISINOPRIL 20 MG PO TABS: 20.0000 mg | ORAL_TABLET | Freq: Every day | ORAL | 1 refills | Status: DC

## 2020-08-08 MED ORDER — METFORMIN HCL ER 500 MG PO TB24: 500.0000 mg | ORAL_TABLET | Freq: Two times a day (BID) | ORAL | 1 refills | Status: DC

## 2020-08-08 NOTE — Progress Notes (Signed)
Subjective:    Patient ID: Rachael Allen, female    DOB: 1970/12/17, 49 y.o.   MRN: [id number]   Chief Complaint: medical management of chronic issues     HPI:  1. Intractable chronic migraine without aura and without status migrainosus Denies any recent migraines, has been ~1 year since last episode.   2. PCOS (polycystic ovarian syndrome) Denies any recent issues, states no problems/pain since IUD placement.   3. Fibromyalgia muscle pain 4-5 episodes a month that last a couple days or up to a week. Aleve with some relief. Ice/heat with some relief. Walks for exercise.    4. Recurrent major depressive disorder, in full remission ([place]) Denies any depression, has been a very long time since last episodes ~June 2019/03/19 when father died.  Depression screen [facility] 2023-02-04 08/08/2020 04/07/2020 12/18/2019 06/14/2019 12/21/2018  Decreased Interest 0 0 0 0 0  Down, Depressed, Hopeless 0 0 0 0 0  PHQ - 2 Score 0 0 0 0 0  Altered sleeping 0 - - - -  Tired, decreased energy 0 - - - -  Change in appetite 0 - - - -  Feeling bad or failure about yourself  0 - - - -  Trouble concentrating 0 - - - -  Moving slowly or fidgety/restless 0 - - - -  Suicidal thoughts 0 - - - -  PHQ-9 Score 0 - - - -  Difficult doing work/chores Not difficult at all - - - -  Some recent data might be hidden     5. Primary narcolepsy with cataplexy ~2x a week, takes 1.5 hour nap during episodes. Denies interfering with daily activities.     Outpatient Encounter Medications as of 08/08/2020  Medication Sig  . [facility] B-12 500 MCG tablet Take 500 mcg by mouth daily.  Marland Kitchen lisinopril (ZESTRIL) 20 MG tablet Take 1 tablet (20 mg total) by mouth daily.  Marland Kitchen lurasidone (LATUDA) 40 MG TABS tablet Take 1 tablet (40 mg total) by mouth daily with breakfast. (Patient taking differently: Take 40 mg by mouth daily. )  . meloxicam (MOBIC) 15 MG tablet Take 1 tablet (15 mg total) by mouth daily. (Patient not taking: Reported on 07/03/2020)  .  metFORMIN (GLUCOPHAGE-XR) 500 MG 24 hr tablet Take 1 tablet (500 mg total) by mouth 2 (two) times daily.  . methocarbamol (ROBAXIN) 500 MG tablet Take 1 tablet (500 mg total) by mouth every 8 (eight) hours as needed for muscle spasms.  . Misc. Devices MISC Change therapy to Auto CPAP 5-17cm water pressure. DME [facility]  . valACYclovir (VALTREX) 1000 MG tablet TAKE 2 TABLETS TWICE A DAY FOR 1 DAY THEN AS NEEDED FOR FEVER BLISTERS (Patient taking differently: Take 1,000 mg by mouth daily as needed. )   No facility-administered encounter medications on file as of 08/08/2020.    Past Surgical History:  Procedure Laterality Date  . CESAREAN SECTION    . TONSILLECTOMY AND ADENOIDECTOMY      Family History  Problem Relation Age of Onset  . Hypertension Mother   . Mental illness Father   . Hypertension Sister     New complaints: No new complaints.   Social history: Lives at home with husband and son, hangs out with family for fun. Reads for fun.   Controlled substance contract: n/a    Review of Systems  Constitutional: Negative.   HENT: Negative.   Eyes: Negative.   Respiratory: Negative.   Cardiovascular: Negative.   Gastrointestinal: Negative.  Endocrine: Negative.   Genitourinary: Negative.   Musculoskeletal: Negative.   Skin: Negative.   Allergic/Immunologic: Negative.   Neurological: Negative.   Hematological: Negative.   Psychiatric/Behavioral: Negative.   All other systems reviewed and are negative.      Objective:   Physical Exam Vitals and nursing note reviewed.  Constitutional:      Appearance: Normal appearance.  HENT:     Head: Normocephalic and atraumatic.     Right Ear: Tympanic membrane, ear canal and external ear normal.     Left Ear: Tympanic membrane, ear canal and external ear normal.     Nose: Nose normal.     Mouth/Throat:     Mouth: Mucous membranes are moist.     Pharynx: Oropharynx is clear.  Eyes:     Extraocular Movements: Extraocular  movements intact.     Conjunctiva/sclera: Conjunctivae normal.     Pupils: Pupils are equal, round, and reactive to light.  Cardiovascular:     Rate and Rhythm: Normal rate and regular rhythm.     Pulses: Normal pulses.     Heart sounds: Normal heart sounds.  Pulmonary:     Effort: Pulmonary effort is normal.     Breath sounds: Normal breath sounds.  Abdominal:     General: Abdomen is flat. Bowel sounds are normal.     Palpations: Abdomen is soft.  Musculoskeletal:        General: Normal range of motion.     Cervical back: Normal range of motion.  Skin:    General: Skin is warm.     Capillary Refill: Capillary refill takes less than 2 seconds.  Neurological:     General: No focal deficit present.     Mental Status: She is alert and oriented to person, place, and time. Mental status is at baseline.  Psychiatric:        Mood and Affect: Mood normal.        Behavior: Behavior normal.        Thought Content: Thought content normal.        Judgment: Judgment normal.    BP 115/77   Pulse 62   Temp 98 F (36.7 C) (Temporal)   Resp 20   Ht 5\' 4"  (1.626 m)   Wt (!) 301 lb (136.5 kg)   SpO2 95%   BMI 51.67 kg/m   hgba1c 6.0%     Assessment & Plan:  KIMIA FINAN comes in today with chief complaint of No chief complaint on file.   Diagnosis and orders addressed:  1. Intractable chronic migraine without aura and without status migrainosus Avoid triggers that cause you to have migraines.   2. PCOS (polycystic ovarian syndrome) Report any pain or discomfort.   3. Fibromyalgia muscle pain Use heat or ice with non-steriodal anti-inflammatories for pain relief. Stay active.   4. Recurrent major depressive disorder, in full remission ([place]) Report any uncontrolled episodes of depression. Do activities you enjoy.  5. Primary narcolepsy with cataplexy Report any episodes that begin effecting daily activities. Go to bed and wake up at the same every day to establish a good  sleep routine.    Labs pending Health Maintenance reviewed Diet and exercise encouraged  Follow up plan: Follow up in 6 months.    Mary-Margaret Hassell Done, FNP

## 2020-08-08 NOTE — Patient Instructions (Signed)
Stress, Adult Stress is a normal reaction to life events. Stress is what you feel when life demands more than you are used to, or more than you think you can handle. Some stress can be useful, such as studying for a test or meeting a deadline at work. Stress that occurs too often or for too long can cause problems. It can affect your emotional health and interfere with relationships and normal daily activities. Too much stress can weaken your body's defense system (immune system) and increase your risk for physical illness. If you already have a medical problem, stress can make it worse. What are the causes? All sorts of life events can cause stress. An event that causes stress for one person may not be stressful for another person. Major life events, whether positive or negative, commonly cause stress. Examples include:  Losing a job or starting a new job.  Losing a loved one.  Moving to a new town or home.  Getting married or divorced.  Having a baby.  Getting injured or sick. Less obvious life events can also cause stress, especially if they occur day after day or in combination with each other. Examples include:  Working long hours.  Driving in traffic.  Caring for children.  Being in debt.  Being in a difficult relationship. What are the signs or symptoms? Stress can cause emotional symptoms, including:  Anxiety. This is feeling worried, afraid, on edge, overwhelmed, or out of control.  Anger, including irritation or impatience.  Depression. This is feeling sad, down, helpless, or guilty.  Trouble focusing, remembering, or making decisions. Stress can cause physical symptoms, including:  Aches and pains. These may affect your head, neck, back, stomach, or other areas of your body.  Tight muscles or a clenched jaw.  Low energy.  Trouble sleeping. Stress can cause unhealthy behaviors, including:  Eating to feel better (overeating) or skipping meals.  Working too  much or putting off tasks.  Smoking, drinking alcohol, or using drugs to feel better. How is this diagnosed? Stress is diagnosed through an assessment by your health care provider. He or she may diagnose this condition based on:  Your symptoms and any stressful life events.  Your medical history.  Tests to rule out other causes of your symptoms. Depending on your condition, your health care provider may refer you to a specialist for further evaluation. How is this treated?  Stress management techniques are the recommended treatment for stress. Medicine is not typically recommended for the treatment of stress. Techniques to reduce your reaction to stressful life events include:  Stress identification. Monitor yourself for symptoms of stress and identify what causes stress for you. These skills may help you to avoid or prepare for stressful events.  Time management. Set your priorities, keep a calendar of events, and learn to say no. Taking these actions can help you avoid making too many commitments. Techniques for coping with stress include:  Rethinking the problem. Try to think realistically about stressful events rather than ignoring them or overreacting. Try to find the positives in a stressful situation rather than focusing on the negatives.  Exercise. Physical exercise can release both physical and emotional tension. The key is to find a form of exercise that you enjoy and do it regularly.  Relaxation techniques. These relax the body and mind. The key is to find one or more that you enjoy and use the techniques regularly. Examples include: ? Meditation, deep breathing, or progressive relaxation techniques. ? Yoga or   tai chi. ? Biofeedback, mindfulness techniques, or journaling. ? Listening to music, being out in nature, or participating in other hobbies.  Practicing a healthy lifestyle. Eat a balanced diet, drink plenty of water, limit or avoid caffeine, and get plenty of  sleep.  Having a strong support network. Spend time with family, friends, or other people you enjoy being around. Express your feelings and talk things over with someone you trust. Counseling or talk therapy with a mental health professional may be helpful if you are having trouble managing stress on your own. Follow these instructions at home: Lifestyle   Avoid drugs.  Do not use any products that contain nicotine or tobacco, such as cigarettes, e-cigarettes, and chewing tobacco. If you need help quitting, ask your health care provider.  Limit alcohol intake to no more than 1 drink a day for nonpregnant women and 2 drinks a day for men. One drink equals 12 oz of beer, 5 oz of wine, or 1 oz of hard liquor  Do not use alcohol or drugs to relax.  Eat a balanced diet that includes fresh fruits and vegetables, whole grains, lean meats, fish, eggs, and beans, and low-fat dairy. Avoid processed foods and foods high in added fat, sugar, and salt.  Exercise at least 30 minutes on 5 or more days each week.  Get 7-8 hours of sleep each night. General instructions   Practice stress management techniques as discussed with your health care provider.  Drink enough fluid to keep your urine clear or pale yellow.  Take over-the-counter and prescription medicines only as told by your health care provider.  Keep all follow-up visits as told by your health care provider. This is important. Contact a health care provider if:  Your symptoms get worse.  You have new symptoms.  You feel overwhelmed by your problems and can no longer manage them on your own. Get help right away if:  You have thoughts of hurting yourself or others. If you ever feel like you may hurt yourself or others, or have thoughts about taking your own life, get help right away. You can go to your nearest emergency department or call:  Your local emergency services (911 in the [place]  A suicide crisis helpline, such as the  [place] at (316) 250-6172. This is open 24 hours a day. Summary  Stress is a normal reaction to life events. It can cause problems if it happens too often or for too long.  Practicing stress management techniques is the best way to treat stress.  Counseling or talk therapy with a mental health professional may be helpful if you are having trouble managing stress on your own. This information is not intended to replace advice given to you by your health care provider. Make sure you discuss any questions you have with your health care provider. Document Revised: 06/29/2019 Document Reviewed: 01/19/2017 Elsevier Patient Education  [place].

## 2020-08-09 LAB — CMP14+EGFR
ALT: 22 IU/L (ref 0–32)
AST: 13 IU/L (ref 0–40)
Albumin/Globulin Ratio: 1.6 (ref 1.2–2.2)
Albumin: 4.1 g/dL (ref 3.8–4.8)
Alkaline Phosphatase: 55 IU/L (ref 48–121)
BUN/Creatinine Ratio: 11 (ref 9–23)
BUN: 8 mg/dL (ref 6–24)
Bilirubin Total: 0.4 mg/dL (ref 0.0–1.2)
CO2: 26 mmol/L (ref 20–29)
Calcium: 9.4 mg/dL (ref 8.7–10.2)
Chloride: 102 mmol/L (ref 96–106)
Creatinine, Ser: 0.75 mg/dL (ref 0.57–1.00)
GFR calc Af Amer: 108 mL/min/{1.73_m2} (ref >59)
GFR calc non Af Amer: 94 mL/min/{1.73_m2} (ref >59)
Globulin, Total: 2.6 g/dL (ref 1.5–4.5)
Glucose: 96 mg/dL (ref 65–99)
Potassium: 4.4 mmol/L (ref 3.5–5.2)
Sodium: 141 mmol/L (ref 134–144)
Total Protein: 6.7 g/dL (ref 6.0–8.5)

## 2020-08-09 LAB — LIPID PANEL
Chol/HDL Ratio: 4.6 ratio — ABNORMAL HIGH (ref 0.0–4.4)
Cholesterol, Total: 139 mg/dL (ref 100–199)
HDL: 30 mg/dL — ABNORMAL LOW (ref >39)
LDL Chol Calc (NIH): 85 mg/dL (ref 0–99)
Triglycerides: 131 mg/dL (ref 0–149)
VLDL Cholesterol Cal: 24 mg/dL (ref 5–40)

## 2020-08-09 LAB — CBC WITH DIFFERENTIAL/PLATELET
Basophils Absolute: 0.1 10*3/uL (ref 0.0–0.2)
Basos: 1 %
EOS (ABSOLUTE): 0.3 10*3/uL (ref 0.0–0.4)
Eos: 4 %
Hematocrit: 38.4 % (ref 34.0–46.6)
Hemoglobin: 13.6 g/dL (ref 11.1–15.9)
Immature Grans (Abs): 0.1 10*3/uL (ref 0.0–0.1)
Immature Granulocytes: 1 %
Lymphocytes Absolute: 2.5 10*3/uL (ref 0.7–3.1)
Lymphs: 29 %
MCH: 29.8 pg (ref 26.6–33.0)
MCHC: 35.4 g/dL (ref 31.5–35.7)
MCV: 84 fL (ref 79–97)
Monocytes Absolute: 0.5 10*3/uL (ref 0.1–0.9)
Monocytes: 6 %
Neutrophils Absolute: 5 10*3/uL (ref 1.4–7.0)
Neutrophils: 59 %
Platelets: 298 10*3/uL (ref 150–450)
RBC: 4.57 x10E6/uL (ref 3.77–5.28)
RDW: 14.4 % (ref 11.7–15.4)
WBC: 8.4 10*3/uL (ref 3.4–10.8)

## 2020-08-09 LAB — HEPATITIS C ANTIBODY: Hep C Virus Ab: <0.1 s/co ratio (ref 0.0–0.9)

## 2020-08-11 ENCOUNTER — Telehealth: Payer: Self-pay | Admitting: Nurse Practitioner

## 2020-08-11 NOTE — Telephone Encounter (Signed)
  Incoming Patient Call  08/11/2020  What symptoms do you have? Patient seen MMM 8-27 and she has had cough since last Wednesday and forgot to mention to MMM at her appt. Had COVID test and was negative and wonders if the cough is coming from her blood pressure medicine. Only symptom she has  How long have you been sick? Since last Wednesday  Have you been seen for this problem? NO  If your provider decides to give you a prescription, which pharmacy would you like for it to be sent to? [facility]   Patient informed that this information will be sent to the clinical staff for review and that they should receive a follow up call.

## 2020-08-11 NOTE — Telephone Encounter (Signed)
Dont think it is coming from blood pressure meds unless she has  for a long time. Probably viral. Treat with delsym or mucinex and let me know if worsening.

## 2020-08-11 NOTE — Telephone Encounter (Signed)
Aware of recommendations. Patient has been taking delsym, mucinex and cough drops with no relief

## 2020-08-12 MED ORDER — PREDNISONE 20 MG PO TABS: ORAL_TABLET | ORAL | 0 refills | Status: DC

## 2020-08-12 NOTE — Telephone Encounter (Signed)
Set in rx for steroids 2 po daily for 5 days to see if will help with cough.

## 2020-08-21 ENCOUNTER — Ambulatory Visit: Payer: BC Managed Care – PPO | Attending: Internal Medicine

## 2020-08-21 DIAGNOSIS — Z23 Encounter for immunization: Secondary | ICD-10-CM

## 2020-08-21 NOTE — Progress Notes (Signed)
   Covid-19 Vaccination Clinic  Name:  Rachael Allen    MRN: [id number] DOB: 1971/02/19  08/21/2020  [name] was observed post Covid-19 immunization for 15 minutes without incident. She was provided with Vaccine Information Sheet and instruction to access the V-Safe system.   [name] was instructed to call 911 with any severe reactions post vaccine: Marland Kitchen Difficulty breathing  . Swelling of face and throat  . A fast heartbeat  . A bad rash all over body  . Dizziness and weakness   Immunizations Administered    Name Date Dose VIS Date Route   Pfizer COVID-19 Vaccine 08/21/2020  4:15 PM 0.3 mL 02/06/2019 Intramuscular   Manufacturer: [place]   Lot: [id number]   [place]: [address]-3

## 2020-09-08 ENCOUNTER — Telehealth: Payer: Self-pay

## 2020-09-08 NOTE — Telephone Encounter (Signed)
Attempted to reach pt , busy signal each attempt.

## 2020-09-08 NOTE — Telephone Encounter (Signed)
Pt would like to know if she should keep her appt for 2nd dose of covid vaccine if husband was exposed this past Saturday (9/25) He is scheduled to be tested tomorrow.

## 2020-09-09 NOTE — Telephone Encounter (Signed)
Phone call returned to pt.  Stated her husband was directly exposed to someone with COVID 9/25 for about 30 minutes.  Reported he went for testing yesterday.  Questioned if she should delay her 2nd dose of COVID vaccine, that is scheduled for Thursday, this week.  Advised of need for pt's husband to quarantine x 14 days, from date of exposure, even if his test comes back negative.  Explained the reason is due to the incubation period for the COVID virus.  Stated neither her husband or she have had any symptoms.  Advised to be cautious, I would advise delaying her vaccine, in case he husband gets sick and then has directly exposed her.  Explained that if pt. Takes vaccine on Thurs. And becomes symptomatic, it would be difficult to determine if her symptoms are related to the COVID virus or a reaction to the vaccine.  Advised pt. She can reach out to PCP for any different recommendation.  Verb. Understanding.

## 2020-09-11 ENCOUNTER — Ambulatory Visit: Payer: BC Managed Care – PPO

## 2020-09-18 ENCOUNTER — Ambulatory Visit: Payer: BC Managed Care – PPO | Attending: Internal Medicine

## 2020-09-18 DIAGNOSIS — Z23 Encounter for immunization: Secondary | ICD-10-CM

## 2020-09-18 NOTE — Progress Notes (Signed)
   Covid-19 Vaccination Clinic  Name:  Rachael Allen    MRN: [id number] DOB: 08/31/1971  09/18/2020  Rachael Allen was observed post Covid-19 immunization for 15 minutes without incident. She was provided with Vaccine Information Sheet and instruction to access the V-Safe system.   Rachael Allen was instructed to call 911 with any severe reactions post vaccine: Marland Kitchen Difficulty breathing  . Swelling of face and throat  . A fast heartbeat  . A bad rash all over body  . Dizziness and weakness   Immunizations Administered    Name Date Dose VIS Date Route   Pfizer COVID-19 Vaccine 09/18/2020  3:56 PM 0.3 mL 02/06/2019 Intramuscular   Manufacturer: [place]   Lot: [place]: [id number]

## 2020-09-22 ENCOUNTER — Other Ambulatory Visit: Payer: Self-pay | Admitting: Nurse Practitioner

## 2020-09-22 DIAGNOSIS — Z1231 Encounter for screening mammogram for malignant neoplasm of breast: Secondary | ICD-10-CM

## 2020-11-25 DIAGNOSIS — F3181 Bipolar II disorder: Secondary | ICD-10-CM | POA: Diagnosis not present

## 2020-11-25 DIAGNOSIS — F4310 Post-traumatic stress disorder, unspecified: Secondary | ICD-10-CM | POA: Diagnosis not present

## 2020-11-25 DIAGNOSIS — G4730 Sleep apnea, unspecified: Secondary | ICD-10-CM | POA: Diagnosis not present

## 2020-11-28 ENCOUNTER — Other Ambulatory Visit: Payer: Self-pay | Admitting: Nurse Practitioner

## 2020-12-10 ENCOUNTER — Ambulatory Visit
Admission: RE | Admit: 2020-12-10 | Discharge: 2020-12-10 | Disposition: A | Discharge status: Home / Self Care | Payer: BC Managed Care – PPO | Source: Ambulatory Visit | Attending: Nurse Practitioner | Admitting: Nurse Practitioner

## 2020-12-10 DIAGNOSIS — Z1231 Encounter for screening mammogram for malignant neoplasm of breast: Secondary | ICD-10-CM | POA: Diagnosis not present

## 2020-12-24 ENCOUNTER — Other Ambulatory Visit: Payer: BC Managed Care – PPO

## 2020-12-24 ENCOUNTER — Other Ambulatory Visit: Payer: Self-pay | Admitting: Internal Medicine

## 2020-12-24 DIAGNOSIS — Z20822 Contact with and (suspected) exposure to covid-19: Secondary | ICD-10-CM | POA: Diagnosis not present

## 2020-12-26 LAB — NOVEL CORONAVIRUS, NAA

## 2020-12-26 LAB — SPECIMEN STATUS REPORT

## 2020-12-31 DIAGNOSIS — S338XXA Sprain of other parts of lumbar spine and pelvis, initial encounter: Secondary | ICD-10-CM | POA: Diagnosis not present

## 2020-12-31 DIAGNOSIS — S233XXA Sprain of ligaments of thoracic spine, initial encounter: Secondary | ICD-10-CM | POA: Diagnosis not present

## 2020-12-31 DIAGNOSIS — S134XXA Sprain of ligaments of cervical spine, initial encounter: Secondary | ICD-10-CM | POA: Diagnosis not present

## 2021-01-02 DIAGNOSIS — S134XXA Sprain of ligaments of cervical spine, initial encounter: Secondary | ICD-10-CM | POA: Diagnosis not present

## 2021-01-02 DIAGNOSIS — S338XXA Sprain of other parts of lumbar spine and pelvis, initial encounter: Secondary | ICD-10-CM | POA: Diagnosis not present

## 2021-01-02 DIAGNOSIS — S233XXA Sprain of ligaments of thoracic spine, initial encounter: Secondary | ICD-10-CM | POA: Diagnosis not present

## 2021-01-06 DIAGNOSIS — S338XXA Sprain of other parts of lumbar spine and pelvis, initial encounter: Secondary | ICD-10-CM | POA: Diagnosis not present

## 2021-01-06 DIAGNOSIS — S134XXA Sprain of ligaments of cervical spine, initial encounter: Secondary | ICD-10-CM | POA: Diagnosis not present

## 2021-01-06 DIAGNOSIS — S233XXA Sprain of ligaments of thoracic spine, initial encounter: Secondary | ICD-10-CM | POA: Diagnosis not present

## 2021-01-14 DIAGNOSIS — S338XXA Sprain of other parts of lumbar spine and pelvis, initial encounter: Secondary | ICD-10-CM | POA: Diagnosis not present

## 2021-01-14 DIAGNOSIS — S73101A Unspecified sprain of right hip, initial encounter: Secondary | ICD-10-CM | POA: Diagnosis not present

## 2021-01-21 DIAGNOSIS — S73101A Unspecified sprain of right hip, initial encounter: Secondary | ICD-10-CM | POA: Diagnosis not present

## 2021-01-21 DIAGNOSIS — S338XXA Sprain of other parts of lumbar spine and pelvis, initial encounter: Secondary | ICD-10-CM | POA: Diagnosis not present

## 2021-01-25 ENCOUNTER — Other Ambulatory Visit: Payer: Self-pay | Admitting: Nurse Practitioner

## 2021-01-25 DIAGNOSIS — I10 Essential (primary) hypertension: Secondary | ICD-10-CM

## 2021-02-04 DIAGNOSIS — S73101A Unspecified sprain of right hip, initial encounter: Secondary | ICD-10-CM | POA: Diagnosis not present

## 2021-02-04 DIAGNOSIS — S338XXA Sprain of other parts of lumbar spine and pelvis, initial encounter: Secondary | ICD-10-CM | POA: Diagnosis not present

## 2021-02-09 ENCOUNTER — Encounter: Payer: Self-pay | Admitting: Nurse Practitioner

## 2021-02-09 ENCOUNTER — Ambulatory Visit (INDEPENDENT_AMBULATORY_CARE_PROVIDER_SITE_OTHER): Payer: BC Managed Care – PPO | Admitting: Nurse Practitioner

## 2021-02-09 ENCOUNTER — Other Ambulatory Visit: Payer: Self-pay

## 2021-02-09 VITALS — BP 126/83 | HR 75 | Temp 98.3°F | Resp 20 | Ht 64.0 in | Wt 281.0 lb

## 2021-02-09 DIAGNOSIS — E282 Polycystic ovarian syndrome: Secondary | ICD-10-CM

## 2021-02-09 DIAGNOSIS — G47411 Narcolepsy with cataplexy: Secondary | ICD-10-CM

## 2021-02-09 DIAGNOSIS — I10 Essential (primary) hypertension: Secondary | ICD-10-CM

## 2021-02-09 DIAGNOSIS — M797 Fibromyalgia: Secondary | ICD-10-CM

## 2021-02-09 DIAGNOSIS — G43719 Chronic migraine without aura, intractable, without status migrainosus: Secondary | ICD-10-CM

## 2021-02-09 DIAGNOSIS — F3342 Major depressive disorder, recurrent, in full remission: Secondary | ICD-10-CM

## 2021-02-09 DIAGNOSIS — Z6841 Body Mass Index (BMI) 40.0 and over, adult: Secondary | ICD-10-CM

## 2021-02-09 LAB — BAYER DCA HB A1C WAIVED: HB A1C (BAYER DCA - WAIVED): 5.6 % (ref <7.0)

## 2021-02-09 MED ORDER — LISINOPRIL 20 MG PO TABS: 20.0000 mg | ORAL_TABLET | Freq: Every day | ORAL | 1 refills | Status: DC

## 2021-02-09 MED ORDER — METFORMIN HCL ER 500 MG PO TB24: 500.0000 mg | ORAL_TABLET | Freq: Two times a day (BID) | ORAL | 1 refills | Status: DC

## 2021-02-09 NOTE — Patient Instructions (Signed)

## 2021-02-09 NOTE — Progress Notes (Signed)
Subjective:    Patient ID: Rachael Allen, female    DOB: 12/21/1970, 50 y.o.   MRN: [id number]    Chief Complaint: Follow up visit for chronic disease management.    HPI:  1. Intractable chronic migraine without aura and without status migrainosus No episodes recently. When she does get a migraine, she relaxes, takes NSAIDS prn if needed.   2. PCOS (polycystic ovarian syndrome) Managing well. Has IUD with no periods, no pelvic pain reported   3. Fibromyalgia muscle pain Takes Aleive daily. Takes 662m daily.  4. Recurrent major depressive disorder, in full remission (Rachael Allen Feeling well on latuda. Not having issues with depression at this.    [facility] 02/09/2021 08/08/2020 04/07/2020 12/18/2019 06/14/2019  Decreased Interest 0 0 0 0 0  Down, Depressed, Hopeless 0 0 0 0 0  PHQ - 2 Score 0 0 0 0 0  Altered sleeping 0 0 - - -  Tired, decreased energy 0 0 - - -  Change in appetite 0 0 - - -  Feeling bad or failure about yourself  0 0 - - -  Trouble concentrating 0 0 - - -  Moving slowly or fidgety/restless 0 0 - - -  Suicidal thoughts 0 0 - - -  PHQ-9 Score 0 0 - - -  Difficult doing work/chores Not difficult at all Not difficult at all - - -  Some recent data might be hidden    6. Primary narcolepsy with cataplexy No episodes recently. Not managing with medications  7. Primary hypertension Taking lisinopril. No c/o cough.   8. Class 3 severe obesity due to excess calories without serious comorbidity with body mass index (BMI) of 50.0 to 59.9 in adult ([facility] Has been intentionally loosing weigh. Watching her carbohydrate intake.   Wt Readings from Last 3 Encounters:  02/09/21 281 lb (127.5 kg)  08/08/20 (!) 301 lb (136.5 kg)  04/07/20 299 lb (135.6 kg)     Outpatient Encounter Medications as of 02/09/2021  Medication Sig  . [facility] B-12 500 MCG tablet Take 500 mcg by mouth daily.  .Marland Kitchenlisinopril (ZESTRIL) 20 MG tablet TAKE 1 TABLET BY MOUTH EVERY DAY  .  lurasidone (LATUDA) 40 MG TABS tablet Take 1 tablet (40 mg total) by mouth daily with breakfast. (Patient taking differently: Take 40 mg by mouth daily. )  . metFORMIN (GLUCOPHAGE-XR) 500 MG 24 hr tablet Take 1 tablet (500 mg total) by mouth 2 (two) times daily.  . methocarbamol (ROBAXIN) 500 MG tablet Take 1 tablet (500 mg total) by mouth every 8 (eight) hours as needed for muscle spasms.  . predniSONE (DELTASONE) 20 MG tablet 2 po at sametime daily for 5 days  . valACYclovir (VALTREX) 1000 MG tablet TAKE 2 TABLETS TWICE A DAY BY MOUTH FOR 1 DAY THEN AS NEEDED FOR FEVER BLISTERS   No facility-administered encounter medications on file as of 02/09/2021.    Past Surgical History:  Procedure Laterality Date  . CESAREAN SECTION    . TONSILLECTOMY AND ADENOIDECTOMY      Family History  Problem Relation Age of Onset  . Hypertension Mother   . Mental illness Father   . Hypertension Sister     New complaints: Right hip pain; Comes and goes in intensity, takes [place] the chiropractor. Both help but hasnt seen improvement.   Social history: Lives with husband  Controlled substance contract: N/A    Review of Systems  Constitutional: Negative for activity  change, appetite change, fever and unexpected weight change.  Respiratory: Negative for cough, shortness of breath and wheezing.   Cardiovascular: Negative for chest pain and palpitations.  Gastrointestinal: Negative for abdominal distention, abdominal pain, constipation and diarrhea.  Genitourinary: Negative for difficulty urinating, dysuria and hematuria.  Musculoskeletal: Positive for myalgias. Negative for back pain.  Neurological: Negative for dizziness, syncope, weakness, numbness and headaches.  Psychiatric/Behavioral: Negative for agitation. The patient is not nervous/anxious.        Objective:   Physical Exam Constitutional:      Appearance: Normal appearance. She is obese.  Eyes:     Extraocular Movements:  Extraocular movements intact.  Cardiovascular:     Rate and Rhythm: Normal rate and regular rhythm.     Pulses: Normal pulses.     Heart sounds: Normal heart sounds.  Pulmonary:     Effort: Pulmonary effort is normal.     Breath sounds: Normal breath sounds.  Abdominal:     General: Bowel sounds are normal.     Palpations: Abdomen is soft.  Musculoskeletal:        General: Normal range of motion.     Cervical back: Normal range of motion and neck supple. No tenderness.  Lymphadenopathy:     Cervical: No cervical adenopathy.  Skin:    General: Skin is warm and dry.     Capillary Refill: Capillary refill takes less than 2 seconds.  Neurological:     Mental Status: She is alert and oriented to person, place, and time.  Psychiatric:        Mood and Affect: Mood normal.        Behavior: Behavior normal.        Thought Content: Thought content normal.        Judgment: Judgment normal.    Vitals:   02/09/21 1217  BP: 126/83  Pulse: 75  Resp: 20  Temp: 98.3 F (36.8 C)  SpO2: 96%    Today's A1c is 5.6%     Assessment & Plan:   Rachael Allen comes in today with chief complaint of Medical Management of Chronic Issues   Diagnosis and orders addressed:  1. Intractable chronic migraine without aura and without status migrainosus Continue to control with Nsaid's & rest.   2. PCOS (polycystic ovarian syndrome) Symptoms well controlled on metformin and IUD.   - Bayer DCA Hb A1c Waived - metFORMIN (GLUCOPHAGE-XR) 500 MG 24 hr tablet; Take 1 tablet (500 mg total) by mouth 2 (two) times daily.  Dispense: 180 tablet; Refill: 1  3. Fibromyalgia muscle pain Continue PRN pain control with Nsaid's  4. Recurrent major depressive disorder, in full remission (Rachael Allen) Continue Latuda  5. Essential hypertension Continue lisinopril, low sodium diet, continue weight loss.   - CBC with Differential/Platelet - CMP14+EGFR - Lipid panel - lisinopril (ZESTRIL) 20 MG tablet; Take 1  tablet (20 mg total) by mouth daily.  Dispense: 90 tablet; Refill: 1  6. Primary narcolepsy with cataplexy   7. Primary hypertension Continue lisinopril, low sodium diet, continue weight loss.   8. Class 3 severe obesity due to excess calories without serious comorbidity with body mass index (BMI) of 50.0 to 59.9 in adult [facility]) She has had a 20lb weight loss since his last visit. Continue low carb diet and exercise.    Labs pending Health Maintenance reviewed Diet and exercise encouraged  Follow up plan: 6 months   Dollene Primrose, RN, BSN, FNP-Student  Mary-Margaret Hassell Done, FNP

## 2021-02-10 LAB — CMP14+EGFR
ALT: 14 IU/L (ref 0–32)
AST: 9 IU/L (ref 0–40)
Albumin/Globulin Ratio: 1.7 (ref 1.2–2.2)
Albumin: 4.3 g/dL (ref 3.8–4.8)
Alkaline Phosphatase: 61 IU/L (ref 44–121)
BUN/Creatinine Ratio: 11 (ref 9–23)
BUN: 8 mg/dL (ref 6–24)
Bilirubin Total: 0.3 mg/dL (ref 0.0–1.2)
CO2: 23 mmol/L (ref 20–29)
Calcium: 9.5 mg/dL (ref 8.7–10.2)
Chloride: 103 mmol/L (ref 96–106)
Creatinine, Ser: 0.73 mg/dL (ref 0.57–1.00)
Globulin, Total: 2.5 g/dL (ref 1.5–4.5)
Glucose: 105 mg/dL — ABNORMAL HIGH (ref 65–99)
Potassium: 4.4 mmol/L (ref 3.5–5.2)
Sodium: 140 mmol/L (ref 134–144)
Total Protein: 6.8 g/dL (ref 6.0–8.5)
eGFR: 100 mL/min/{1.73_m2} (ref >59)

## 2021-02-10 LAB — LIPID PANEL
Chol/HDL Ratio: 4.4 ratio (ref 0.0–4.4)
Cholesterol, Total: 142 mg/dL (ref 100–199)
HDL: 32 mg/dL — ABNORMAL LOW (ref >39)
LDL Chol Calc (NIH): 85 mg/dL (ref 0–99)
Triglycerides: 140 mg/dL (ref 0–149)
VLDL Cholesterol Cal: 25 mg/dL (ref 5–40)

## 2021-02-10 LAB — CBC WITH DIFFERENTIAL/PLATELET
Basophils Absolute: 0.1 10*3/uL (ref 0.0–0.2)
Basos: 1 %
EOS (ABSOLUTE): 0.2 10*3/uL (ref 0.0–0.4)
Eos: 3 %
Hematocrit: 41.5 % (ref 34.0–46.6)
Hemoglobin: 13.9 g/dL (ref 11.1–15.9)
Immature Grans (Abs): 0.1 10*3/uL (ref 0.0–0.1)
Immature Granulocytes: 1 %
Lymphocytes Absolute: 2 10*3/uL (ref 0.7–3.1)
Lymphs: 25 %
MCH: 27.7 pg (ref 26.6–33.0)
MCHC: 33.5 g/dL (ref 31.5–35.7)
MCV: 83 fL (ref 79–97)
Monocytes Absolute: 0.5 10*3/uL (ref 0.1–0.9)
Monocytes: 7 %
Neutrophils Absolute: 5.2 10*3/uL (ref 1.4–7.0)
Neutrophils: 63 %
Platelets: 296 10*3/uL (ref 150–450)
RBC: 5.01 x10E6/uL (ref 3.77–5.28)
RDW: 14.7 % (ref 11.7–15.4)
WBC: 8.1 10*3/uL (ref 3.4–10.8)

## 2021-02-24 DIAGNOSIS — S73101A Unspecified sprain of right hip, initial encounter: Secondary | ICD-10-CM | POA: Diagnosis not present

## 2021-02-24 DIAGNOSIS — S338XXA Sprain of other parts of lumbar spine and pelvis, initial encounter: Secondary | ICD-10-CM | POA: Diagnosis not present

## 2021-02-27 DIAGNOSIS — F4310 Post-traumatic stress disorder, unspecified: Secondary | ICD-10-CM | POA: Diagnosis not present

## 2021-02-27 DIAGNOSIS — F3181 Bipolar II disorder: Secondary | ICD-10-CM | POA: Diagnosis not present

## 2021-02-27 DIAGNOSIS — G4730 Sleep apnea, unspecified: Secondary | ICD-10-CM | POA: Diagnosis not present

## 2021-03-12 DIAGNOSIS — S338XXA Sprain of other parts of lumbar spine and pelvis, initial encounter: Secondary | ICD-10-CM | POA: Diagnosis not present

## 2021-03-12 DIAGNOSIS — S73101A Unspecified sprain of right hip, initial encounter: Secondary | ICD-10-CM | POA: Diagnosis not present

## 2021-03-26 DIAGNOSIS — L821 Other seborrheic keratosis: Secondary | ICD-10-CM | POA: Diagnosis not present

## 2021-03-26 DIAGNOSIS — L814 Other melanin hyperpigmentation: Secondary | ICD-10-CM | POA: Diagnosis not present

## 2021-03-26 DIAGNOSIS — L905 Scar conditions and fibrosis of skin: Secondary | ICD-10-CM | POA: Diagnosis not present

## 2021-03-26 DIAGNOSIS — D225 Melanocytic nevi of trunk: Secondary | ICD-10-CM | POA: Diagnosis not present

## 2021-04-02 DIAGNOSIS — S73101A Unspecified sprain of right hip, initial encounter: Secondary | ICD-10-CM | POA: Diagnosis not present

## 2021-04-02 DIAGNOSIS — S338XXA Sprain of other parts of lumbar spine and pelvis, initial encounter: Secondary | ICD-10-CM | POA: Diagnosis not present

## 2021-04-23 ENCOUNTER — Encounter: Payer: Self-pay | Admitting: Nurse Practitioner

## 2021-04-23 ENCOUNTER — Other Ambulatory Visit: Payer: Self-pay

## 2021-04-23 ENCOUNTER — Ambulatory Visit (INDEPENDENT_AMBULATORY_CARE_PROVIDER_SITE_OTHER): Payer: BC Managed Care – PPO | Admitting: Nurse Practitioner

## 2021-04-23 VITALS — BP 117/75 | HR 71 | Temp 97.6°F | Resp 20 | Ht 64.0 in | Wt 286.0 lb

## 2021-04-23 DIAGNOSIS — M25551 Pain in right hip: Secondary | ICD-10-CM

## 2021-04-23 MED ORDER — METHYLPREDNISOLONE ACETATE 40 MG/ML IJ SUSP
80.0000 mg | Freq: Once | INTRAMUSCULAR | Status: AC
  Administered 2021-04-23: 80 mg via INTRAMUSCULAR

## 2021-04-23 MED ORDER — PREDNISONE 20 MG PO TABS: 40.0000 mg | ORAL_TABLET | Freq: Every day | ORAL | 0 refills | Status: AC

## 2021-04-23 NOTE — Patient Instructions (Signed)
Hip Pain The hip is the joint between the upper legs and the lower pelvis. The bones, cartilage, tendons, and muscles of your hip joint support your body and allow you to move around. Hip pain can range from a minor ache to severe pain in one or both of your hips. The pain may be felt on the inside of the hip joint near the groin, or on the outside near the buttocks and upper thigh. You may also have swelling or stiffness in your hip area. Follow these instructions at home: Managing pain, stiffness, and swelling  If directed, put ice on the painful area. To do this: ? Put ice in a plastic bag. ? Place a towel between your skin and the bag. ? Leave the ice on for 20 minutes, 2-3 times a day.  If directed, apply heat to the affected area as often as told by your health care provider. Use the heat source that your health care provider recommends, such as a moist heat pack or a heating pad. ? Place a towel between your skin and the heat source. ? Leave the heat on for 20-30 minutes. ? Remove the heat if your skin turns bright red. This is especially important if you are unable to feel pain, heat, or cold. You may have a greater risk of getting burned.      Activity  Do exercises as told by your health care provider.  Avoid activities that cause pain. General instructions  Take over-the-counter and prescription medicines only as told by your health care provider.  Keep a journal of your symptoms. Write down: ? How often you have hip pain. ? The location of your pain. ? What the pain feels like. ? What makes the pain worse.  Sleep with a pillow between your legs on your most comfortable side.  Keep all follow-up visits as told by your health care provider. This is important.   Contact a health care provider if:  You cannot put weight on your leg.  Your pain or swelling continues or gets worse after one week.  It gets harder to walk.  You have a fever. Get help right away  if:  You fall.  You have a sudden increase in pain and swelling in your hip.  Your hip is red or swollen or very tender to touch. Summary  Hip pain can range from a minor ache to severe pain in one or both of your hips.  The pain may be felt on the inside of the hip joint near the groin, or on the outside near the buttocks and upper thigh.  Avoid activities that cause pain.  Write down how often you have hip pain, the location of the pain, what makes it worse, and what it feels like. This information is not intended to replace advice given to you by your health care provider. Make sure you discuss any questions you have with your health care provider. Document Revised: 04/16/2019 Document Reviewed: 04/16/2019 Elsevier Patient Education  2021 Elsevier Inc.  

## 2021-04-23 NOTE — Progress Notes (Signed)
   Subjective:    Patient ID: [name], female    DOB: 02/25/1971, 50 y.o.   MRN: [id number]   Chief Complaint: Hip Pain (Right/)   HPI Patient comes in c/o right hip pain. Started 5 months ago. She has been going to chiropractor which has not been helping. Aleve and hip use to help but now nothing is helping. Rates pain 7/10 currently. Throbbing pan. Worse when sitting or riding in car. Standing helps relieve pain.   Review of Systems  Constitutional: Negative for diaphoresis.  Eyes: Negative for pain.  Respiratory: Negative for shortness of breath.   Cardiovascular: Negative for chest pain, palpitations and leg swelling.  Gastrointestinal: Negative for abdominal pain.  Endocrine: Negative for polydipsia.  Skin: Negative for rash.  Neurological: Negative for dizziness, weakness and headaches.  Hematological: Does not bruise/bleed easily.  All other systems reviewed and are negative.      Objective:   Physical Exam Vitals and nursing note reviewed.  Constitutional:      Appearance: Normal appearance.  Cardiovascular:     Rate and Rhythm: Normal rate and regular rhythm.     Heart sounds: Normal heart sounds.  Pulmonary:     Breath sounds: Normal breath sounds.  Musculoskeletal:     Comments: FROM of right hip without pain. Pain on palpation of right posterior hip.  Motor strength and sensation distally intact.  Neurological:     Mental Status: She is alert.    BP 117/75   Pulse 71   Temp 97.6 F (36.4 C) (Temporal)   Resp 20   Ht 5\' 4"  (1.626 m)   Wt 286 lb (129.7 kg)   SpO2 97%   BMI 49.09 kg/m        Assessment & Plan:  [name] in today with chief complaint of Hip Pain (Right/)   1. Right hip pain tylenol as needed No NSAIDS while on steroids Moist heat rest - methylPREDNISolone acetate (DEPO-MEDROL) injection 80 mg - predniSONE (DELTASONE) 20 MG tablet; Take 2 tablets (40 mg total) by mouth daily with breakfast for 5 days. 2 po daily  for 5 days  Dispense: 10 tablet; Refill: 0    The above assessment and management plan was discussed with the patient. The patient verbalized understanding of and has agreed to the management plan. Patient is aware to call the clinic if symptoms persist or worsen. Patient is aware when to return to the clinic for a follow-up visit. Patient educated on when it is appropriate to go to the emergency department.   Mary-Margaret Hassell Done, FNP

## 2021-05-15 ENCOUNTER — Ambulatory Visit: Payer: BC Managed Care – PPO | Admitting: Nurse Practitioner

## 2021-05-22 ENCOUNTER — Other Ambulatory Visit: Payer: Self-pay

## 2021-05-22 ENCOUNTER — Ambulatory Visit (INDEPENDENT_AMBULATORY_CARE_PROVIDER_SITE_OTHER): Payer: BC Managed Care – PPO | Admitting: Nurse Practitioner

## 2021-05-22 ENCOUNTER — Encounter: Payer: Self-pay | Admitting: Nurse Practitioner

## 2021-05-22 VITALS — BP 141/88 | HR 62 | Temp 98.2°F | Resp 20 | Ht 64.0 in | Wt 286.0 lb

## 2021-05-22 DIAGNOSIS — M25551 Pain in right hip: Secondary | ICD-10-CM | POA: Diagnosis not present

## 2021-05-22 MED ORDER — CYCLOBENZAPRINE HCL 10 MG PO TABS: 10.0000 mg | ORAL_TABLET | Freq: Three times a day (TID) | ORAL | 1 refills | Status: DC | PRN

## 2021-05-22 MED ORDER — KETOROLAC TROMETHAMINE 60 MG/2ML IM SOLN
60.0000 mg | Freq: Once | INTRAMUSCULAR | Status: AC
  Administered 2021-05-22: 13:00:00 60 mg via INTRAMUSCULAR

## 2021-05-22 NOTE — Patient Instructions (Signed)
Hip Pain The hip is the joint between the upper legs and the lower pelvis. The bones, cartilage, tendons, and muscles of your hip joint support your body and allow you to move around. Hip pain can range from a minor ache to severe pain in one or both of your hips. The pain may be felt on the inside of the hip joint near the groin, or on the outside near the buttocks and upper thigh. You may also have swelling or stiffness in your hip area. Follow these instructions at home: Managing pain, stiffness, and swelling   If directed, put ice on the painful area. To do this: Put ice in a plastic bag. Place a towel between your skin and the bag. Leave the ice on for 20 minutes, 2-3 times a day. If directed, apply heat to the affected area as often as told by your health care provider. Use the heat source that your health care provider recommends, such as a moist heat pack or a heating pad. Place a towel between your skin and the heat source. Leave the heat on for 20-30 minutes. Remove the heat if your skin turns bright red. This is especially important if you are unable to feel pain, heat, or cold. You may have a greater risk of getting burned. Activity Do exercises as told by your health care provider. Avoid activities that cause pain. General instructions  Take over-the-counter and prescription medicines only as told by your health care provider. Keep a journal of your symptoms. Write down: How often you have hip pain. The location of your pain. What the pain feels like. What makes the pain worse. Sleep with a pillow between your legs on your most comfortable side. Keep all follow-up visits as told by your health care provider. This is important. Contact a health care provider if: You cannot put weight on your leg. Your pain or swelling continues or gets worse after one week. It gets harder to walk. You have a fever. Get help right away if: You fall. You have a sudden increase in pain and  swelling in your hip. Your hip is red or swollen or very tender to touch. Summary Hip pain can range from a minor ache to severe pain in one or both of your hips. The pain may be felt on the inside of the hip joint near the groin, or on the outside near the buttocks and upper thigh. Avoid activities that cause pain. Write down how often you have hip pain, the location of the pain, what makes it worse, and what it feels like. This information is not intended to replace advice given to you by your health care provider. Make sure you discuss any questions you have with your health care provider. Document Revised: 04/16/2019 Document Reviewed: 04/16/2019 Elsevier Patient Education  2022 Elsevier Inc.  

## 2021-05-22 NOTE — Progress Notes (Signed)
   Subjective:    Patient ID: Rachael [place], female    DOB: 07-20-1971, 50 y.o.   MRN: [id number]   Chief Complaint: Hip Pain (Right)   HPI Patient comes in today c/o right hip pain. She was seen on 04/23/21 with the same complaint. She was given a depomedrol shot and a steroid dose pack. She says that really did not help that much. Area that helps feels muscular. No pain with walking. Only hurts when sitting. Rates pain 7/10 currently. Walking and standing actually decrease pain.     Review of Systems  Constitutional:  Negative for diaphoresis.  Eyes:  Negative for pain.  Respiratory:  Negative for shortness of breath.   Cardiovascular:  Negative for chest pain, palpitations and leg swelling.  Gastrointestinal:  Negative for abdominal pain.  Endocrine: Negative for polydipsia.  Skin:  Negative for rash.  Neurological:  Negative for dizziness, weakness and headaches.  Hematological:  Does not bruise/bleed easily.  All other systems reviewed and are negative.     Objective:   Physical Exam Vitals and nursing note reviewed.  Constitutional:      Appearance: Normal appearance.  Cardiovascular:     Rate and Rhythm: Normal rate and regular rhythm.     Heart sounds: Normal heart sounds.  Pulmonary:     Breath sounds: Normal breath sounds.  Musculoskeletal:     Comments: ROM of right hip without pain  Skin:    General: Skin is warm.  Neurological:     General: No focal deficit present.     Mental Status: She is alert and oriented to person, place, and time.  Psychiatric:        Mood and Affect: Mood normal.        Behavior: Behavior normal.    BP (!) 141/88   Pulse 62   Temp 98.2 F (36.8 C) (Temporal)   Resp 20   Ht 5\' 4"  (1.626 m)   Wt 286 lb (129.7 kg)   SpO2 96%   BMI 49.09 kg/m        Assessment & Plan:  Rachael Allen in today with chief complaint of Hip Pain (Right)   1. Right hip pain Moist heat Rest Will do MRI if this doesn't help -  cyclobenzaprine (FLEXERIL) 10 MG tablet; Take 1 tablet (10 mg total) by mouth 3 (three) times daily as needed for muscle spasms.  Dispense: 30 tablet; Refill: 1 - ketorolac (TORADOL) injection 60 mg    The above assessment and management plan was discussed with the patient. The patient verbalized understanding of and has agreed to the management plan. Patient is aware to call the clinic if symptoms persist or worsen. Patient is aware when to return to the clinic for a follow-up visit. Patient educated on when it is appropriate to go to the emergency department.   Mary-Margaret Hassell Done, FNP

## 2021-06-01 ENCOUNTER — Telehealth: Payer: Self-pay | Admitting: Nurse Practitioner

## 2021-06-01 NOTE — Telephone Encounter (Signed)
Pt called stating that her hip pain is back and wants to know if MMM can give her another shot like she recently did for her? Wants to know if MMM can see her tomorrow before she goes on vacation.  Please advise and call patient.

## 2021-06-02 ENCOUNTER — Other Ambulatory Visit: Payer: Self-pay

## 2021-06-02 ENCOUNTER — Ambulatory Visit (INDEPENDENT_AMBULATORY_CARE_PROVIDER_SITE_OTHER): Payer: BC Managed Care – PPO | Admitting: *Deleted

## 2021-06-02 DIAGNOSIS — M25551 Pain in right hip: Secondary | ICD-10-CM | POA: Diagnosis not present

## 2021-06-02 MED ORDER — KETOROLAC TROMETHAMINE 60 MG/2ML IM SOLN
60.0000 mg | Freq: Once | INTRAMUSCULAR | Status: AC
  Administered 2021-06-02: 60 mg via INTRAMUSCULAR

## 2021-06-02 NOTE — Telephone Encounter (Signed)
Pt is on the triage schedule today at 3:30pm

## 2021-08-10 ENCOUNTER — Other Ambulatory Visit: Payer: Self-pay

## 2021-08-10 ENCOUNTER — Encounter: Payer: Self-pay | Admitting: Nurse Practitioner

## 2021-08-10 ENCOUNTER — Ambulatory Visit (INDEPENDENT_AMBULATORY_CARE_PROVIDER_SITE_OTHER): Payer: BC Managed Care – PPO | Admitting: Nurse Practitioner

## 2021-08-10 VITALS — BP 119/77 | HR 70 | Temp 97.7°F | Resp 20 | Ht 64.0 in | Wt 292.0 lb

## 2021-08-10 DIAGNOSIS — M25551 Pain in right hip: Secondary | ICD-10-CM

## 2021-08-10 DIAGNOSIS — G47411 Narcolepsy with cataplexy: Secondary | ICD-10-CM

## 2021-08-10 DIAGNOSIS — E119 Type 2 diabetes mellitus without complications: Secondary | ICD-10-CM | POA: Diagnosis not present

## 2021-08-10 DIAGNOSIS — K5790 Diverticulosis of intestine, part unspecified, without perforation or abscess without bleeding: Secondary | ICD-10-CM | POA: Diagnosis not present

## 2021-08-10 DIAGNOSIS — G43719 Chronic migraine without aura, intractable, without status migrainosus: Secondary | ICD-10-CM | POA: Diagnosis not present

## 2021-08-10 DIAGNOSIS — I10 Essential (primary) hypertension: Secondary | ICD-10-CM | POA: Diagnosis not present

## 2021-08-10 DIAGNOSIS — F3160 Bipolar disorder, current episode mixed, unspecified: Secondary | ICD-10-CM

## 2021-08-10 DIAGNOSIS — F3342 Major depressive disorder, recurrent, in full remission: Secondary | ICD-10-CM

## 2021-08-10 DIAGNOSIS — E1169 Type 2 diabetes mellitus with other specified complication: Secondary | ICD-10-CM | POA: Insufficient documentation

## 2021-08-10 DIAGNOSIS — M797 Fibromyalgia: Secondary | ICD-10-CM

## 2021-08-10 DIAGNOSIS — E282 Polycystic ovarian syndrome: Secondary | ICD-10-CM

## 2021-08-10 LAB — BAYER DCA HB A1C WAIVED: HB A1C (BAYER DCA - WAIVED): 5 % (ref <7.0)

## 2021-08-10 MED ORDER — LISINOPRIL 20 MG PO TABS: 20.0000 mg | ORAL_TABLET | Freq: Every day | ORAL | 1 refills | Status: DC

## 2021-08-10 MED ORDER — METFORMIN HCL ER 500 MG PO TB24: 500.0000 mg | ORAL_TABLET | Freq: Two times a day (BID) | ORAL | 1 refills | Status: DC

## 2021-08-10 NOTE — Progress Notes (Signed)
Subjective:    Patient ID: Rachael [place], female    DOB: [birth year], 50 y.o.   MRN: [id number]   Chief Complaint: Medical Management of Chronic Issues    HPI:  1. Primary hypertension No c/o chest pain, sob or headache. Does not check blood pressure at home. BP Readings from Last 3 Encounters:  08/10/21 119/77  05/22/21 (!) 141/88  04/23/21 117/75     2. PCO's Is doing well. Does not watch blood sugars but HGBA1C has been normal. Lab Results  Component Value Date   HGBA1C 5.6 02/09/2021     3. Intractable chronic migraine without aura and without status migrainosus Very seldom has migraines anymore.   4. Diverticulosis No recent flare up. Watches diet to prevent flare up.  5. Bipolar affective disorder, current episode mixed, current episode severity unspecified ([place]) Is on latuda. Deneis any more swings. No side effects from medication. Depression screen [facility] 2/9 08/10/2021 04/23/2021 02/09/2021  Decreased Interest 0 0 0  Down, Depressed, Hopeless 0 0 0  PHQ - 2 Score 0 0 0  Altered sleeping 0 - 0  Tired, decreased energy 1 - 0  Change in appetite 0 - 0  Feeling bad or failure about yourself  0 - 0  Trouble concentrating 0 - 0  Moving slowly or fidgety/restless 0 - 0  Suicidal thoughts 0 - 0  PHQ-9 Score 1 - 0  Difficult doing work/chores Not difficult at all - Not difficult at all  Some recent data might be hidden   GAD 7 : Generalized Anxiety Score 08/10/2021 04/07/2020 01/26/2016  Nervous, Anxious, on Edge 0 0 3  Control/stop worrying 0 0 3  Worry too much - different things 1 0 2  Trouble relaxing 0 0 3  Restless 0 0 2  Easily annoyed or irritable 0 0 1  Afraid - awful might happen 0 0 1  Total GAD 7 Score 1 0 15  Anxiety Difficulty Not difficult at all Not difficult at all Extremely difficult      6. Recurrent major depressive disorder, in full remission ([place]) Again is on latuda. See phq results above.  7. Fibromyalgia muscle pain Is on flexeril  which helps . Bothers her daily but is manageable with aleve OTC.  8. Primary narcolepsy with cataplexy Not having any recent issues  9. Morbid obesity ([place]) Weighht is up 6 lbs. Wt Readings from Last 3 Encounters:  08/10/21 292 lb (132.5 kg)  05/22/21 286 lb (129.7 kg)  04/23/21 286 lb (129.7 kg)   BMI Readings from Last 3 Encounters:  08/10/21 50.12 kg/m  05/22/21 49.09 kg/m  04/23/21 49.09 kg/m      Outpatient Encounter Medications as of 08/10/2021  Medication Sig   [facility] B-12 500 MCG tablet Take 500 mcg by mouth daily.   cyclobenzaprine (FLEXERIL) 10 MG tablet Take 1 tablet (10 mg total) by mouth 3 (three) times daily as needed for muscle spasms.   lisinopril (ZESTRIL) 20 MG tablet Take 1 tablet (20 mg total) by mouth daily.   lurasidone (LATUDA) 40 MG TABS tablet Take 1 tablet (40 mg total) by mouth daily with breakfast. (Patient taking differently: Take 40 mg by mouth daily.)   metFORMIN (GLUCOPHAGE-XR) 500 MG 24 hr tablet Take 1 tablet (500 mg total) by mouth 2 (two) times daily.   valACYclovir (VALTREX) 1000 MG tablet TAKE 2 TABLETS TWICE A DAY BY MOUTH FOR 1 DAY THEN AS NEEDED FOR FEVER BLISTERS   No facility-administered encounter medications  on file as of 08/10/2021.    Past Surgical History:  Procedure Laterality Date   CESAREAN SECTION     TONSILLECTOMY AND ADENOIDECTOMY      Family History  Problem Relation Age of Onset   Hypertension Mother    Mental illness Father    Hypertension Sister     New complaints: Still having right hip pain. Steroid injections did not help. Needs referral. Feels like nagging throbing tooth ache all the time. Movement doe snot change pain . Seating or laying in certain positions increases pain.  Social history: Lives with her husband  Controlled substance contract: n/a     Review of Systems  Constitutional:  Negative for diaphoresis.  Eyes:  Negative for pain.  Respiratory:  Negative for shortness of breath.    Cardiovascular:  Negative for chest pain, palpitations and leg swelling.  Gastrointestinal:  Negative for abdominal pain.  Endocrine: Negative for polydipsia.  Skin:  Negative for rash.  Neurological:  Negative for dizziness, weakness and headaches.  Hematological:  Does not bruise/bleed easily.  All other systems reviewed and are negative.     Objective:   Physical Exam Vitals and nursing note reviewed.  Constitutional:      General: She is not in acute distress.    Appearance: Normal appearance. She is well-developed.  HENT:     Head: Normocephalic.     Right Ear: Tympanic membrane normal.     Left Ear: Tympanic membrane normal.     Nose: Nose normal.     Mouth/Throat:     Mouth: Mucous membranes are moist.  Eyes:     Pupils: Pupils are equal, round, and reactive to light.  Neck:     Vascular: No carotid bruit or JVD.  Cardiovascular:     Rate and Rhythm: Normal rate and regular rhythm.     Heart sounds: Normal heart sounds.  Pulmonary:     Effort: Pulmonary effort is normal. No respiratory distress.     Breath sounds: Normal breath sounds. No wheezing or rales.  Chest:     Chest wall: No tenderness.  Abdominal:     General: Bowel sounds are normal. There is no distension or abdominal bruit.     Palpations: Abdomen is soft. There is no hepatomegaly, splenomegaly, mass or pulsatile mass.     Tenderness: There is no abdominal tenderness.  Musculoskeletal:        General: Normal range of motion.     Cervical back: Normal range of motion and neck supple.     Comments: FROM of right hip with increase in pain on internal and external rotation  Lymphadenopathy:     Cervical: No cervical adenopathy.  Skin:    General: Skin is warm and dry.  Neurological:     Mental Status: She is alert and oriented to person, place, and time.     Deep Tendon Reflexes: Reflexes are normal and symmetric.  Psychiatric:        Behavior: Behavior normal.        Thought Content: Thought  content normal.        Judgment: Judgment normal.   BP 119/77   Pulse 70   Temp 97.7 F (36.5 C) (Temporal)   Resp 20   Ht _0  (1.626 m)   Wt 292 lb (132.5 kg)   SpO2 98%   BMI 50.12 kg/m   HGBA1c 5.4      Assessment & Plan:  SATORI KRABILL comes in today with chief  complaint of Medical Management of Chronic Issues   Diagnosis and orders addressed:  1. Primary hypertension Low sodium diet - CBC with Differential/Platelet - CMP14+EGFR - Lipid panel - lisinopril (ZESTRIL) 20 MG tablet; Take 1 tablet (20 mg total) by mouth daily.  Dispense: 90 tablet; Refill: 1  2. Intractable chronic migraine without aura and without status migrainosus Keep diary if migraines reoccur  3. Diverticulosis Continue  to watch diet to prevent flare up  4. Bipolar affective disorder, current episode mixed, current episode severity unspecified ([place]) Continue psych follow up and cntinue latuda daily.  5. Recurrent major depressive disorder, in full remission ([place])  6. Fibromyalgia muscle pain Continue exercise to keep muscles warm  7. Primary narcolepsy with cataplexy Not an issue as of late.  8. Morbid obesity ([place]) Discussed diet and exercise for person with BMI >25 Will recheck weight in 3-6 months   9. PCOS (polycystic ovarian syndrome) - Bayer DCA Hb A1c Waived - metFORMIN (GLUCOPHAGE-XR) 500 MG 24 hr tablet; Take 1 tablet (500 mg total) by mouth 2 (two) times daily.  Dispense: 180 tablet; Refill: 1  10. Right hip pain Referral to ortho - Ambulatory referral to Orthopedic Surgery   Labs pending Health Maintenance reviewed Diet and exercise encouraged  Follow up plan: 6 months   Mary-Margaret Hassell Done, FNP

## 2021-08-10 NOTE — Patient Instructions (Signed)
Hip Pain The hip is the joint between the upper legs and the lower pelvis. The bones, cartilage, tendons, and muscles of your hip joint support your body and allow you to move around. Hip pain can range from a minor ache to severe pain in one or both of your hips. The pain may be felt on the inside of the hip joint near the groin, or on the outside near the buttocks and upper thigh. You may also have swelling or stiffness in your hip area. Follow these instructions at home: Managing pain, stiffness, and swelling   If directed, put ice on the painful area. To do this: Put ice in a plastic bag. Place a towel between your skin and the bag. Leave the ice on for 20 minutes, 2-3 times a day. If directed, apply heat to the affected area as often as told by your health care provider. Use the heat source that your health care provider recommends, such as a moist heat pack or a heating pad. Place a towel between your skin and the heat source. Leave the heat on for 20-30 minutes. Remove the heat if your skin turns bright red. This is especially important if you are unable to feel pain, heat, or cold. You may have a greater risk of getting burned. Activity Do exercises as told by your health care provider. Avoid activities that cause pain. General instructions  Take over-the-counter and prescription medicines only as told by your health care provider. Keep a journal of your symptoms. Write down: How often you have hip pain. The location of your pain. What the pain feels like. What makes the pain worse. Sleep with a pillow between your legs on your most comfortable side. Keep all follow-up visits as told by your health care provider. This is important. Contact a health care provider if: You cannot put weight on your leg. Your pain or swelling continues or gets worse after one week. It gets harder to walk. You have a fever. Get help right away if: You fall. You have a sudden increase in pain and  swelling in your hip. Your hip is red or swollen or very tender to touch. Summary Hip pain can range from a minor ache to severe pain in one or both of your hips. The pain may be felt on the inside of the hip joint near the groin, or on the outside near the buttocks and upper thigh. Avoid activities that cause pain. Write down how often you have hip pain, the location of the pain, what makes it worse, and what it feels like. This information is not intended to replace advice given to you by your health care provider. Make sure you discuss any questions you have with your health care provider. Document Revised: 04/16/2019 Document Reviewed: 04/16/2019 Elsevier Patient Education  2022 Elsevier Inc.  

## 2021-08-11 LAB — CMP14+EGFR
ALT: 15 IU/L (ref 0–32)
AST: 12 IU/L (ref 0–40)
Albumin/Globulin Ratio: 1.8 (ref 1.2–2.2)
Albumin: 4.4 g/dL (ref 3.8–4.8)
Alkaline Phosphatase: 58 IU/L (ref 44–121)
BUN/Creatinine Ratio: 12 (ref 9–23)
BUN: 9 mg/dL (ref 6–24)
Bilirubin Total: 0.3 mg/dL (ref 0.0–1.2)
CO2: 24 mmol/L (ref 20–29)
Calcium: 9.7 mg/dL (ref 8.7–10.2)
Chloride: 104 mmol/L (ref 96–106)
Creatinine, Ser: 0.75 mg/dL (ref 0.57–1.00)
Globulin, Total: 2.4 g/dL (ref 1.5–4.5)
Glucose: 156 mg/dL — ABNORMAL HIGH (ref 65–99)
Potassium: 4.4 mmol/L (ref 3.5–5.2)
Sodium: 141 mmol/L (ref 134–144)
Total Protein: 6.8 g/dL (ref 6.0–8.5)
eGFR: 97 mL/min/{1.73_m2} (ref >59)

## 2021-08-11 LAB — LIPID PANEL
Chol/HDL Ratio: 4.2 ratio (ref 0.0–4.4)
Cholesterol, Total: 143 mg/dL (ref 100–199)
HDL: 34 mg/dL — ABNORMAL LOW (ref >39)
LDL Chol Calc (NIH): 81 mg/dL (ref 0–99)
Triglycerides: 162 mg/dL — ABNORMAL HIGH (ref 0–149)
VLDL Cholesterol Cal: 28 mg/dL (ref 5–40)

## 2021-08-11 LAB — CBC WITH DIFFERENTIAL/PLATELET
Basophils Absolute: 0.1 10*3/uL (ref 0.0–0.2)
Basos: 1 %
EOS (ABSOLUTE): 0.2 10*3/uL (ref 0.0–0.4)
Eos: 2 %
Hematocrit: 41.4 % (ref 34.0–46.6)
Hemoglobin: 13.9 g/dL (ref 11.1–15.9)
Immature Grans (Abs): 0.1 10*3/uL (ref 0.0–0.1)
Immature Granulocytes: 1 %
Lymphocytes Absolute: 2.4 10*3/uL (ref 0.7–3.1)
Lymphs: 27 %
MCH: 28.3 pg (ref 26.6–33.0)
MCHC: 33.6 g/dL (ref 31.5–35.7)
MCV: 84 fL (ref 79–97)
Monocytes Absolute: 0.3 10*3/uL (ref 0.1–0.9)
Monocytes: 3 %
Neutrophils Absolute: 5.8 10*3/uL (ref 1.4–7.0)
Neutrophils: 66 %
Platelets: 298 10*3/uL (ref 150–450)
RBC: 4.91 x10E6/uL (ref 3.77–5.28)
RDW: 14 % (ref 11.7–15.4)
WBC: 8.7 10*3/uL (ref 3.4–10.8)

## 2021-08-25 DIAGNOSIS — M25551 Pain in right hip: Secondary | ICD-10-CM | POA: Insufficient documentation

## 2021-08-27 DIAGNOSIS — M25551 Pain in right hip: Secondary | ICD-10-CM | POA: Diagnosis not present

## 2021-09-09 DIAGNOSIS — M25551 Pain in right hip: Secondary | ICD-10-CM | POA: Diagnosis not present

## 2021-10-27 ENCOUNTER — Other Ambulatory Visit: Payer: Self-pay | Admitting: Nurse Practitioner

## 2021-10-27 DIAGNOSIS — Z1231 Encounter for screening mammogram for malignant neoplasm of breast: Secondary | ICD-10-CM

## 2021-11-09 ENCOUNTER — Ambulatory Visit (INDEPENDENT_AMBULATORY_CARE_PROVIDER_SITE_OTHER): Payer: BC Managed Care – PPO | Admitting: Nurse Practitioner

## 2021-11-09 ENCOUNTER — Ambulatory Visit: Payer: BC Managed Care – PPO | Admitting: Nurse Practitioner

## 2021-11-09 ENCOUNTER — Encounter: Payer: Self-pay | Admitting: Nurse Practitioner

## 2021-11-09 VITALS — BP 141/92 | HR 118 | Temp 97.6°F | Ht 64.0 in | Wt 297.4 lb

## 2021-11-09 DIAGNOSIS — J029 Acute pharyngitis, unspecified: Secondary | ICD-10-CM | POA: Insufficient documentation

## 2021-11-09 DIAGNOSIS — R059 Cough, unspecified: Secondary | ICD-10-CM | POA: Diagnosis not present

## 2021-11-09 LAB — VERITOR FLU A/B WAIVED
Influenza A: POSITIVE — AB
Influenza B: NEGATIVE

## 2021-11-09 LAB — CULTURE, GROUP A STREP

## 2021-11-09 LAB — RAPID STREP SCREEN (MED CTR MEBANE ONLY): Strep Gp A Ag, IA W/Reflex: NEGATIVE

## 2021-11-09 MED ORDER — OSELTAMIVIR PHOSPHATE 75 MG PO CAPS: 75.0000 mg | ORAL_CAPSULE | Freq: Two times a day (BID) | ORAL | 0 refills | Status: DC

## 2021-11-09 NOTE — Assessment & Plan Note (Signed)
Take meds as prescribed - Use a cool mist humidifier  -Use saline nose sprays frequently -Force fluids -For fever or aches or pains- take Tylenol or ibuprofen. -covid-19, strep and flu swabs completed results pending Follow up with worsening unresolved symptoms  Ps   Positive for flu, will send Tamiflu RX to pharmacy.   Follow up with worsening unresolved symptoms

## 2021-11-09 NOTE — Assessment & Plan Note (Signed)
Take meds as prescribed - Use a cool mist humidifier  -Use saline nose sprays frequently -Force fluids -For fever or aches or pains- take Tylenol or ibuprofen. -covid-19, strep and flu swabs completed results pending Follow up with worsening unresolved symptoms

## 2021-11-09 NOTE — Patient Instructions (Signed)
Upper Respiratory Infection, Adult An upper respiratory infection (URI) affects the nose, throat, and upper airways that lead to the lungs. The most common type of URI is often called the common cold. URIs usually get better on their own, without medical treatment. What are the causes? A URI is caused by a germ (virus). You may catch these germs by: Breathing in droplets from an infected person's cough or sneeze. Touching something that has the germ on it (is contaminated) and then touching your mouth, nose, or eyes. What increases the risk? You are more likely to get a URI if: You are very young or very old. You have close contact with others, such as at work, school, or a health care facility. You smoke. You have long-term (chronic) heart or lung disease. You have a weakened disease-fighting system (immune system). You have nasal allergies or asthma. You have a lot of stress. You have poor nutrition. What are the signs or symptoms? Runny or stuffy (congested) nose. Cough. Sneezing. Sore throat. Headache. Feeling tired (fatigue). Fever. Not wanting to eat as much as usual. Pain in your forehead, behind your eyes, and over your cheekbones (sinus pain). Muscle aches. Redness or irritation of the eyes. Pressure in the ears or face. How is this treated? URIs usually get better on their own within 7-10 days. Medicines cannot cure URIs, but your doctor may recommend certain medicines to help relieve symptoms, such as: Over-the-counter cold medicines. Medicines to reduce coughing (cough suppressants). Coughing is a type of defense against infection that helps to clear the nose, throat, windpipe, and lungs (respiratory system). Take these medicines only as told by your doctor. Medicines to lower your fever. Follow these instructions at home: Activity Rest as needed. If you have a fever, stay home from work or school until your fever is gone, or until your doctor says you may return to  work or school. You should stay home until you cannot spread the infection anymore (you are not contagious). Your doctor may have you wear a face mask so you have less risk of spreading the infection. Relieving symptoms Rinse your mouth often with salt water. To make salt water, dissolve -1 tsp (3-6 g) of salt in 1 cup (237 mL) of warm water. Use a cool-mist humidifier to add moisture to the air. This can help you breathe more easily. Eating and drinking  Drink enough fluid to keep your pee (urine) pale yellow. Eat soups and other clear broths. General instructions  Take over-the-counter and prescription medicines only as told by your doctor. Do not smoke or use any products that contain nicotine or tobacco. If you need help quitting, ask your doctor. Avoid being where people are smoking (avoid secondhand smoke). Stay up to date on all your shots (immunizations), and get the flu shot every year. Keep all follow-up visits. How to prevent the spread of infection to others  Wash your hands with soap and water for at least 20 seconds. If you cannot use soap and water, use hand sanitizer. Avoid touching your mouth, face, eyes, or nose. Cough or sneeze into a tissue or your sleeve or elbow. Do not cough or sneeze into your hand or into the air. Contact a doctor if: You are getting worse, not better. You have any of these: A fever or chills. Brown or red mucus in your nose. Yellow or brown fluid (discharge)coming from your nose. Pain in your face, especially when you bend forward. Swollen neck glands. Pain when you swallow.   White areas in the back of your throat. Get help right away if: You have shortness of breath that gets worse. You have very bad or constant: Headache. Ear pain. Pain in your forehead, behind your eyes, and over your cheekbones (sinus pain). Chest pain. You have long-lasting (chronic) lung disease along with any of these: Making high-pitched whistling sounds when  you breathe, most often when you breathe out (wheezing). Long-lasting cough (more than 14 days). Coughing up blood. A change in your usual mucus. You have a stiff neck. You have changes in your: Vision. Hearing. Thinking. Mood. These symptoms may be an emergency. Get help right away. Call 911. Do not wait to see if the symptoms will go away. Do not drive yourself to the hospital. Summary An upper respiratory infection (URI) is caused by a germ (virus). The most common type of URI is often called the common cold. URIs usually get better within 7-10 days. Take over-the-counter and prescription medicines only as told by your doctor. This information is not intended to replace advice given to you by your health care provider. Make sure you discuss any questions you have with your health care provider. Document Revised: 07/01/2021 Document Reviewed: 07/01/2021 Elsevier Patient Education  [place]. Sore Throat When you have a sore throat, your throat may feel: Tender. Burning. Irritated. Scratchy. Painful when you swallow. Painful when you talk. Many things can cause a sore throat, such as: An infection. Allergies. Dry air. Smoke or pollution. Radiation treatment for cancer. Gastroesophageal reflux disease (GERD). A tumor. A sore throat can be the first sign of another sickness. It can happen with other problems, like: Coughing. Sneezing. Fever. Swelling of the glands in the neck. Most sore throats go away without treatment. Follow these instructions at home:   Medicines Take over-the-counter and prescription medicines only as told by your doctor. Children often get sore throats. Do not give your child aspirin. Use throat sprays to soothe your throat as told by your health care provider. Managing pain To help with pain: Sip warm liquids, such as broth, herbal tea, or warm water. Eat or drink cold or frozen liquids, such as frozen ice pops. Rinse your mouth  (gargle) with a salt water mixture 3-4 times a day or as needed. To make salt water, dissolve -1 tsp (3-6 g) of salt in 1 cup (237 mL) of warm water. Do not swallow this mixture. Suck on hard candy or throat lozenges. Put a cool-mist humidifier in your bedroom at night. Sit in the bathroom with the door closed for 5-10 minutes while you run hot water in the shower. General instructions Do not smoke or use any products that contain nicotine or tobacco. If you need help quitting, ask your doctor. Get plenty of rest. Drink enough fluid to keep your pee (urine) pale yellow. Wash your hands often for at least 20 seconds with soap and water. If soap and water are not available, use hand sanitizer. Contact a doctor if: You have a fever for more than 2-3 days. You keep having symptoms for more than 2-3 days. Your throat does not get better in 7 days. You have a fever and your symptoms suddenly get worse. Your child who is 3 months to 11 years old has a temperature of 102.44F (39C) or higher. Get help right away if: You have trouble breathing. You cannot swallow fluids, soft foods, or your spit. You have swelling in your throat or neck that gets worse. You feel like you  may vomit (nauseous) and this feeling lasts a long time. You cannot stop vomiting. These symptoms may be an emergency. Get help right away. Call your local emergency services (911 in the [place] Do not wait to see if the symptoms will go away. Do not drive yourself to the hospital. Summary A sore throat is a painful, burning, irritated, or scratchy throat. Many things can cause a sore throat. Take over-the-counter medicines only as told by your doctor. Get plenty of rest. Drink enough fluid to keep your pee (urine) pale yellow. Contact a doctor if your symptoms get worse or your sore throat does not get better within 7 days. This information is not intended to replace advice given to you by your health care provider. Make sure  you discuss any questions you have with your health care provider. Document Revised: 02/25/2021 Document Reviewed: 02/25/2021 Elsevier Patient Education  [place].

## 2021-11-09 NOTE — Progress Notes (Signed)
Acute Office Visit  Subjective:    Patient ID: Rachael Allen, female    DOB: 09/14/1971, 50 y.o.   MRN: [id number]  Chief Complaint  Patient presents with   Cough   Sore Throat    Cough This is a new problem. The current episode started yesterday. The problem has been gradually worsening. The problem occurs constantly. The cough is Productive of sputum. Associated symptoms include headaches and a sore throat. Pertinent negatives include no rash. She has tried nothing for the symptoms.  Sore Throat  This is a new problem. The current episode started yesterday. The problem has been gradually worsening. Neither side of throat is experiencing more pain than the other. There has been no fever. The pain is at a severity of 6/10. The pain is moderate. Associated symptoms include congestion, coughing, headaches and swollen glands. Pertinent negatives include no trouble swallowing. She has had exposure to strep.    Past Medical History:  Diagnosis Date   Bipolar disorder ([place])    Depression     Past Surgical History:  Procedure Laterality Date   CESAREAN SECTION     TONSILLECTOMY AND ADENOIDECTOMY      Family History  Problem Relation Age of Onset   Hypertension Mother    Mental illness Father    Hypertension Sister     Social History   Socioeconomic History   Marital status: Married    Spouse name: Not on file   Number of children: Not on file   Years of education: Not on file   Highest education level: Not on file  Occupational History   Not on file  Tobacco Use   Smoking status: Never   Smokeless tobacco: Never  Vaping Use   Vaping Use: Not on file  Substance and Sexual Activity   Alcohol use: No   Drug use: No   Sexual activity: Yes    Birth control/protection: I.U.D.  Other Topics Concern   Not on file  Social History Narrative   Not on file   Social Determinants of Health   Financial Resource Strain: Not on file  Food Insecurity: Not on file   Transportation Needs: Not on file  Physical Activity: Not on file  Stress: Not on file  Social Connections: Not on file  Intimate Partner Violence: Not on file    Outpatient Medications Prior to Visit  Medication Sig Dispense Refill   [facility] B-12 500 MCG tablet Take 500 mcg by mouth daily.     cyclobenzaprine (FLEXERIL) 10 MG tablet Take 1 tablet (10 mg total) by mouth 3 (three) times daily as needed for muscle spasms. 30 tablet 1   lisinopril (ZESTRIL) 20 MG tablet Take 1 tablet (20 mg total) by mouth daily. 90 tablet 1   lurasidone (LATUDA) 40 MG TABS tablet Take 1 tablet (40 mg total) by mouth daily with breakfast. (Patient taking differently: Take 40 mg by mouth daily.) 90 tablet 1   metFORMIN (GLUCOPHAGE-XR) 500 MG 24 hr tablet Take 1 tablet (500 mg total) by mouth 2 (two) times daily. 180 tablet 1   valACYclovir (VALTREX) 1000 MG tablet TAKE 2 TABLETS TWICE A DAY BY MOUTH FOR 1 DAY THEN AS NEEDED FOR FEVER BLISTERS 12 tablet 1   No facility-administered medications prior to visit.    Allergies  Allergen Reactions   Nitrofurantoin Other (See Comments)    SEVERE HEADACHE   Benadryl [Diphenhydramine Hcl]    Dilaudid [Hydromorphone Hcl]    Latex  Nsaids Nausea Only    In large doses   Vicodin [Hydrocodone-Acetaminophen]     Review of Systems  Constitutional: Negative.   HENT:  Positive for congestion and sore throat. Negative for trouble swallowing.   Respiratory:  Positive for cough.   Cardiovascular: Negative.   Gastrointestinal: Negative.   Skin: Negative.  Negative for rash.  Neurological:  Positive for headaches.  All other systems reviewed and are negative.     Objective:    Physical Exam Vitals and nursing note reviewed.  Constitutional:      Appearance: Normal appearance.  HENT:     Head: Normocephalic.     Right Ear: External ear normal.     Left Ear: External ear normal. Tenderness present.     Nose: Congestion present.     Mouth/Throat:     Mouth:  Mucous membranes are moist.     Pharynx: Oropharynx is clear.     Tonsils: Tonsillar exudate present.  Eyes:     Conjunctiva/sclera: Conjunctivae normal.  Cardiovascular:     Rate and Rhythm: Normal rate and regular rhythm.     Pulses: Normal pulses.     Heart sounds: Normal heart sounds.  Pulmonary:     Effort: Pulmonary effort is normal.     Breath sounds: Normal breath sounds.  Abdominal:     General: Bowel sounds are normal.  Skin:    General: Skin is warm.     Findings: No rash.  Neurological:     Mental Status: She is alert and oriented to person, place, and time.  Psychiatric:        Mood and Affect: Mood normal.        Behavior: Behavior normal.    BP (!) 141/92   Pulse (!) 118   Temp 97.6 F (36.4 C)   Ht '5\' 4"'  (1.626 m)   Wt 297 lb 6.4 oz (134.9 kg)   SpO2 95%   BMI 51.05 kg/m  Wt Readings from Last 3 Encounters:  11/09/21 297 lb 6.4 oz (134.9 kg)  08/10/21 292 lb (132.5 kg)  05/22/21 286 lb (129.7 kg)    Health Maintenance Due  Topic Date Due   Pneumococcal Vaccine 83-88 Years old (1 - PCV) Never done   OPHTHALMOLOGY EXAM  Never done   HIV Screening  Never done   FOOT EXAM  06/13/2020   COVID-19 Vaccine (3 - Booster for Pfizer series) 11/13/2020   INFLUENZA VACCINE  Never done   PAP SMEAR-Modifier  11/03/2021    There are no preventive care reminders to display for this patient.   Lab Results  Component Value Date   TSH 1.030 11/03/2018   Lab Results  Component Value Date   WBC 8.7 08/10/2021   HGB 13.9 08/10/2021   HCT 41.4 08/10/2021   MCV 84 08/10/2021   PLT 298 08/10/2021   Lab Results  Component Value Date   NA 141 08/10/2021   K 4.4 08/10/2021   CO2 24 08/10/2021   GLUCOSE 156 (H) 08/10/2021   BUN 9 08/10/2021   CREATININE 0.75 08/10/2021   BILITOT 0.3 08/10/2021   ALKPHOS 58 08/10/2021   AST 12 08/10/2021   ALT 15 08/10/2021   PROT 6.8 08/10/2021   ALBUMIN 4.4 08/10/2021   CALCIUM 9.7 08/10/2021   ANIONGAP 11 07/03/2020    EGFR 97 08/10/2021   Lab Results  Component Value Date   CHOL 143 08/10/2021   Lab Results  Component Value Date   HDL 34 (L)  08/10/2021   Lab Results  Component Value Date   LDLCALC 81 08/10/2021   Lab Results  Component Value Date   TRIG 162 (H) 08/10/2021   Lab Results  Component Value Date   CHOLHDL 4.2 08/10/2021   Lab Results  Component Value Date   HGBA1C 5.0 08/10/2021       Assessment & Plan:   Problem List Items Addressed This Visit       Other   Cough    Take meds as prescribed - Use a cool mist humidifier  -Use saline nose sprays frequently -Force fluids -For fever or aches or pains- take Tylenol or ibuprofen. -covid-19, strep and flu swabs completed results pending Follow up with worsening unresolved symptoms  Ps   Positive for flu, will send Tamiflu RX to pharmacy.   Follow up with worsening unresolved symptoms      Relevant Orders   Novel Coronavirus, NAA ([facility])   Veritor Flu A/B Waived   Sore throat - Primary    Take meds as prescribed - Use a cool mist humidifier  -Use saline nose sprays frequently -Force fluids -For fever or aches or pains- take Tylenol or ibuprofen. -covid-19, strep and flu swabs completed results pending Follow up with worsening unresolved symptoms      Relevant Orders   Rapid Strep Screen ([facility] ONLY)     Meds ordered this encounter  Medications   oseltamivir (TAMIFLU) 75 MG capsule    Sig: Take 1 capsule (75 mg total) by mouth 2 (two) times daily.    Dispense:  10 capsule    Refill:  0    Order Specific Question:   Supervising Provider    Answer:   Claretta Fraise [[id number]]      Ivy Lynn, NP

## 2021-11-10 LAB — NOVEL CORONAVIRUS, NAA: SARS-CoV-2, NAA: NOT DETECTED

## 2021-11-10 LAB — SARS-COV-2, NAA 2 DAY TAT

## 2021-11-11 ENCOUNTER — Ambulatory Visit: Payer: Medicare Other | Admitting: Nurse Practitioner

## 2021-11-16 ENCOUNTER — Encounter: Payer: Self-pay | Admitting: Nurse Practitioner

## 2021-11-16 ENCOUNTER — Ambulatory Visit (INDEPENDENT_AMBULATORY_CARE_PROVIDER_SITE_OTHER): Payer: BC Managed Care – PPO | Admitting: Nurse Practitioner

## 2021-11-16 VITALS — BP 133/74 | HR 77 | Temp 98.4°F | Ht 65.0 in | Wt 297.0 lb

## 2021-11-16 DIAGNOSIS — J029 Acute pharyngitis, unspecified: Secondary | ICD-10-CM

## 2021-11-16 DIAGNOSIS — R053 Chronic cough: Secondary | ICD-10-CM

## 2021-11-16 LAB — RAPID STREP SCREEN (MED CTR MEBANE ONLY): Strep Gp A Ag, IA W/Reflex: NEGATIVE

## 2021-11-16 LAB — CULTURE, GROUP A STREP

## 2021-11-16 MED ORDER — PREDNISONE 20 MG PO TABS: 40.0000 mg | ORAL_TABLET | Freq: Every day | ORAL | 0 refills | Status: AC

## 2021-11-16 NOTE — Patient Instructions (Signed)

## 2021-11-16 NOTE — Progress Notes (Signed)
   Subjective:    Patient ID: Rachael Allen, female    DOB: Jul 04, 1971, 50 y.o.   MRN: [id number]   Chief Complaint: Sore Throat and Cough   HPI Patient was dx with flu 1 week ago and was given tamiflu. Now she still has a bad cough and her throat is sore. She is having some SOB when coughing. She is having to take cough meds every 4 hours. Ears also feel full.     Review of Systems  Constitutional:  Positive for fatigue. Negative for chills and fever.  HENT:  Positive for congestion and sore throat.   Respiratory:  Positive for cough and shortness of breath (only when coughing).   Cardiovascular:  Negative for chest pain.  Neurological: Negative.   Psychiatric/Behavioral: Negative.        Objective:   Physical Exam Vitals reviewed.  Constitutional:      Appearance: She is well-developed.  HENT:     Right Ear: Tympanic membrane normal.     Left Ear: Tympanic membrane normal.     Nose: Congestion and rhinorrhea present.     Mouth/Throat:     Mouth: Mucous membranes are moist.     Pharynx: Posterior oropharyngeal erythema present.  Eyes:     Extraocular Movements: Extraocular movements intact.     Pupils: Pupils are equal, round, and reactive to light.  Cardiovascular:     Rate and Rhythm: Normal rate and regular rhythm.  Pulmonary:     Effort: Pulmonary effort is normal.     Breath sounds: Normal breath sounds.  Musculoskeletal:     Cervical back: Normal range of motion.  Neurological:     Mental Status: She is alert.   BP 133/74   Pulse 77   Temp 98.4 F (36.9 C) (Oral)   Ht 5\' 5"  (1.651 m)   Wt 297 lb (134.7 kg)   BMI 49.42 kg/m    Strep negative      Assessment & Plan:  Rachael Allen comes in today with chief complaint of Sore Throat and Cough   Diagnosis and orders addressed:  1. Sore throat - Rapid Strep Screen ([facility] ONLY)  2. Persistent cough .force fluids Rest Run humidifier RTO prn Meds ordered this encounter  Medications    predniSONE (DELTASONE) 20 MG tablet    Sig: Take 2 tablets (40 mg total) by mouth daily with breakfast for 5 days. 2 po daily for 5 days    Dispense:  10 tablet    Refill:  0    Order Specific Question:   Supervising Provider    Answer:   Caryl Pina A [[id number]]     Follow up plan: prn   Rachael Hassell Done, FNP

## 2021-11-20 ENCOUNTER — Ambulatory Visit (INDEPENDENT_AMBULATORY_CARE_PROVIDER_SITE_OTHER): Payer: BC Managed Care – PPO | Admitting: Family Medicine

## 2021-11-20 ENCOUNTER — Encounter: Payer: Self-pay | Admitting: Family Medicine

## 2021-11-20 ENCOUNTER — Ambulatory Visit (INDEPENDENT_AMBULATORY_CARE_PROVIDER_SITE_OTHER): Payer: BC Managed Care – PPO

## 2021-11-20 VITALS — BP 124/72 | HR 87 | Temp 98.8°F | Ht 65.0 in | Wt 298.1 lb

## 2021-11-20 DIAGNOSIS — R0602 Shortness of breath: Secondary | ICD-10-CM

## 2021-11-20 DIAGNOSIS — J988 Other specified respiratory disorders: Secondary | ICD-10-CM | POA: Diagnosis not present

## 2021-11-20 DIAGNOSIS — R051 Acute cough: Secondary | ICD-10-CM | POA: Diagnosis not present

## 2021-11-20 MED ORDER — BENZONATATE 100 MG PO CAPS: 100.0000 mg | ORAL_CAPSULE | Freq: Three times a day (TID) | ORAL | 0 refills | Status: DC | PRN

## 2021-11-20 MED ORDER — AMOXICILLIN 875 MG PO TABS: 875.0000 mg | ORAL_TABLET | Freq: Two times a day (BID) | ORAL | 0 refills | Status: AC

## 2021-11-20 NOTE — Progress Notes (Signed)
Acute Office Visit  Subjective:    Patient ID: BLONDINA CODERRE, female    DOB: [birth year], 50 y.o.   MRN: [id number]  Chief Complaint  Patient presents with   Cough    HPI Patient is in today for cough x 2 weeks. This started when she was diagnosed with the flu 2 weeks ago. She reports that her cough has since worsened. She also reports some mild shortness of breath and discomfort in her lower right posterior ribs. She has tried dayquil without imporvment. She is current taking prednisone without improvement. She denies fever or chills.   Past Medical History:  Diagnosis Date   Bipolar disorder ([place])    Depression     Past Surgical History:  Procedure Laterality Date   CESAREAN SECTION     TONSILLECTOMY AND ADENOIDECTOMY      Family History  Problem Relation Age of Onset   Hypertension Mother    Mental illness Father    Hypertension Sister     Social History   Socioeconomic History   Marital status: Married    Spouse name: Not on file   Number of children: Not on file   Years of education: Not on file   Highest education level: Not on file  Occupational History   Not on file  Tobacco Use   Smoking status: Never   Smokeless tobacco: Never  Vaping Use   Vaping Use: Not on file  Substance and Sexual Activity   Alcohol use: No   Drug use: No   Sexual activity: Yes    Birth control/protection: I.U.D.  Other Topics Concern   Not on file  Social History Narrative   Not on file   Social Determinants of Health   Financial Resource Strain: Not on file  Food Insecurity: Not on file  Transportation Needs: Not on file  Physical Activity: Not on file  Stress: Not on file  Social Connections: Not on file  Intimate Partner Violence: Not on file    Outpatient Medications Prior to Visit  Medication Sig Dispense Refill   [facility] B-12 500 MCG tablet Take 500 mcg by mouth daily.     cyclobenzaprine (FLEXERIL) 10 MG tablet Take 1 tablet (10 mg total) by mouth 3 (three)  times daily as needed for muscle spasms. 30 tablet 1   lisinopril (ZESTRIL) 20 MG tablet Take 1 tablet (20 mg total) by mouth daily. 90 tablet 1   lurasidone (LATUDA) 40 MG TABS tablet Take 1 tablet (40 mg total) by mouth daily with breakfast. (Patient taking differently: Take 40 mg by mouth daily.) 90 tablet 1   metFORMIN (GLUCOPHAGE-XR) 500 MG 24 hr tablet Take 1 tablet (500 mg total) by mouth 2 (two) times daily. 180 tablet 1   predniSONE (DELTASONE) 20 MG tablet Take 2 tablets (40 mg total) by mouth daily with breakfast for 5 days. 2 po daily for 5 days 10 tablet 0   valACYclovir (VALTREX) 1000 MG tablet TAKE 2 TABLETS TWICE A DAY BY MOUTH FOR 1 DAY THEN AS NEEDED FOR FEVER BLISTERS 12 tablet 1   No facility-administered medications prior to visit.    Allergies  Allergen Reactions   Nitrofurantoin Other (See Comments)    SEVERE HEADACHE   Benadryl [Diphenhydramine Hcl]    Dilaudid [Hydromorphone Hcl]    Latex    Nsaids Nausea Only    In large doses   Vicodin [Hydrocodone-Acetaminophen]     Review of Systems As per HPI.  Objective:    Physical Exam Vitals and nursing note reviewed.  Constitutional:      General: She is not in acute distress.    Appearance: She is not ill-appearing, toxic-appearing or diaphoretic.  HENT:     Head: Normocephalic and atraumatic.  Cardiovascular:     Rate and Rhythm: Normal rate and regular rhythm.     Heart sounds: Normal heart sounds. No murmur heard. Pulmonary:     Effort: No respiratory distress.     Breath sounds: Rhonchi present. No wheezing or rales.  Chest:     Chest wall: No tenderness.  Musculoskeletal:     Right lower leg: No edema.     Left lower leg: No edema.  Skin:    General: Skin is warm and dry.  Neurological:     General: No focal deficit present.     Mental Status: She is alert and oriented to person, place, and time.  Psychiatric:        Mood and Affect: Mood normal.        Behavior: Behavior normal.    BP  124/72   Pulse 87   Temp 98.8 F (37.1 C) (Temporal)   Ht _0  (1.651 m)   Wt 298 lb 2 oz (135.2 kg)   SpO2 94%   BMI 49.61 kg/m  Wt Readings from Last 3 Encounters:  11/20/21 298 lb 2 oz (135.2 kg)  11/16/21 297 lb (134.7 kg)  11/09/21 297 lb 6.4 oz (134.9 kg)    Health Maintenance Due  Topic Date Due   Pneumococcal Vaccine 37-73 Years old (1 - PCV) Never done   OPHTHALMOLOGY EXAM  Never done   HIV Screening  Never done   FOOT EXAM  06/13/2020   COVID-19 Vaccine (3 - Booster for Pfizer series) 11/13/2020   Zoster Vaccines- Shingrix (1 of 2) Never done   INFLUENZA VACCINE  Never done   PAP SMEAR-Modifier  11/03/2021    There are no preventive care reminders to display for this patient.   Lab Results  Component Value Date   TSH 1.030 11/03/2018   Lab Results  Component Value Date   WBC 8.7 08/10/2021   HGB 13.9 08/10/2021   HCT 41.4 08/10/2021   MCV 84 08/10/2021   PLT 298 08/10/2021   Lab Results  Component Value Date   NA 141 08/10/2021   K 4.4 08/10/2021   CO2 24 08/10/2021   GLUCOSE 156 (H) 08/10/2021   BUN 9 08/10/2021   CREATININE 0.75 08/10/2021   BILITOT 0.3 08/10/2021   ALKPHOS 58 08/10/2021   AST 12 08/10/2021   ALT 15 08/10/2021   PROT 6.8 08/10/2021   ALBUMIN 4.4 08/10/2021   CALCIUM 9.7 08/10/2021   ANIONGAP 11 07/03/2020   EGFR 97 08/10/2021   Lab Results  Component Value Date   CHOL 143 08/10/2021   Lab Results  Component Value Date   HDL 34 (L) 08/10/2021   Lab Results  Component Value Date   LDLCALC 81 08/10/2021   Lab Results  Component Value Date   TRIG 162 (H) 08/10/2021   Lab Results  Component Value Date   CHOLHDL 4.2 08/10/2021   Lab Results  Component Value Date   HGBA1C 5.0 08/10/2021       Assessment & Plan:   Keora was seen today for cough.  Diagnoses and all orders for this visit:  Respiratory infection Xray today in office, appear hazy compared to previous however radiology report is normal.  Rhonchi present on exam with oxygen saturation of 94% today. Respirations unlabored. Amoxicillin as below, mucinex, tessalon perles, hydration. Return to office for new or worsening symptoms, or if symptoms persist.  -     DG Chest 2 View; Future -     amoxicillin (AMOXIL) 875 MG tablet; Take 1 tablet (875 mg total) by mouth 2 (two) times daily for 10 days. -     benzonatate (TESSALON PERLES) 100 MG capsule; Take 1 capsule (100 mg total) by mouth 3 (three) times daily as needed for cough.  The patient indicates understanding of these issues and agrees with the plan.  Gwenlyn Perking, FNP

## 2021-11-20 NOTE — Patient Instructions (Signed)

## 2021-11-23 ENCOUNTER — Ambulatory Visit: Payer: Medicare Other | Admitting: Nurse Practitioner

## 2021-12-23 ENCOUNTER — Ambulatory Visit
Admission: RE | Admit: 2021-12-23 | Discharge: 2021-12-23 | Disposition: A | Discharge status: Home / Self Care | Payer: BC Managed Care – PPO | Source: Ambulatory Visit | Attending: Nurse Practitioner | Admitting: Nurse Practitioner

## 2021-12-23 DIAGNOSIS — Z1231 Encounter for screening mammogram for malignant neoplasm of breast: Secondary | ICD-10-CM | POA: Diagnosis not present

## 2021-12-31 ENCOUNTER — Ambulatory Visit (INDEPENDENT_AMBULATORY_CARE_PROVIDER_SITE_OTHER): Payer: BC Managed Care – PPO | Admitting: Nurse Practitioner

## 2021-12-31 ENCOUNTER — Encounter: Payer: Self-pay | Admitting: Nurse Practitioner

## 2021-12-31 ENCOUNTER — Other Ambulatory Visit (HOSPITAL_COMMUNITY)
Admission: RE | Admit: 2021-12-31 | Discharge: 2021-12-31 | Disposition: A | Discharge status: Home / Self Care | Payer: BC Managed Care – PPO | Source: Ambulatory Visit | Attending: Nurse Practitioner | Admitting: Nurse Practitioner

## 2021-12-31 VITALS — BP 132/79 | HR 102 | Temp 96.3°F | Resp 20 | Ht 65.0 in | Wt 293.0 lb

## 2021-12-31 DIAGNOSIS — Z01419 Encounter for gynecological examination (general) (routine) without abnormal findings: Secondary | ICD-10-CM | POA: Insufficient documentation

## 2021-12-31 LAB — MICROSCOPIC EXAMINATION: Renal Epithel, UA: NONE SEEN /hpf

## 2021-12-31 LAB — URINALYSIS, COMPLETE
Bilirubin, UA: NEGATIVE
Glucose, UA: NEGATIVE
Leukocytes,UA: NEGATIVE
Nitrite, UA: NEGATIVE
RBC, UA: NEGATIVE
Specific Gravity, UA: 1.02 (ref 1.005–1.030)
Urobilinogen, Ur: 0.2 mg/dL (ref 0.2–1.0)
pH, UA: 6 (ref 5.0–7.5)

## 2021-12-31 NOTE — Progress Notes (Signed)
Subjective:    Patient ID: Rachael Allen, female    DOB: 1970-12-15, 51 y.o.   MRN: [id number]   Chief Complaint: Gynecologic Exam   HPI Patient comes in  today for GYN exam only. She has been doing well.     Review of Systems  Constitutional:  Negative for diaphoresis.  Eyes:  Negative for pain.  Respiratory:  Negative for shortness of breath.   Cardiovascular:  Negative for chest pain, palpitations and leg swelling.  Endocrine: Negative for polydipsia.  Neurological:  Negative for dizziness and weakness.  Hematological:  Does not bruise/bleed easily.  All other systems reviewed and are negative.     Objective:   Physical Exam Vitals and nursing note reviewed.  Constitutional:      General: She is not in acute distress.    Appearance: Normal appearance. She is well-developed.  HENT:     Head: Normocephalic.     Right Ear: Tympanic membrane normal.     Left Ear: Tympanic membrane normal.     Nose: Nose normal.     Mouth/Throat:     Mouth: Mucous membranes are moist.  Eyes:     Pupils: Pupils are equal, round, and reactive to light.  Neck:     Vascular: No carotid bruit or JVD.  Cardiovascular:     Rate and Rhythm: Normal rate and regular rhythm.     Heart sounds: Normal heart sounds.  Pulmonary:     Effort: Pulmonary effort is normal. No respiratory distress.     Breath sounds: Normal breath sounds. No wheezing or rales.  Chest:     Chest wall: No tenderness.  Abdominal:     General: Bowel sounds are normal. There is no distension or abdominal bruit.     Palpations: Abdomen is soft. There is no hepatomegaly, splenomegaly, mass or pulsatile mass.     Tenderness: There is no abdominal tenderness.  Genitourinary:    General: Normal vulva.     Vagina: No vaginal discharge.     Rectum: Normal.     Comments: Cervix parous and pink No adnexa, masses or tenderness Musculoskeletal:        General: Normal range of motion.     Cervical back: Normal range of  motion and neck supple.  Lymphadenopathy:     Cervical: No cervical adenopathy.  Skin:    General: Skin is warm and dry.  Neurological:     Mental Status: She is alert and oriented to person, place, and time.     Deep Tendon Reflexes: Reflexes are normal and symmetric.  Psychiatric:        Behavior: Behavior normal.        Thought Content: Thought content normal.        Judgment: Judgment normal.   BP 132/79    Pulse (!) 102    Temp (!) 96.3 F (35.7 C) (Temporal)    Resp 20    Ht 5\' 5"  (1.651 m)    Wt 293 lb (132.9 kg)    SpO2 96%    BMI 48.76 kg/m       Assessment & Plan:   Rachael Allen in today with chief complaint of Gynecologic Exam   1. Encounter for gynecological examination No labs drawn today- will repeat in march with chronic follow up - Urinalysis, Complete - Cytology - PAP    The above assessment and management plan was discussed with the patient. The patient verbalized understanding of and has agreed to the  management plan. Patient is aware to call the clinic if symptoms persist or worsen. Patient is aware when to return to the clinic for a follow-up visit. Patient educated on when it is appropriate to go to the emergency department.   Mary-Margaret Hassell Done, FNP

## 2021-12-31 NOTE — Patient Instructions (Signed)
Menopause Menopause is the normal time of a woman's life when menstrual periods stop completely. It marks the natural end to a woman's ability to become pregnant. It can be defined as the absence of a menstrual period for 12 months without another medical cause. The transition to menopause (perimenopause) most often happens between the ages of 33 and 61, and can last for many years. During perimenopause, hormone levels change in your body, which can cause symptoms and affect your health. Menopause may increase your risk for: Weakened bones (osteoporosis), which causes fractures. Depression. Hardening and narrowing of the arteries (atherosclerosis), which can cause heart attacks and strokes. What are the causes? This condition is usually caused by a natural change in hormone levels that happens as you get older. The condition may also be caused by changes that are not natural, including: Surgery to remove both ovaries (surgical menopause). Side effects from some medicines, such as chemotherapy used to treat cancer (chemical menopause). What increases the risk? This condition is more likely to start at an earlier age if you have certain medical conditions or have undergone treatments, including: A tumor of the pituitary gland in the brain. A disease that affects the ovaries and hormones. Certain cancer treatments, such as chemotherapy or hormone therapy, or radiation therapy on the pelvis. Heavy smoking and excessive alcohol use. Family history of early menopause. This condition is also more likely to develop earlier in women who are very thin. What are the signs or symptoms? Symptoms of this condition include: Hot flashes. Irregular menstrual periods. Night sweats. Changes in feelings about sex. This could be a decrease in sex drive or an increased discomfort around your sexuality. Vaginal dryness and thinning of the vaginal walls. This may cause painful sex. Dryness of the skin and  development of wrinkles. Headaches. Problems sleeping (insomnia). Mood swings or irritability. Memory problems. Weight gain. Hair growth on the face and chest. Bladder infections or problems with urinating. How is this diagnosed? This condition is diagnosed based on your medical history, a physical exam, your age, your menstrual history, and your symptoms. Hormone tests may also be done. How is this treated? In some cases, no treatment is needed. You and your health care provider should make a decision together about whether treatment is necessary. Treatment will be based on your individual condition and preferences. Treatment for this condition focuses on managing symptoms. Treatment may include: Menopausal hormone therapy (MHT). Medicines to treat specific symptoms or complications. Acupuncture. Vitamin or herbal supplements. Before starting treatment, make sure to let your health care provider know if you have a personal or family history of these conditions: Heart disease. Breast cancer. Blood clots. Diabetes. Osteoporosis. Follow these instructions at home: Lifestyle Do not use any products that contain nicotine or tobacco, such as cigarettes, e-cigarettes, and chewing tobacco. If you need help quitting, ask your health care provider. Get at least 30 minutes of physical activity on 5 or more days each week. Avoid alcoholic and caffeinated beverages, as well as spicy foods. This may help prevent hot flashes. Get 7-8 hours of sleep each night. If you have hot flashes, try: Dressing in layers. Avoiding things that may trigger hot flashes, such as spicy food, warm places, or stress. Taking slow, deep breaths when a hot flash starts. Keeping a fan in your home and office. Find ways to manage stress, such as deep breathing, meditation, or journaling. Consider going to group therapy with other women who are having menopause symptoms. Ask your health care  provider about recommended  group therapy meetings. Eating and drinking  Eat a healthy, balanced diet that contains whole grains, lean protein, low-fat dairy, and plenty of fruits and vegetables. Your health care provider may recommend adding more soy to your diet. Foods that contain soy include tofu, tempeh, and soy milk. Eat plenty of foods that contain calcium and vitamin D for bone health. Items that are rich in calcium include low-fat milk, yogurt, beans, almonds, sardines, broccoli, and kale. Medicines Take over-the-counter and prescription medicines only as told by your health care provider. Talk with your health care provider before starting any herbal supplements. If prescribed, take vitamins and supplements as told by your health care provider. General instructions  Keep track of your menstrual periods, including: When they occur. How heavy they are and how long they last. How much time passes between periods. Keep track of your symptoms, noting when they start, how often you have them, and how long they last. Use vaginal lubricants or moisturizers to help with vaginal dryness and improve comfort during sex. Keep all follow-up visits. This is important. This includes any group therapy or counseling. Contact a health care provider if: You are still having menstrual periods after age 63. You have pain during sex. You have not had a period for 12 months and you develop vaginal bleeding. Get help right away if you have: Severe depression. Excessive vaginal bleeding. Pain when you urinate. A fast or irregular heartbeat (palpitations). Severe headaches. Abdominal pain or severe indigestion. Summary Menopause is a normal time of life when menstrual periods stop completely. It is usually defined as the absence of a menstrual period for 12 months without another medical cause. The transition to menopause (perimenopause) most often happens between the ages of 58 and 49 and can last for several years. Symptoms  can be managed through medicines, lifestyle changes, and complementary therapies such as acupuncture. Eat a balanced diet that is rich in nutrients to promote bone health and heart health and to manage symptoms during menopause. This information is not intended to replace advice given to you by your health care provider. Make sure you discuss any questions you have with your health care provider. Document Revised: 08/29/2020 Document Reviewed: 05/15/2020 Elsevier Patient Education  [place].

## 2022-01-01 LAB — CYTOLOGY - PAP
Adequacy: ABSENT
Diagnosis: NEGATIVE

## 2022-02-15 ENCOUNTER — Encounter: Payer: Self-pay | Admitting: Nurse Practitioner

## 2022-02-15 ENCOUNTER — Ambulatory Visit (INDEPENDENT_AMBULATORY_CARE_PROVIDER_SITE_OTHER): Payer: BC Managed Care – PPO | Admitting: Nurse Practitioner

## 2022-02-15 VITALS — BP 133/85 | HR 67 | Temp 98.1°F | Resp 20 | Ht 65.0 in | Wt 293.0 lb

## 2022-02-15 DIAGNOSIS — M797 Fibromyalgia: Secondary | ICD-10-CM

## 2022-02-15 DIAGNOSIS — I10 Essential (primary) hypertension: Secondary | ICD-10-CM | POA: Diagnosis not present

## 2022-02-15 DIAGNOSIS — G43719 Chronic migraine without aura, intractable, without status migrainosus: Secondary | ICD-10-CM | POA: Diagnosis not present

## 2022-02-15 DIAGNOSIS — K5790 Diverticulosis of intestine, part unspecified, without perforation or abscess without bleeding: Secondary | ICD-10-CM

## 2022-02-15 DIAGNOSIS — E282 Polycystic ovarian syndrome: Secondary | ICD-10-CM

## 2022-02-15 DIAGNOSIS — E119 Type 2 diabetes mellitus without complications: Secondary | ICD-10-CM | POA: Diagnosis not present

## 2022-02-15 DIAGNOSIS — F3342 Major depressive disorder, recurrent, in full remission: Secondary | ICD-10-CM

## 2022-02-15 DIAGNOSIS — F3160 Bipolar disorder, current episode mixed, unspecified: Secondary | ICD-10-CM

## 2022-02-15 LAB — LIPID PANEL

## 2022-02-15 LAB — BAYER DCA HB A1C WAIVED: HB A1C (BAYER DCA - WAIVED): 5.6 % (ref 4.8–5.6)

## 2022-02-15 MED ORDER — LISINOPRIL 20 MG PO TABS: 20.0000 mg | ORAL_TABLET | Freq: Every day | ORAL | 1 refills | Status: DC

## 2022-02-15 MED ORDER — METFORMIN HCL ER 500 MG PO TB24: 500.0000 mg | ORAL_TABLET | Freq: Two times a day (BID) | ORAL | 1 refills | Status: DC

## 2022-02-15 NOTE — Progress Notes (Signed)
? ?Subjective:  ? ? Patient ID: Rachael Allen, female    DOB: [birth year], 51 y.o.   MRN: [id number] ? ? ?Chief Complaint: Medical Management of Chronic Issues ?  ? ?HPI: ? ?Rachael Allen is a 51 y.o. who identifies as a female who was assigned female at birth.  ? ?Social history: ?Lives with: husband and children ?Work history: on disability ? ? ?Comes in today for follow up of the following chronic medical issues: ? ?1. Type 2 diabetes mellitus without complication, without long-term current use of insulin ([place]) ?She does not check her blood sugars at home. Tries to watch diet. ?Lab Results  ?Component Value Date  ? HGBA1C 5.0 08/10/2021  ? ? ? ? ?2. Primary hypertension ?Does not check blood pressure at home. No c/o chest pain, SOb or headache. ?BP Readings from Last 3 Encounters:  ?02/15/22 133/85  ?12/31/21 132/79  ?11/20/21 124/72  ? ? ? ?3. Intractable chronic migraine without aura and without status migrainosus ?Doing better. Only having maybe 1 every 8 months. ? ?4. Diverticulosis ?No recent flare ups. Watches diet to prevent flare up ? ?5. Fibromyalgia muscle pain ?Seems to be worsening. Has pain daily 6-7/10. Is on no pain meds currently ? ?6. Bipolar affective disorder, current episode mixed, current episode severity unspecified ([place]) ?Is on latuda and is doing well. See psych every 3 months. Says she is doing wel. ?Depression screen [facility] 2/9 02/15/2022 12/31/2021 11/09/2021  ?Decreased Interest 0 0 0  ?Down, Depressed, Hopeless 0 0 0  ?PHQ - 2 Score 0 0 0  ?Altered sleeping 0 0 -  ?Tired, decreased energy 0 0 -  ?Change in appetite 0 0 -  ?Feeling bad or failure about yourself  0 0 -  ?Trouble concentrating 0 0 -  ?Moving slowly or fidgety/restless 0 0 -  ?Suicidal thoughts 0 0 -  ?PHQ-9 Score 0 0 -  ?Difficult doing work/chores Not difficult at all Not difficult at all -  ?Some recent data might be hidden  ? ? ? ?7. Recurrent major depressive disorder, in full remission ([place]) ?Again is on latuda and is  doing well. ? ?8. Morbid obesity ([place]) ?No recent weight changes ?Wt Readings from Last 3 Encounters:  ?02/15/22 293 lb (132.9 kg)  ?12/31/21 293 lb (132.9 kg)  ?11/20/21 298 lb 2 oz (135.2 kg)  ? ?BMI Readings from Last 3 Encounters:  ?02/15/22 48.76 kg/m?  ?12/31/21 48.76 kg/m?  ?11/20/21 49.61 kg/m?  ? ? ? ? ?New complaints: ?None today ?n ?Allergies  ?Allergen Reactions  ? Nitrofurantoin Other (See Comments)  ?  SEVERE HEADACHE  ? Benadryl [Diphenhydramine Hcl]   ? Dilaudid [Hydromorphone Hcl]   ? Latex   ? Nsaids Nausea Only  ?  In large doses  ? Vicodin [Hydrocodone-Acetaminophen]   ? ?Outpatient Encounter Medications as of 02/15/2022  ?Medication Sig  ? [facility] B-12 500 MCG tablet Take 500 mcg by mouth daily.  ? cyclobenzaprine (FLEXERIL) 10 MG tablet Take 1 tablet (10 mg total) by mouth 3 (three) times daily as needed for muscle spasms.  ? lisinopril (ZESTRIL) 20 MG tablet Take 1 tablet (20 mg total) by mouth daily.  ? lurasidone (LATUDA) 40 MG TABS tablet Take 1 tablet (40 mg total) by mouth daily with breakfast. (Patient taking differently: Take 40 mg by mouth daily.)  ? metFORMIN (GLUCOPHAGE-XR) 500 MG 24 hr tablet Take 1 tablet (500 mg total) by mouth 2 (two) times daily.  ? valACYclovir (VALTREX) 1000 MG  tablet TAKE 2 TABLETS TWICE A DAY BY MOUTH FOR 1 DAY THEN AS NEEDED FOR FEVER BLISTERS  ? ?No facility-administered encounter medications on file as of 02/15/2022.  ? ? ?Past Surgical History:  ?Procedure Laterality Date  ? CESAREAN SECTION    ? TONSILLECTOMY AND ADENOIDECTOMY    ? ? ?Family History  ?Problem Relation Age of Onset  ? Breast cancer Mother 22  ? Hypertension Mother   ? Mental illness Father   ? Hypertension Sister   ? ? ? ? ?Controlled substance contract: n/a ? ? ? ? ?Review of Systems  ?Constitutional:  Negative for diaphoresis.  ?Eyes:  Negative for pain.  ?Respiratory:  Negative for shortness of breath.   ?Cardiovascular:  Negative for chest pain, palpitations and leg swelling.   ?Gastrointestinal:  Negative for abdominal pain.  ?Endocrine: Negative for polydipsia.  ?Skin:  Negative for rash.  ?Neurological:  Negative for dizziness, weakness and headaches.  ?Hematological:  Does not bruise/bleed easily.  ?All other systems reviewed and are negative. ? ?   ?Objective:  ? Physical Exam ?Vitals and nursing note reviewed.  ?Constitutional:   ?   General: She is not in acute distress. ?   Appearance: Normal appearance. She is well-developed.  ?HENT:  ?   Head: Normocephalic.  ?   Right Ear: Tympanic membrane normal.  ?   Left Ear: Tympanic membrane normal.  ?   Nose: Nose normal.  ?   Mouth/Throat:  ?   Mouth: Mucous membranes are moist.  ?Eyes:  ?   Pupils: Pupils are equal, round, and reactive to light.  ?Neck:  ?   Vascular: No carotid bruit or JVD.  ?Cardiovascular:  ?   Rate and Rhythm: Normal rate and regular rhythm.  ?   Heart sounds: Normal heart sounds.  ?Pulmonary:  ?   Effort: Pulmonary effort is normal. No respiratory distress.  ?   Breath sounds: Normal breath sounds. No wheezing or rales.  ?Chest:  ?   Chest wall: No tenderness.  ?Abdominal:  ?   General: Bowel sounds are normal. There is no distension or abdominal bruit.  ?   Palpations: Abdomen is soft. There is no hepatomegaly, splenomegaly, mass or pulsatile mass.  ?   Tenderness: There is no abdominal tenderness.  ?Musculoskeletal:     ?   General: Normal range of motion.  ?   Cervical back: Normal range of motion and neck supple.  ?Lymphadenopathy:  ?   Cervical: No cervical adenopathy.  ?Skin: ?   General: Skin is warm and dry.  ?Neurological:  ?   Mental Status: She is alert and oriented to person, place, and time.  ?   Deep Tendon Reflexes: Reflexes are normal and symmetric.  ?Psychiatric:     ?   Behavior: Behavior normal.     ?   Thought Content: Thought content normal.     ?   Judgment: Judgment normal.  ? ? ?BP 133/85   Pulse 67   Temp 98.1 ?F (36.7 ?C) (Temporal)   Resp 20   Ht '5\' 5"'  (1.651 m)   Wt 293 lb (132.9  kg)   SpO2 97%   BMI 48.76 kg/m?  ? ?HGBA1c 5.6% ? ?   ?Assessment & Plan:  ?KALEISHA BHARGAVA comes in today with chief complaint of Medical Management of Chronic Issues ? ? ?Diagnosis and orders addressed: ? ?1. Type 2 diabetes mellitus without complication, without long-term current use of insulin ([place]) ?Continue to wtahc  carbs in diet ?- Bayer DCA Hb A1c Waived ?- Lipid panel ?- Microalbumin / creatinine urine ratio ? ?2. Primary hypertension ?Low sodium diet ?- CBC with Differential/Platelet ?- CMP14+EGFR ?- lisinopril (ZESTRIL) 20 MG tablet; Take 1 tablet (20 mg total) by mouth daily.  Dispense: 90 tablet; Refill: 1 ? ?3. Intractable chronic migraine without aura and without status migrainosus ?Avoid caffeine ? ?4. Diverticulosis ?Watch diet to prevent flar eup ? ?5. Fibromyalgia muscle pain ?Moist heat to joints' ?stay active  ? ?6. Bipolar affective disorder, current episode mixed, current episode severity unspecified ([place]) ?continue follow up with psych ? ?7. Recurrent major depressive disorder, in full remission ([place]) ?Again continue follow up with psych ? ?8. Morbid obesity ([place]) ?Discussed diet and exercise for person with BMI >25 ?Will recheck weight in 3-6 months ? ? ? ?9. PCOS (polycystic ovarian syndrome) ?- metFORMIN (GLUCOPHAGE-XR) 500 MG 24 hr tablet; Take 1 tablet (500 mg total) by mouth 2 (two) times daily.  Dispense: 180 tablet; Refill: 1 ? ? ?Labs pending ?Health Maintenance reviewed ?Diet and exercise encouraged ? ?Follow up plan: ?6 months ? ? ?Mary-Margaret Hassell Done, FNP ? ? ?

## 2022-02-16 LAB — CBC WITH DIFFERENTIAL/PLATELET
Basophils Absolute: 0.1 10*3/uL (ref 0.0–0.2)
Basos: 1 %
EOS (ABSOLUTE): 0.2 10*3/uL (ref 0.0–0.4)
Eos: 3 %
Hematocrit: 41.2 % (ref 34.0–46.6)
Hemoglobin: 14.3 g/dL (ref 11.1–15.9)
Immature Grans (Abs): 0.1 10*3/uL (ref 0.0–0.1)
Immature Granulocytes: 1 %
Lymphocytes Absolute: 2.1 10*3/uL (ref 0.7–3.1)
Lymphs: 28 %
MCH: 28.2 pg (ref 26.6–33.0)
MCHC: 34.7 g/dL (ref 31.5–35.7)
MCV: 81 fL (ref 79–97)
Monocytes Absolute: 0.4 10*3/uL (ref 0.1–0.9)
Monocytes: 6 %
Neutrophils Absolute: 4.7 10*3/uL (ref 1.4–7.0)
Neutrophils: 61 %
Platelets: 311 10*3/uL (ref 150–450)
RBC: 5.07 x10E6/uL (ref 3.77–5.28)
RDW: 14 % (ref 11.7–15.4)
WBC: 7.5 10*3/uL (ref 3.4–10.8)

## 2022-02-16 LAB — LIPID PANEL
Chol/HDL Ratio: 4.5 ratio — ABNORMAL HIGH (ref 0.0–4.4)
Cholesterol, Total: 147 mg/dL (ref 100–199)
HDL: 33 mg/dL — ABNORMAL LOW (ref >39)
LDL Chol Calc (NIH): 87 mg/dL (ref 0–99)
Triglycerides: 155 mg/dL — ABNORMAL HIGH (ref 0–149)
VLDL Cholesterol Cal: 27 mg/dL (ref 5–40)

## 2022-02-16 LAB — CMP14+EGFR
ALT: 22 IU/L (ref 0–32)
AST: 13 IU/L (ref 0–40)
Albumin/Globulin Ratio: 1.8 (ref 1.2–2.2)
Albumin: 4.3 g/dL (ref 3.8–4.9)
Alkaline Phosphatase: 64 IU/L (ref 44–121)
BUN/Creatinine Ratio: 12 (ref 9–23)
BUN: 9 mg/dL (ref 6–24)
Bilirubin Total: 0.4 mg/dL (ref 0.0–1.2)
CO2: 23 mmol/L (ref 20–29)
Calcium: 9.1 mg/dL (ref 8.7–10.2)
Chloride: 102 mmol/L (ref 96–106)
Creatinine, Ser: 0.77 mg/dL (ref 0.57–1.00)
Globulin, Total: 2.4 g/dL (ref 1.5–4.5)
Glucose: 123 mg/dL — ABNORMAL HIGH (ref 70–99)
Potassium: 4.1 mmol/L (ref 3.5–5.2)
Sodium: 140 mmol/L (ref 134–144)
Total Protein: 6.7 g/dL (ref 6.0–8.5)
eGFR: 93 mL/min/{1.73_m2} (ref >59)

## 2022-02-16 LAB — MICROALBUMIN / CREATININE URINE RATIO
Creatinine, Urine: 128.4 mg/dL
Microalb/Creat Ratio: 5 mg/g creat (ref 0–29)
Microalbumin, Urine: 6.2 ug/mL

## 2022-04-29 DIAGNOSIS — M25551 Pain in right hip: Secondary | ICD-10-CM | POA: Diagnosis not present

## 2022-05-19 DIAGNOSIS — M25551 Pain in right hip: Secondary | ICD-10-CM | POA: Diagnosis not present

## 2022-05-27 ENCOUNTER — Ambulatory Visit (INDEPENDENT_AMBULATORY_CARE_PROVIDER_SITE_OTHER): Payer: BC Managed Care – PPO | Admitting: Nurse Practitioner

## 2022-05-27 ENCOUNTER — Encounter: Payer: Self-pay | Admitting: Nurse Practitioner

## 2022-05-27 VITALS — BP 119/73 | HR 65 | Temp 97.4°F | Resp 20 | Ht 65.0 in | Wt 291.0 lb

## 2022-05-27 DIAGNOSIS — N907 Vulvar cyst: Secondary | ICD-10-CM

## 2022-05-27 MED ORDER — SULFAMETHOXAZOLE-TRIMETHOPRIM 800-160 MG PO TABS
1.0000 | ORAL_TABLET | Freq: Two times a day (BID) | ORAL | 0 refills | Status: DC
Start: 2022-05-27 — End: 2022-07-01

## 2022-05-27 NOTE — Progress Notes (Signed)
   Subjective:    Patient ID: Rachael Allen, female    DOB: 25-Jan-1971, 51 y.o.   MRN: [id number]   Chief Complaint: Spot in inside of vaginal area   HPI Place on the inside of her vaginal. Mildly painful. Sore to touch.    Review of Systems  Constitutional:  Negative for diaphoresis.  Eyes:  Negative for pain.  Respiratory:  Negative for shortness of breath.   Cardiovascular:  Negative for chest pain, palpitations and leg swelling.  Gastrointestinal:  Negative for abdominal pain.  Endocrine: Negative for polydipsia.  Skin:  Negative for rash.  Neurological:  Negative for dizziness, weakness and headaches.  Hematological:  Does not bruise/bleed easily.  All other systems reviewed and are negative.      Objective:   Physical Exam Constitutional:      Appearance: Normal appearance. She is obese.  Cardiovascular:     Rate and Rhythm: Normal rate and regular rhythm.  Pulmonary:     Effort: Pulmonary effort is normal.     Breath sounds: Normal breath sounds.  Genitourinary:    General: Normal vulva.     Vagina: No vaginal discharge.     Comments: Tender nodule right upper labia.  No drainage No erythema  Skin:    General: Skin is warm.  Neurological:     General: No focal deficit present.     Mental Status: She is alert and oriented to person, place, and time.     BP 119/73   Pulse 65   Temp (!) 97.4 F (36.3 C) (Temporal)   Resp 20   Ht '5\' 5"'$  (1.651 m)   Wt 291 lb (132 kg)   SpO2 97%   BMI 48.42 kg/m        Assessment & Plan:  Rachael Allen in today with chief complaint of Spot in inside of vaginal area   1. Labial cyst Continue epsom salt soaks Rto if not resolving  Meds ordered this encounter  Medications   sulfamethoxazole-trimethoprim (BACTRIM DS) 800-160 MG tablet    Sig: Take 1 tablet by mouth 2 (two) times daily.    Dispense:  20 tablet    Refill:  0    Order Specific Question:   Supervising Provider    Answer:   Caryl Pina  A [[id number]]       The above assessment and management plan was discussed with the patient. The patient verbalized understanding of and has agreed to the management plan. Patient is aware to call the clinic if symptoms persist or worsen. Patient is aware when to return to the clinic for a follow-up visit. Patient educated on when it is appropriate to go to the emergency department.   Mary-Margaret Hassell Done, FNP

## 2022-06-03 ENCOUNTER — Telehealth: Payer: Self-pay | Admitting: Nurse Practitioner

## 2022-06-03 DIAGNOSIS — B379 Candidiasis, unspecified: Secondary | ICD-10-CM

## 2022-06-03 MED ORDER — FLUCONAZOLE 150 MG PO TABS: ORAL_TABLET | ORAL | 0 refills | Status: DC

## 2022-06-03 NOTE — Telephone Encounter (Signed)
Patient aware.

## 2022-06-03 NOTE — Telephone Encounter (Signed)
Rx sent 

## 2022-06-03 NOTE — Telephone Encounter (Signed)
Patient was seen on 6/15 with Rachael Allen and given sulfamethoxazole-trimethoprim (BACTRIM DS) 800-160 MG tablet, she thinks that she has a yeast infection from the antibiotic and would like to [facility] [place]. Please call back to let her know if this can be done.

## 2022-06-17 ENCOUNTER — Encounter: Payer: Self-pay | Admitting: *Deleted

## 2022-06-23 DIAGNOSIS — M25551 Pain in right hip: Secondary | ICD-10-CM | POA: Diagnosis not present

## 2022-06-25 ENCOUNTER — Ambulatory Visit (INDEPENDENT_AMBULATORY_CARE_PROVIDER_SITE_OTHER): Payer: BC Managed Care – PPO | Admitting: Nurse Practitioner

## 2022-06-25 ENCOUNTER — Encounter: Payer: Self-pay | Admitting: Nurse Practitioner

## 2022-06-25 VITALS — BP 120/77 | HR 91 | Temp 97.8°F | Ht 65.0 in | Wt 293.8 lb

## 2022-06-25 DIAGNOSIS — S0006XA Insect bite (nonvenomous) of scalp, initial encounter: Secondary | ICD-10-CM

## 2022-06-25 DIAGNOSIS — S70362A Insect bite (nonvenomous), left thigh, initial encounter: Secondary | ICD-10-CM

## 2022-06-25 DIAGNOSIS — W57XXXA Bitten or stung by nonvenomous insect and other nonvenomous arthropods, initial encounter: Secondary | ICD-10-CM | POA: Diagnosis not present

## 2022-06-25 MED ORDER — HYDROCORTISONE 1 % EX LOTN: 1.0000 | TOPICAL_LOTION | Freq: Two times a day (BID) | CUTANEOUS | 0 refills | Status: DC

## 2022-06-25 MED ORDER — PREDNISONE 20 MG PO TABS: 20.0000 mg | ORAL_TABLET | Freq: Every day | ORAL | 0 refills | Status: DC

## 2022-06-25 NOTE — Progress Notes (Signed)
Acute Office Visit  Subjective:     Patient ID: Rachael Allen, female    DOB: 08-04-1971, 51 y.o.   MRN: [id number]  Chief Complaint  Patient presents with   skin check    Left thigh , been there about 3-4 days - has gotten bigger    Rash This is a new problem. The current episode started yesterday. The problem has been gradually worsening since onset. Location: left upper thigh. The rash is characterized by redness and itchiness. It is unknown if there was an exposure to a precipitant. Pertinent negatives include no congestion, cough, fatigue, fever, shortness of breath or sore throat. Past treatments include nothing.     Review of Systems  Constitutional:  Negative for fatigue and fever.  HENT:  Negative for congestion and sore throat.   Eyes: Negative.   Respiratory: Negative.  Negative for cough and shortness of breath.   Cardiovascular: Negative.   Gastrointestinal: Negative.   Genitourinary: Negative.   Skin:  Positive for rash.        Objective:    BP 120/77   Pulse 91   Temp 97.8 F (36.6 C)   Ht '5\' 5"'$  (1.651 m)   Wt 293 lb 12.8 oz (133.3 kg)   SpO2 96%   BMI 48.89 kg/m  BP Readings from Last 3 Encounters:  06/25/22 120/77  05/27/22 119/73  02/15/22 133/85   Wt Readings from Last 3 Encounters:  06/25/22 293 lb 12.8 oz (133.3 kg)  05/27/22 291 lb (132 kg)  02/15/22 293 lb (132.9 kg)      Physical Exam Vitals and nursing note reviewed.  Constitutional:      Appearance: Normal appearance.  HENT:     Head: Normocephalic.     Right Ear: External ear normal.     Left Ear: External ear normal.     Nose: Nose normal.  Eyes:     Conjunctiva/sclera: Conjunctivae normal.  Cardiovascular:     Rate and Rhythm: Normal rate and regular rhythm.     Pulses: Normal pulses.     Heart sounds: Normal heart sounds.  Pulmonary:     Effort: Pulmonary effort is normal.     Breath sounds: Normal breath sounds.  Abdominal:     General: Bowel sounds are  normal.  Skin:    General: Skin is warm.     Findings: Erythema and rash present.  Neurological:     General: No focal deficit present.     Mental Status: She is alert and oriented to person, place, and time.  Psychiatric:        Behavior: Behavior normal.     No results found for any visits on 06/25/22.      Assessment & Plan:  Patient is a 51 year old female who presents with a rash/insect bite on her left upper thigh.  Patient is not quite sure what exactly bit her she woke up with that red spot.  No fever, nausea associated with symptoms. Advised patient not to scratch with nails, apply hydrocortisone cream, prednisone 20 mg tablet by mouth daily with breakfast.  Keep skin moisturized and follow-up with worsening or unresolved symptoms. Problem List Items Addressed This Visit   None Visit Diagnoses     Insect bite of scalp, initial encounter    -  Primary   Relevant Medications   predniSONE (DELTASONE) 20 MG tablet   hydrocortisone 1 % lotion       Meds ordered this encounter  Medications  predniSONE (DELTASONE) 20 MG tablet    Sig: Take 1 tablet (20 mg total) by mouth daily with breakfast.    Dispense:  6 tablet    Refill:  0    Order Specific Question:   Supervising Provider    Answer:   Jeneen Rinks   hydrocortisone 1 % lotion    Sig: Apply 1 Application topically 2 (two) times daily.    Dispense:  118 mL    Refill:  0    Order Specific Question:   Supervising Provider    Answer:   Claretta Fraise [[id number]]    Return if symptoms worsen or fail to improve.  Ivy Lynn, NP

## 2022-06-25 NOTE — Patient Instructions (Signed)
Insect Bite, Adult An insect bite can make your skin red, itchy, and swollen. Some insects can spread disease to people with a bite. However, most insect bites do not lead to disease, and most are not serious. What are the causes? Insects may bite for many reasons, including: Hunger. To defend themselves. Insects that bite include: Spiders. Mosquitoes. Ticks. Fleas. Ants. Flies. Kissing bugs. Chiggers. What are the signs or symptoms? Symptoms of this condition include: Itching or pain in the bite area. Redness and swelling in the bite area. An open wound (skin ulcer). Symptoms often last for 2-4 days. In rare cases, a person may have a very bad allergic reaction (anaphylactic reaction) to a bite. Symptoms of an anaphylactic reaction may include: Feeling warm in the face (flushed). Your face may turn red. Itchy, red, swollen areas of skin (hives). Swelling of the: Eyes. Lips. Face. Mouth. Tongue. Throat. Trouble with any of these: Breathing. Talking. Swallowing. Loud breathing (wheezing). Feeling dizzy or light-headed. Passing out (fainting). Pain or cramps in your belly. Throwing up (vomiting). Watery poop (diarrhea). How is this treated? Treatment is usually not needed. Symptoms often go away on their own. When treatment is needed, it may involve: Putting a cream or lotion on the bite area. This helps with itching. Taking an antibiotic medicine. This treatment is needed if the bite area gets infected. Getting a tetanus shot, if you are not up to date on this vaccine. Putting ice on the affected area. Using medicines called antihistamines. This treatment may be needed if you have itching or an allergic reaction to the insect bite. Giving yourself a shot of medicine (epinephrine) using an auto-injector "pen" if you have an anaphylactic reaction to a bite. Your doctor will teach you how to use this pen. Follow these instructions at home: Bite area care  Do not  scratch the bite area. Keep the bite area clean and dry. Wash the bite area every day with soap and water as told by your doctor. Check the bite area every day for signs of infection. Check for: Redness, swelling, or pain. Fluid or blood. Warmth. Pus or a bad smell. Managing pain, itching, and swelling  You may put any of these on the bite area as told by your doctor: A paste made of baking soda and water. Cortisone cream. Calamine lotion. If told, put ice on the bite area. Put ice in a plastic bag. Place a towel between your skin and the bag. Leave the ice on for 20 minutes, 2-3 times a day. General instructions Apply or take over-the-counter and prescription medicines only as told by your doctor. If you were prescribed an antibiotic medicine, take or apply it as told by your doctor. Do not stop using the antibiotic even if your condition improves. Keep all follow-up visits as told by your doctor. This is important. How is this prevented? To help you have a lower risk of insect bites: When you are outside, wear clothing that covers your arms and legs. Use insect repellent. The best insect repellents contain one of these: DEET. Picaridin. Oil of lemon eucalyptus (OLE). [id number]. Consider spraying your clothing with a pesticide called permethrin. Permethrin helps prevent insect bites. It works for several weeks and for up to 5-6 clothing washes. Do not apply permethrin directly to the skin. If your home windows do not have screens, think about putting some in. If you will be sleeping in an area where there are mosquitoes, consider covering your sleeping area with a mosquito  net. Contact a doctor if: You have redness, swelling, or pain in the bite area. You have fluid or blood coming from the bite area. The bite area feels warm to the touch. You have pus or a bad smell coming from the bite area. You have a fever. Get help right away if: You have joint pain. You have a rash. You  feel more tired or sleepy than you normally do. You have neck pain. You have a headache. You feel weaker than you normally do. You have signs of an anaphylactic reaction. Signs may include: Feeling warm in the face. Itchy, red, swollen areas of skin. Swelling of your: Eyes. Lips. Face. Mouth. Tongue. Throat. Trouble with any of these: Breathing. Talking. Swallowing. Loud breathing. Feeling dizzy or light-headed. Passing out. Pain or cramps in your belly. Throwing up. Watery poop. These symptoms may be an emergency. Do not wait to see if the symptoms will go away. Do this right away: Use your auto-injector pen as you have been told. Get medical help. Call your local emergency services (911 in the [place] Do not drive yourself to the hospital. Summary An insect bite can make your skin red, itchy, and swollen. Treatment is usually not needed. Symptoms often go away on their own. Do not scratch the bite area. Keep it clean and dry. Ice can help with pain and itching from the bite. This information is not intended to replace advice given to you by your health care provider. Make sure you discuss any questions you have with your health care provider. Document Revised: 08/31/2021 Document Reviewed: 08/31/2021 Elsevier Patient Education  [place].

## 2022-07-01 ENCOUNTER — Encounter: Payer: Self-pay | Admitting: Nurse Practitioner

## 2022-07-01 ENCOUNTER — Ambulatory Visit (INDEPENDENT_AMBULATORY_CARE_PROVIDER_SITE_OTHER): Payer: BC Managed Care – PPO | Admitting: Nurse Practitioner

## 2022-07-01 MED ORDER — WEGOVY 0.25 MG/0.5ML ~~LOC~~ SOAJ: 0.2500 mg | SUBCUTANEOUS | 1 refills | Status: DC

## 2022-07-01 NOTE — Progress Notes (Deleted)
   Subjective:    Patient ID: Rachael Allen, female    DOB: 1971/02/10, 51 y.o.   MRN: [id number]   Chief Complaint: Discuss weight loss    HPI:  Rachael Allen is a 51 y.o. who identifies as a female who was assigned female at birth.   Social history: Lives with: *** Work history: ***   Comes in today for follow up of the following chronic medical issues:  1. Primary hypertension ***  2. Diverticulosis ***  3. Fibromyalgia muscle pain ***  4. Recurrent major depressive disorder, in full remission (HCC) ***  5. Bipolar affective disorder, current episode mixed, current episode severity unspecified (HCC) ***  6. Primary narcolepsy with cataplexy ***  7. Intractable chronic migraine without aura and without status migrainosus ***  8. Morbid obesity (HCC) ***   New complaints: ***  Allergies  Allergen Reactions   Nitrofurantoin Other (See Comments)    SEVERE HEADACHE   Benadryl [Diphenhydramine Hcl]    Dilaudid [Hydromorphone Hcl]    Latex    Nsaids Nausea Only    In large doses   Vicodin [Hydrocodone-Acetaminophen]    Outpatient Encounter Medications as of 07/01/2022  Medication Sig   [facility] B-12 500 MCG tablet Take 500 mcg by mouth daily.   hydrocortisone 1 % lotion Apply 1 Application topically 2 (two) times daily.   lisinopril (ZESTRIL) 20 MG tablet Take 1 tablet (20 mg total) by mouth daily.   lurasidone (LATUDA) 40 MG TABS tablet Take 1 tablet (40 mg total) by mouth daily with breakfast. (Patient taking differently: Take 40 mg by mouth daily.)   metFORMIN (GLUCOPHAGE-XR) 500 MG 24 hr tablet Take 1 tablet (500 mg total) by mouth 2 (two) times daily.   valACYclovir (VALTREX) 1000 MG tablet TAKE 2 TABLETS TWICE A DAY BY MOUTH FOR 1 DAY THEN AS NEEDED FOR FEVER BLISTERS   [DISCONTINUED] predniSONE (DELTASONE) 20 MG tablet Take 1 tablet (20 mg total) by mouth daily with breakfast.   [DISCONTINUED] sulfamethoxazole-trimethoprim (BACTRIM DS) 800-160 MG  tablet Take 1 tablet by mouth 2 (two) times daily.   [DISCONTINUED] cyclobenzaprine (FLEXERIL) 10 MG tablet Take 1 tablet (10 mg total) by mouth 3 (three) times daily as needed for muscle spasms.   [DISCONTINUED] fluconazole (DIFLUCAN) 150 MG tablet Take one tablet by mouth now. May repeat in 3 days if symptoms persist.   No facility-administered encounter medications on file as of 07/01/2022.    Past Surgical History:  Procedure Laterality Date   CESAREAN SECTION     TONSILLECTOMY AND ADENOIDECTOMY      Family History  Problem Relation Age of Onset   Breast cancer Mother 49   Hypertension Mother    Mental illness Father    Hypertension Sister       Controlled substance contract: ***     Review of Systems     Objective:   Physical Exam        Assessment & Plan:

## 2022-07-01 NOTE — Patient Instructions (Signed)
Semaglutide Injection (Weight Management) ?What is this medication? ?SEMAGLUTIDE (SEM a GLOO tide) promotes weight loss. It may also be used to maintain weight loss. It works by decreasing appetite. Changes to diet and exercise are often combined with this medication. ?This medicine may be used for other purposes; ask your health care provider or pharmacist if you have questions. ?COMMON BRAND NAME(S): Wegovy ?What should I tell my care team before I take this medication? ?They need to know if you have any of these conditions: ?Endocrine tumors (MEN 2) or if someone in your family had these tumors ?Eye disease, vision problems ?Gallbladder disease ?History of depression or mental health disease ?History of pancreatitis ?Kidney disease ?Stomach or intestine problems ?Suicidal thoughts, plans, or attempt; a previous suicide attempt by you or a family member ?Thyroid cancer or if someone in your family had thyroid cancer ?An unusual or allergic reaction to semaglutide, other medications, foods, dyes, or preservatives ?Pregnant or trying to get pregnant ?Breast-feeding ?How should I use this medication? ?This medication is injected under the skin. You will be taught how to prepare and give it. Take it as directed on the prescription label. It is given once every week (every 7 days). Keep taking it unless your care team tells you to stop. ?It is important that you put your used needles and pens in a special sharps container. Do not put them in a trash can. If you do not have a sharps container, call your pharmacist or care team to get one. ?A special MedGuide will be given to you by the pharmacist with each prescription and refill. Be sure to read this information carefully each time. ?This medication comes with INSTRUCTIONS FOR USE. Ask your pharmacist for directions on how to use this medication. Read the information carefully. Talk to your pharmacist or care team if you have questions. ?Talk to your care team about  the use of this medication in children. While it may be prescribed for children as young as 12 years for selected conditions, precautions do apply. ?Overdosage: If you think you have taken too much of this medicine contact a poison control center or emergency room at once. ?NOTE: This medicine is only for you. Do not share this medicine with others. ?What if I miss a dose? ?If you miss a dose and the next scheduled dose is more than 2 days away, take the missed dose as soon as possible. If you miss a dose and the next scheduled dose is less than 2 days away, do not take the missed dose. Take the next dose at your regular time. Do not take double or extra doses. If you miss your dose for 2 weeks or more, take the next dose at your regular time or call your care team to talk about how to restart this medication. ?What may interact with this medication? ?Insulin and other medications for diabetes ?This list may not describe all possible interactions. Give your health care provider a list of all the medicines, herbs, non-prescription drugs, or dietary supplements you use. Also tell them if you smoke, drink alcohol, or use illegal drugs. Some items may interact with your medicine. ?What should I watch for while using this medication? ?Visit your care team for regular checks on your progress. It may be some time before you see the benefit from this medication. ?Drink plenty of fluids while taking this medication. Check with your care team if you have severe diarrhea, nausea, and vomiting, or if you sweat a   lot. The loss of too much body fluid may make it dangerous for you to take this medication. ?This medication may affect blood sugar levels. Ask your care team if changes in diet or medications are needed if you have diabetes. ?If you or your family notice any changes in your behavior, such as new or worsening depression, thoughts of harming yourself, anxiety, other unusual or disturbing thoughts, or memory loss, call  your care team right away. ?Women should inform their care team if they wish to become pregnant or think they might be pregnant. Losing weight while pregnant is not advised and may cause harm to the unborn child. Talk to your care team for more information. ?What side effects may I notice from receiving this medication? ?Side effects that you should report to your care team as soon as possible: ?Allergic reactions--skin rash, itching, hives, swelling of the face, lips, tongue, or throat ?Change in vision ?Dehydration--increased thirst, dry mouth, feeling faint or lightheaded, headache, dark yellow or brown urine ?Gallbladder problems--severe stomach pain, nausea, vomiting, fever ?Heart palpitations--rapid, pounding, or irregular heartbeat ?Kidney injury--decrease in the amount of urine, swelling of the ankles, hands, or feet ?Pancreatitis--severe stomach pain that spreads to your back or gets worse after eating or when touched, fever, nausea, vomiting ?Thoughts of suicide or self-harm, worsening mood, feelings of depression ?Thyroid cancer--new mass or lump in the neck, pain or trouble swallowing, trouble breathing, hoarseness ?Side effects that usually do not require medical attention (report to your care team if they continue or are bothersome): ?Diarrhea ?Loss of appetite ?Nausea ?Stomach pain ?Vomiting ?This list may not describe all possible side effects. Call your doctor for medical advice about side effects. You may report side effects to FDA at 1-800-FDA-1088. ?Where should I keep my medication? ?Keep out of the reach of children and pets. ?Refrigeration (preferred): Store in the refrigerator. Do not freeze. Keep this medication in the original container until you are ready to take it. Get rid of any unused medication after the expiration date. ?Room temperature: If needed, prior to cap removal, the pen can be stored at room temperature for up to 28 days. Protect from light. If it is stored at room  temperature, get rid of any unused medication after 28 days or after it expires, whichever is first. ?It is important to get rid of the medication as soon as you no longer need it or it is expired. You can do this in two ways: ?Take the medication to a medication take-back program. Check with your pharmacy or law enforcement to find a location. ?If you cannot return the medication, follow the directions in the MedGuide. ?NOTE: This sheet is a summary. It may not cover all possible information. If you have questions about this medicine, talk to your doctor, pharmacist, or health care provider. ?? 2023 Elsevier/Gold Standard (2021-12-16 00:00:00) ? ?

## 2022-07-01 NOTE — Progress Notes (Signed)
Subjective:    Patient ID: Rachael Allen, female    DOB: [birth year], 51 y.o.   MRN: [id number]   Chief Complaint: Discuss weight loss   HPI Patient in today to discuss weight loss. She has tried everything in the past. She has done weght watchers, keto and low calorie. She will loose at first then stops and gains it back. Sh ehas a lot of pain with her fibromyalgia and hip pain and weight loss will help.    Review of Systems  Constitutional:  Negative for diaphoresis.  Eyes:  Negative for pain.  Respiratory:  Negative for shortness of breath.   Cardiovascular:  Negative for chest pain, palpitations and leg swelling.  Gastrointestinal:  Negative for abdominal pain.  Endocrine: Negative for polydipsia.  Skin:  Negative for rash.  Neurological:  Negative for dizziness, weakness and headaches.  Hematological:  Does not bruise/bleed easily.  All other systems reviewed and are negative.      Objective:   Physical Exam Vitals and nursing note reviewed.  Constitutional:      General: She is not in acute distress.    Appearance: Normal appearance. She is well-developed.  HENT:     Head: Normocephalic.     Right Ear: Tympanic membrane normal.     Left Ear: Tympanic membrane normal.     Nose: Nose normal.     Mouth/Throat:     Mouth: Mucous membranes are moist.  Eyes:     Pupils: Pupils are equal, round, and reactive to light.  Neck:     Vascular: No carotid bruit or JVD.  Cardiovascular:     Rate and Rhythm: Normal rate and regular rhythm.     Heart sounds: Normal heart sounds.  Pulmonary:     Effort: Pulmonary effort is normal. No respiratory distress.     Breath sounds: Normal breath sounds. No wheezing or rales.  Chest:     Chest wall: No tenderness.  Abdominal:     General: Bowel sounds are normal. There is no distension or abdominal bruit.     Palpations: Abdomen is soft. There is no hepatomegaly, splenomegaly, mass or pulsatile mass.     Tenderness: There is no  abdominal tenderness.  Musculoskeletal:        General: Normal range of motion.     Cervical back: Normal range of motion and neck supple.  Lymphadenopathy:     Cervical: No cervical adenopathy.  Skin:    General: Skin is warm and dry.  Neurological:     Mental Status: She is alert and oriented to person, place, and time.     Deep Tendon Reflexes: Reflexes are normal and symmetric.  Psychiatric:        Behavior: Behavior normal.        Thought Content: Thought content normal.        Judgment: Judgment normal.    BP 125/77   Pulse 60   Temp 97.9 F (36.6 C) (Temporal)   Resp 20   Ht '5\' 5"'$  (1.651 m)   Wt 294 lb (133.4 kg)   SpO2 92%   BMI 48.92 kg/m         Assessment & Plan:  Rachael Allen in today with chief complaint of Discuss weight loss   1. Morbid obesity ([place]) Discussed wegovy- inability to get currently due to back order,. She wants to go ahead and send in prescription for prior auth, so she can get when comes avaialbe Continue to watch calories  daily Daily exercise encourgaed.    The above assessment and management plan was discussed with the patient. The patient verbalized understanding of and has agreed to the management plan. Patient is aware to call the clinic if symptoms persist or worsen. Patient is aware when to return to the clinic for a follow-up visit. Patient educated on when it is appropriate to go to the emergency department.   Mary-Margaret Hassell Done, FNP

## 2022-07-06 ENCOUNTER — Telehealth: Payer: Self-pay

## 2022-07-06 ENCOUNTER — Other Ambulatory Visit: Payer: Self-pay | Admitting: Physician Assistant

## 2022-07-06 DIAGNOSIS — M25551 Pain in right hip: Secondary | ICD-10-CM

## 2022-07-06 NOTE — Telephone Encounter (Signed)
(  Key: [id number])  This request has been approved.  Please note any additional information provided by [facility] at the bottom of your screen.  [facility] [place] made aware.

## 2022-07-13 ENCOUNTER — Other Ambulatory Visit: Payer: BC Managed Care – PPO

## 2022-07-15 DIAGNOSIS — Z1211 Encounter for screening for malignant neoplasm of colon: Secondary | ICD-10-CM | POA: Diagnosis not present

## 2022-07-15 DIAGNOSIS — D124 Benign neoplasm of descending colon: Secondary | ICD-10-CM | POA: Diagnosis not present

## 2022-07-15 DIAGNOSIS — D122 Benign neoplasm of ascending colon: Secondary | ICD-10-CM | POA: Diagnosis not present

## 2022-07-15 DIAGNOSIS — Z86010 Personal history of colonic polyps: Secondary | ICD-10-CM | POA: Diagnosis not present

## 2022-07-15 DIAGNOSIS — F32A Depression, unspecified: Secondary | ICD-10-CM | POA: Diagnosis not present

## 2022-07-15 DIAGNOSIS — Z09 Encounter for follow-up examination after completed treatment for conditions other than malignant neoplasm: Secondary | ICD-10-CM | POA: Diagnosis not present

## 2022-07-28 ENCOUNTER — Other Ambulatory Visit: Payer: Self-pay | Admitting: Nurse Practitioner

## 2022-07-28 DIAGNOSIS — M25851 Other specified joint disorders, right hip: Secondary | ICD-10-CM | POA: Insufficient documentation

## 2022-07-30 ENCOUNTER — Encounter: Payer: Self-pay | Admitting: *Deleted

## 2022-07-30 DIAGNOSIS — S73101A Unspecified sprain of right hip, initial encounter: Secondary | ICD-10-CM | POA: Diagnosis not present

## 2022-07-30 DIAGNOSIS — S338XXA Sprain of other parts of lumbar spine and pelvis, initial encounter: Secondary | ICD-10-CM | POA: Diagnosis not present

## 2022-08-05 ENCOUNTER — Encounter: Payer: Self-pay | Admitting: Nurse Practitioner

## 2022-08-05 ENCOUNTER — Telehealth: Payer: Self-pay | Admitting: Nurse Practitioner

## 2022-08-05 ENCOUNTER — Ambulatory Visit (INDEPENDENT_AMBULATORY_CARE_PROVIDER_SITE_OTHER): Payer: BC Managed Care – PPO | Admitting: Nurse Practitioner

## 2022-08-05 VITALS — BP 138/91 | HR 66 | Temp 97.5°F | Resp 20 | Ht 65.0 in | Wt 297.0 lb

## 2022-08-05 DIAGNOSIS — M797 Fibromyalgia: Secondary | ICD-10-CM

## 2022-08-05 DIAGNOSIS — S73101A Unspecified sprain of right hip, initial encounter: Secondary | ICD-10-CM | POA: Diagnosis not present

## 2022-08-05 DIAGNOSIS — S338XXA Sprain of other parts of lumbar spine and pelvis, initial encounter: Secondary | ICD-10-CM | POA: Diagnosis not present

## 2022-08-05 MED ORDER — METHYLPREDNISOLONE ACETATE 80 MG/ML IJ SUSP
80.0000 mg | Freq: Once | INTRAMUSCULAR | Status: AC
  Administered 2022-08-05: 80 mg via INTRAMUSCULAR

## 2022-08-05 MED ORDER — KETOROLAC TROMETHAMINE 60 MG/2ML IM SOLN
60.0000 mg | Freq: Once | INTRAMUSCULAR | Status: AC
  Administered 2022-08-05: 60 mg via INTRAMUSCULAR

## 2022-08-05 NOTE — Telephone Encounter (Signed)
Patient just wanted to let us know that she received a letter from her disability and it states that they are reviewing her case and we may receive additional forms from them. If we do she asks that we give her a call and notify her.

## 2022-08-05 NOTE — Patient Instructions (Signed)
Myofascial Pain Syndrome and Fibromyalgia ?Myofascial pain syndrome and fibromyalgia are both pain disorders. You may feel this pain mainly in your muscles. ?Myofascial pain syndrome: ?Always has tender points in the muscles that will cause pain when pressed (trigger points). The pain may come and go. ?Usually affects your neck, upper back, and shoulder areas. The pain often moves into your arms and hands. ?Fibromyalgia: ?Has muscle pains and tenderness that come and go. ?Is often associated with tiredness (fatigue) and sleep problems. ?Has trigger points. ?Tends to be long-lasting (chronic), but is not life-threatening. ?Fibromyalgia and myofascial pain syndrome are not the same. However, they often occur together. If you have both conditions, each can make the other worse. Both are common and can cause enough pain and fatigue to make day-to-day activities difficult. Both can be hard to diagnose because their symptoms are common in many other conditions. ?What are the causes? ?The exact causes of these conditions are not known. ?What increases the risk? ?You are more likely to develop either of these conditions if: ?You have a family history of the condition. ?You are female. ?You have certain triggers, such as: ?Spine disorders. ?An injury (trauma) or other physical stressors. ?Being under a lot of stress. ?Medical conditions such as osteoarthritis, rheumatoid arthritis, or lupus. ?What are the signs or symptoms? ?Fibromyalgia ?The main symptom of fibromyalgia is widespread pain and tenderness in your muscles. Pain is sometimes described as stabbing, shooting, or burning. ?You may also have: ?Tingling or numbness. ?Sleep problems and fatigue. ?Problems with attention and concentration (fibro fog). ?Other symptoms may include: ?Bowel and bladder problems. ?Headaches. ?Vision problems. ?Sensitivity to odors and noises. ?Depression or mood changes. ?Painful menstrual periods (dysmenorrhea). ?Dry skin or eyes. ?These  symptoms can vary over time. ?Myofascial pain syndrome ?Symptoms of myofascial pain syndrome include: ?Tight, ropy bands of muscle. ?Uncomfortable sensations in muscle areas. These may include aching, cramping, burning, numbness, tingling, and weakness. ?Difficulty moving certain parts of the body freely (poor range of motion). ?How is this diagnosed? ?This condition may be diagnosed by your symptoms and medical history. You will also have a physical exam. In general: ?Fibromyalgia is diagnosed if you have pain, fatigue, and other symptoms for more than 3 months, and symptoms cannot be explained by another condition. ?Myofascial pain syndrome is diagnosed if you have trigger points in your muscles, and those trigger points are tender and cause pain elsewhere in your body (referred pain). ?How is this treated? ?Treatment for these conditions depends on the type that you have. ?For fibromyalgia a healthy lifestyle is the most important treatment including aerobic and strength exercises. Different types of medicines are used to help treat pain and include: ?NSAIDs. ?Medicines for treating depression. ?Medicines that help control seizures. ?Medicines that relax the muscles. ?Treatment for myofascial pain syndrome includes: ?Pain medicines, such as NSAIDs. ?Cooling and stretching of muscles. ?Massage therapy with myofascial release technique. ?Trigger point injections. ?Treating these conditions often requires a team of health care providers. These may include: ?Your primary care provider. ?A physical therapist. ?Complementary health care providers, such as massage therapists or acupuncturists. ?A psychiatrist for cognitive behavioral therapy. ?Follow these instructions at home: ?Medicines ?Take over-the-counter and prescription medicines only as told by your health care provider. ?Ask your health care provider if the medicine prescribed to you: ?Requires you to avoid driving or using machinery. ?Can cause constipation.  You may need to take these actions to prevent or treat constipation: ?Drink enough fluid to keep your urine pale   yellow. ?Take over-the-counter or prescription medicines. ?Eat foods that are high in fiber, such as beans, whole grains, and fresh fruits and vegetables. ?Limit foods that are high in fat and processed sugars, such as fried or sweet foods. ?Lifestyle ? ?Do exercises as told by your health care provider or physical therapist. ?Practice relaxation techniques to control your stress. You may want to try: ?Biofeedback. ?Visual imagery. ?Hypnosis. ?Muscle relaxation. ?Yoga. ?Meditation. ?Maintain a healthy lifestyle. This includes eating a healthy diet and getting enough sleep. ?Do not use any products that contain nicotine or tobacco. These products include cigarettes, chewing tobacco, and vaping devices, such as e-cigarettes. If you need help quitting, ask your health care provider. ?General instructions ?Talk to your health care provider about complementary treatments, such as acupuncture or massage. ?Do not do activities that stress or strain your muscles. This includes repetitive motions and heavy lifting. ?Keep all follow-up visits. This is important. ?Where to find support ?Consider joining a support group with others who are diagnosed with this condition. ?National Fibromyalgia Association: www.fmaware.org ?Where to find more information ?American Chronic Pain Association: www.theacpa.org ?Contact a health care provider if: ?You have new symptoms. ?Your symptoms get worse or your pain is severe. ?You have side effects from your medicines. ?You have trouble sleeping. ?Your condition is causing depression or anxiety. ?Get help right away if: ?You have thoughts of hurting yourself or others. ?Get help right awayif you feel like you may hurt yourself or others, or have thoughts about taking your own life. Go to your nearest emergency room or: ?Call 911. ?Call the National Suicide Prevention Lifeline at  1-800-273-8255 or 988. This is open 24 hours a day. ?Text the Crisis Text Line at 741741. ?Summary ?Myofascial pain syndrome and fibromyalgia are pain disorders. ?Myofascial pain syndrome has tender points in the muscles that will cause pain when pressed (trigger points). Fibromyalgia also has muscle pains and tenderness that come and go, but this condition is often associated with fatigue and sleep disturbances. ?Fibromyalgia and myofascial pain syndrome are not the same but often occur together, causing pain and fatigue that make day-to-day activities difficult. ?Follow your health care provider's instructions for taking medicines and maintaining a healthy lifestyle. ?This information is not intended to replace advice given to you by your health care provider. Make sure you discuss any questions you have with your health care provider. ?Document Revised: 10/30/2021 Document Reviewed: 10/30/2021 ?Elsevier Patient Education ? 2023 Elsevier Inc. ? ?

## 2022-08-05 NOTE — Progress Notes (Signed)
   Subjective:    Patient ID: Rachael Allen, female    DOB: 1971/09/16, 51 y.o.   MRN: [id number]   Chief Complaint: Back Pain and Hip Pain (Right/Legs hurting all over/)   Back Pain Pertinent negatives include no abdominal pain, chest pain, headaches or weakness.  Hip Pain    Patient has long history of fibromyalgia. She comes in today c/o back pain, hip pain and leg pain. Started several weeks  ago. Rates pain 8/10  currently. Has seen chiropractor which has not really helped.she has U/S scheduled next week on hip.     Review of Systems  Constitutional:  Negative for diaphoresis.  Eyes:  Negative for pain.  Respiratory:  Negative for shortness of breath.   Cardiovascular:  Negative for chest pain, palpitations and leg swelling.  Gastrointestinal:  Negative for abdominal pain.  Endocrine: Negative for polydipsia.  Musculoskeletal:  Positive for arthralgias, back pain and myalgias.  Skin:  Negative for rash.  Neurological:  Negative for dizziness, weakness and headaches.  Hematological:  Does not bruise/bleed easily.  All other systems reviewed and are negative.      Objective:   Physical Exam Constitutional:      Appearance: Normal appearance.  Cardiovascular:     Rate and Rhythm: Normal rate and regular rhythm.     Heart sounds: Normal heart sounds.  Pulmonary:     Effort: Pulmonary effort is normal.     Breath sounds: Normal breath sounds.  Musculoskeletal:     Comments: Rises slowly form sitting  to standing Gait slow and steady.  No joint effusions.   Skin:    General: Skin is warm.  Neurological:     General: No focal deficit present.     Mental Status: She is alert and oriented to person, place, and time.  Psychiatric:        Mood and Affect: Mood normal.        Behavior: Behavior normal.    BP (!) 138/91   Pulse 66   Temp (!) 97.5 F (36.4 C) (Temporal)   Resp 20   Ht '5\' 5"'$  (1.651 m)   Wt 297 lb (134.7 kg)   SpO2 94%   BMI 49.42 kg/m          Assessment & Plan:  Rachael Allen in today with chief complaint of Back Pain and Hip Pain (Right/Legs hurting all over/)   1. Fibromyalgia muscle pain Moist heat rest - ketorolac (TORADOL) injection 60 mg - methylPREDNISolone acetate (DEPO-MEDROL) injection 80 mg    The above assessment and management plan was discussed with the patient. The patient verbalized understanding of and has agreed to the management plan. Patient is aware to call the clinic if symptoms persist or worsen. Patient is aware when to return to the clinic for a follow-up visit. Patient educated on when it is appropriate to go to the emergency department.   Mary-Margaret Hassell Done, FNP

## 2022-08-11 DIAGNOSIS — M25851 Other specified joint disorders, right hip: Secondary | ICD-10-CM | POA: Diagnosis not present

## 2022-08-19 ENCOUNTER — Ambulatory Visit: Payer: BC Managed Care – PPO | Admitting: Nurse Practitioner

## 2022-08-19 DIAGNOSIS — M5459 Other low back pain: Secondary | ICD-10-CM | POA: Diagnosis not present

## 2022-08-19 DIAGNOSIS — S338XXA Sprain of other parts of lumbar spine and pelvis, initial encounter: Secondary | ICD-10-CM | POA: Diagnosis not present

## 2022-08-19 DIAGNOSIS — S73101A Unspecified sprain of right hip, initial encounter: Secondary | ICD-10-CM | POA: Diagnosis not present

## 2022-08-19 DIAGNOSIS — M25551 Pain in right hip: Secondary | ICD-10-CM | POA: Diagnosis not present

## 2022-08-24 ENCOUNTER — Ambulatory Visit: Payer: BC Managed Care – PPO | Admitting: Nurse Practitioner

## 2022-08-27 ENCOUNTER — Encounter: Payer: Self-pay | Admitting: Nurse Practitioner

## 2022-08-27 ENCOUNTER — Ambulatory Visit (INDEPENDENT_AMBULATORY_CARE_PROVIDER_SITE_OTHER): Payer: BC Managed Care – PPO | Admitting: Nurse Practitioner

## 2022-08-27 VITALS — BP 111/70 | HR 70 | Temp 97.9°F | Resp 20 | Ht 65.0 in | Wt 297.0 lb

## 2022-08-27 DIAGNOSIS — G43719 Chronic migraine without aura, intractable, without status migrainosus: Secondary | ICD-10-CM | POA: Diagnosis not present

## 2022-08-27 DIAGNOSIS — K5790 Diverticulosis of intestine, part unspecified, without perforation or abscess without bleeding: Secondary | ICD-10-CM | POA: Diagnosis not present

## 2022-08-27 DIAGNOSIS — F3160 Bipolar disorder, current episode mixed, unspecified: Secondary | ICD-10-CM

## 2022-08-27 DIAGNOSIS — M25551 Pain in right hip: Secondary | ICD-10-CM | POA: Diagnosis not present

## 2022-08-27 DIAGNOSIS — I10 Essential (primary) hypertension: Secondary | ICD-10-CM

## 2022-08-27 DIAGNOSIS — M797 Fibromyalgia: Secondary | ICD-10-CM

## 2022-08-27 DIAGNOSIS — F3342 Major depressive disorder, recurrent, in full remission: Secondary | ICD-10-CM | POA: Diagnosis not present

## 2022-08-27 DIAGNOSIS — E119 Type 2 diabetes mellitus without complications: Secondary | ICD-10-CM | POA: Diagnosis not present

## 2022-08-27 DIAGNOSIS — E282 Polycystic ovarian syndrome: Secondary | ICD-10-CM

## 2022-08-27 DIAGNOSIS — M4802 Spinal stenosis, cervical region: Secondary | ICD-10-CM

## 2022-08-27 DIAGNOSIS — M5416 Radiculopathy, lumbar region: Secondary | ICD-10-CM | POA: Diagnosis not present

## 2022-08-27 LAB — BAYER DCA HB A1C WAIVED: HB A1C (BAYER DCA - WAIVED): 5.3 % (ref 4.8–5.6)

## 2022-08-27 MED ORDER — METFORMIN HCL ER 500 MG PO TB24: 500.0000 mg | ORAL_TABLET | Freq: Two times a day (BID) | ORAL | 1 refills | Status: DC

## 2022-08-27 MED ORDER — LISINOPRIL 20 MG PO TABS: 20.0000 mg | ORAL_TABLET | Freq: Every day | ORAL | 1 refills | Status: DC

## 2022-08-27 NOTE — Patient Instructions (Signed)
Diet for Polycystic Ovary Syndrome Polycystic ovary syndrome (PCOS) is a common hormonal disorder that affects a woman's reproductive system. It can cause problems with menstrual periods and make it hard to get and stay pregnant. Changing what you eat can help your hormones reach normal levels, improve your health, and help you better manage PCOS. Following a balanced diet can help you lose weight and improve the way that your body uses the hormone insulin to control blood sugar. This may include: Eating low-fat (lean) proteins, complex carbohydrates, fresh fruits and vegetables, low-fat dairy products, healthy fats, and fiber. Cutting down on calories. Exercising regularly. What are tips for following this plan? Follow a balanced diet for meals and snacks. Eat breakfast, lunch, dinner, and one or two snacks every day. Include protein in each meal and snack. Choose whole grains instead of products that are made with refined flour. Eat a variety of foods. Exercise regularly as told by your health care provider. Aim to do at least 30 minutes of exercise on most days of the week. If you are overweight or obese: Pay attention to how many calories you eat. Cutting down on calories can help you lose weight. Work with your health care provider or a dietitian to figure out how many calories you need each day. What foods should I eat?  Fruits Include a variety of colors and types. All fruits are helpful for PCOS. Vegetables Include a variety of colors and types. All vegetables are helpful for PCOS. Grains Whole grains, such as whole wheat. Whole-grain breads, crackers, cereals, and pasta. Unsweetened oatmeal. Bulgur, barley, quinoa, and brown rice. Tortillas made from corn or whole-wheat flour. Meats and other proteins Lean proteins, such as fish, chicken, beans, eggs, and tofu. Dairy Low-fat dairy products, such as skim milk, cheese sticks, and yogurt. Beverages Low-fat or fat-free drinks, such  as water, low-fat milk, sugar-free drinks, and small amounts of 100% fruit juice. Seasonings and condiments Ketchup. Mustard. Barbecue sauce. Relish. Low-fat or fat-free mayonnaise. Fats and oils Olive oil or canola oil. Walnuts and almonds. The items listed above may not be a complete list of recommended foods and beverages. Contact a dietitian for more options. What foods should I avoid? Foods that are high in calories or fat, especially saturated or trans fats. Fried foods. Sweets. Products that are made from refined white flour, including white bread, pastries, white rice, and pasta. The items listed above may not be a complete list of foods and beverages to avoid. Contact a dietitian for more information. Summary PCOS is a hormonal imbalance that affects a woman's reproductive system. It can cause problems with menstrual periods and make it hard to get and stay pregnant. You can help to manage your PCOS by exercising regularly and eating a healthy, varied diet of vegetables, fruit, whole grains, lean protein, and low-fat dairy products. Changing what you eat can improve the way that your body uses insulin, help your hormones reach normal levels, and help you lose weight. This information is not intended to replace advice given to you by your health care provider. Make sure you discuss any questions you have with your health care provider. Document Revised: 05/08/2020 Document Reviewed: 05/08/2020 Elsevier Patient Education  [employer].

## 2022-08-27 NOTE — Progress Notes (Signed)
 Subjective:    Patient ID: Rachael Allen, female    DOB: 03/03/1971, 50 y.o.   MRN: [id number]  Chief Complaint: medical management of chronic issues     HPI:  Rachael Allen is a 50 y.o. who identifies as a female who was assigned female at birth.   Social history: Lives with: husband Work history: disability   Comes in today for follow up of the following chronic medical issues:  1. Primary hypertension No c/o chest pain, sob or headache. Does not check blood pressure at home. BP Readings from Last 3 Encounters:  08/05/22 (!) 138/91  07/01/22 125/77  06/25/22 120/77     2. Intractable chronic migraine without aura and without status migrainosus Currently doing well. Has migraine 1x a quarter.  3. Diverticulosis Denies any recent flare ups  4. Recurrent major depressive disorder, in full remission (HCC) 5. Bipolar affective disorder, current episode mixed, current episode severity unspecified (HCC) Is on latuda which is working well for her with no side effects.    08/27/2022   11:47 AM 08/05/2022    9:12 AM 07/01/2022   12:09 PM  Depression screen PHQ 2/9  Decreased Interest 1 1 1  Down, Depressed, Hopeless 1 1 1  PHQ - 2 Score 2 2 2  Altered sleeping 0 0 0  Tired, decreased energy 1 1 1  Change in appetite 1 1 1  Feeling bad or failure about yourself  0 0 0  Trouble concentrating 0 0 0  Moving slowly or fidgety/restless 0 0 0  Suicidal thoughts 0 0 0  PHQ-9 Score 4 4 4  Difficult doing work/chores Somewhat difficult Somewhat difficult Somewhat difficult      08/27/2022   11:47 AM 08/05/2022    9:12 AM 07/01/2022   12:09 PM 05/27/2022   10:25 AM  GAD 7 : Generalized Anxiety Score  Nervous, Anxious, on Edge 1 1 1 0  Control/stop worrying 1 1 1 1  Worry too much - different things 1 1 1 1  Trouble relaxing 0 0 0 0  Restless 0 0 0 0  Easily annoyed or irritable 0 1 0 0  Afraid - awful might happen 1 1 1 1  Total GAD 7 Score 4 5 4 3  Anxiety  Difficulty Somewhat difficult Somewhat difficult Somewhat difficult Somewhat difficult      6. Cervical stenosis of spine Has chronic back pain but is on no pain medication at this time  7. Fibromyalgia Has pain daily. Some days are better then others. Pain increases with to much activity. Rest helps. Rates pain 7/10 today.   8. Morbid obesity (HCC) No recent weight changes Wt Readings from Last 3 Encounters:  08/27/22 297 lb (134.7 kg)  08/05/22 297 lb (134.7 kg)  07/01/22 294 lb (133.4 kg)   BMI Readings from Last 3 Encounters:  08/27/22 49.42 kg/m  08/05/22 49.42 kg/m  07/01/22 48.92 kg/m     New complaints: None today  Allergies  Allergen Reactions   Nitrofurantoin Other (See Comments)    SEVERE HEADACHE   Benadryl [Diphenhydramine Hcl]    Dilaudid [Hydromorphone Hcl]    Latex    Nsaids Nausea Only    In large doses   Vicodin [Hydrocodone-Acetaminophen]    Outpatient Encounter Medications as of 08/27/2022  Medication Sig   [facility] B-12 500 MCG tablet Take 500 mcg by mouth daily.   hydrocortisone 1 % lotion Apply 1 Application topically 2 (two) times daily.   lisinopril (  ZESTRIL) 20 MG tablet Take 1 tablet (20 mg total) by mouth daily.   lurasidone (LATUDA) 40 MG TABS tablet Take 1 tablet (40 mg total) by mouth daily with breakfast. (Patient taking differently: Take 40 mg by mouth daily.)   metFORMIN (GLUCOPHAGE-XR) 500 MG 24 hr tablet Take 1 tablet (500 mg total) by mouth 2 (two) times daily.   Semaglutide-Weight Management (WEGOVY) 0.25 MG/0.5ML SOAJ Inject 0.25 mg into the skin once a week.   valACYclovir (VALTREX) 1000 MG tablet TAKE 2 TABLETS TWICE A DAY BY MOUTH FOR 1 DAY THEN AS NEEDED FOR FEVER BLISTERS   No facility-administered encounter medications on file as of 08/27/2022.    Past Surgical History:  Procedure Laterality Date   CESAREAN SECTION     TONSILLECTOMY AND ADENOIDECTOMY      Family History  Problem Relation Age of Onset   Breast cancer  Mother 38   Hypertension Mother    Mental illness Father    Hypertension Sister       Controlled substance contract: n/a     Review of Systems  Constitutional:  Negative for diaphoresis.  Eyes:  Negative for pain.  Respiratory:  Negative for shortness of breath.   Cardiovascular:  Negative for chest pain, palpitations and leg swelling.  Gastrointestinal:  Negative for abdominal pain.  Endocrine: Negative for polydipsia.  Skin:  Negative for rash.  Neurological:  Negative for dizziness, weakness and headaches.  Hematological:  Does not bruise/bleed easily.  All other systems reviewed and are negative.      Objective:   Physical Exam Vitals and nursing note reviewed.  Constitutional:      General: She is not in acute distress.    Appearance: Normal appearance. She is well-developed.  HENT:     Head: Normocephalic.     Right Ear: Tympanic membrane normal.     Left Ear: Tympanic membrane normal.     Nose: Nose normal.     Mouth/Throat:     Mouth: Mucous membranes are moist.  Eyes:     Pupils: Pupils are equal, round, and reactive to light.  Neck:     Vascular: No carotid bruit or JVD.  Cardiovascular:     Rate and Rhythm: Normal rate and regular rhythm.     Heart sounds: Normal heart sounds.  Pulmonary:     Effort: Pulmonary effort is normal. No respiratory distress.     Breath sounds: Normal breath sounds. No wheezing or rales.  Chest:     Chest wall: No tenderness.  Abdominal:     General: Bowel sounds are normal. There is no distension or abdominal bruit.     Palpations: Abdomen is soft. There is no hepatomegaly, splenomegaly, mass or pulsatile mass.     Tenderness: There is no abdominal tenderness.  Musculoskeletal:        General: Normal range of motion.     Cervical back: Normal range of motion and neck supple.  Lymphadenopathy:     Cervical: No cervical adenopathy.  Skin:    General: Skin is warm and dry.  Neurological:     Mental Status: She is  alert and oriented to person, place, and time.     Deep Tendon Reflexes: Reflexes are normal and symmetric.  Psychiatric:        Behavior: Behavior normal.        Thought Content: Thought content normal.        Judgment: Judgment normal.    BP 111/70   Pulse 70  Temp 97.9 F (36.6 C) (Temporal)   Resp 20   Ht 5' 5" (1.651 m)   Wt 297 lb (134.7 kg)   SpO2 97%   BMI 49.42 kg/m   Hgba1c 5.3%      Assessment & Plan:   Rachael Allen comes in today with chief complaint of Medical Management of Chronic Issues   Diagnosis and orders addressed:  1. Primary hypertension Low sodium diet - CBC with Differential/Platelet - CMP14+EGFR - lisinopril (ZESTRIL) 20 MG tablet; Take 1 tablet (20 mg total) by mouth daily.  Dispense: 90 tablet; Refill: 1 - CBC with Differential/Platelet - CMP14+EGFR - Lipid panel  2. Intractable chronic migraine without aura and without status migrainosus Avoid caffeine  3. Diverticulosis Watch diet to prevent flare up  4. Recurrent major depressive disorder, in full remission ([place]) Stress management  5. Bipolar affective disorder, current episode mixed, current episode severity unspecified ([place]) Continue with psych  6. Cervical stenosis of spine  7. Morbid obesity ([place]) Discussed diet and exercise for person with BMI >25 Will recheck weight in 3-6 months Still waiting on wegovy prescription which has been on back order  8. Fibromyalgia muscle pain Moist heat  9. Type 2 diabetes mellitus without complication, without long-term current use of insulin (HCC) - Bayer DCA Hb A1c Waived - Lipid panel  10. PCOS (polycystic ovarian syndrome) - metFORMIN (GLUCOPHAGE-XR) 500 MG 24 hr tablet; Take 1 tablet (500 mg total) by mouth 2 (two) times daily.  Dispense: 180 tablet; Refill: 1   Labs pending Health Maintenance reviewed Diet and exercise encouraged  Follow up plan: 6 months   Mary-Margaret Hassell Done, FNP

## 2022-08-28 LAB — CBC WITH DIFFERENTIAL/PLATELET
Basophils Absolute: 0.1 10*3/uL (ref 0.0–0.2)
Basos: 1 %
EOS (ABSOLUTE): 0.2 10*3/uL (ref 0.0–0.4)
Eos: 2 %
Hematocrit: 38.7 % (ref 34.0–46.6)
Hemoglobin: 13.4 g/dL (ref 11.1–15.9)
Immature Grans (Abs): 0 10*3/uL (ref 0.0–0.1)
Immature Granulocytes: 1 %
Lymphocytes Absolute: 2.1 10*3/uL (ref 0.7–3.1)
Lymphs: 25 %
MCH: 28.9 pg (ref 26.6–33.0)
MCHC: 34.6 g/dL (ref 31.5–35.7)
MCV: 83 fL (ref 79–97)
Monocytes Absolute: 0.6 10*3/uL (ref 0.1–0.9)
Monocytes: 6 %
Neutrophils Absolute: 5.6 10*3/uL (ref 1.4–7.0)
Neutrophils: 65 %
Platelets: 269 10*3/uL (ref 150–450)
RBC: 4.64 x10E6/uL (ref 3.77–5.28)
RDW: 14.4 % (ref 11.7–15.4)
WBC: 8.5 10*3/uL (ref 3.4–10.8)

## 2022-08-28 LAB — CMP14+EGFR
ALT: 22 IU/L (ref 0–32)
AST: 15 IU/L (ref 0–40)
Albumin/Globulin Ratio: 1.7 (ref 1.2–2.2)
Albumin: 4.1 g/dL (ref 3.8–4.9)
Alkaline Phosphatase: 62 IU/L (ref 44–121)
BUN/Creatinine Ratio: 11 (ref 9–23)
BUN: 9 mg/dL (ref 6–24)
Bilirubin Total: 0.3 mg/dL (ref 0.0–1.2)
CO2: 22 mmol/L (ref 20–29)
Calcium: 9 mg/dL (ref 8.7–10.2)
Chloride: 102 mmol/L (ref 96–106)
Creatinine, Ser: 0.81 mg/dL (ref 0.57–1.00)
Globulin, Total: 2.4 g/dL (ref 1.5–4.5)
Glucose: 91 mg/dL (ref 70–99)
Potassium: 4 mmol/L (ref 3.5–5.2)
Sodium: 141 mmol/L (ref 134–144)
Total Protein: 6.5 g/dL (ref 6.0–8.5)
eGFR: 88 mL/min/{1.73_m2} (ref >59)

## 2022-08-28 LAB — LIPID PANEL
Chol/HDL Ratio: 4.1 ratio (ref 0.0–4.4)
Cholesterol, Total: 151 mg/dL (ref 100–199)
HDL: 37 mg/dL — ABNORMAL LOW (ref >39)
LDL Chol Calc (NIH): 92 mg/dL (ref 0–99)
Triglycerides: 124 mg/dL (ref 0–149)
VLDL Cholesterol Cal: 22 mg/dL (ref 5–40)

## 2022-09-02 DIAGNOSIS — M545 Low back pain, unspecified: Secondary | ICD-10-CM | POA: Insufficient documentation

## 2022-09-07 DIAGNOSIS — S338XXA Sprain of other parts of lumbar spine and pelvis, initial encounter: Secondary | ICD-10-CM | POA: Diagnosis not present

## 2022-09-07 DIAGNOSIS — S73101A Unspecified sprain of right hip, initial encounter: Secondary | ICD-10-CM | POA: Diagnosis not present

## 2022-09-08 ENCOUNTER — Telehealth: Payer: Self-pay | Admitting: Nurse Practitioner

## 2022-09-09 NOTE — Telephone Encounter (Signed)
Patient calling back on message left 9-27 about how often she can get shot for fibromyalgia. Told patient MMM was off yesterday and hadn't been addressed.

## 2022-09-09 NOTE — Telephone Encounter (Signed)
Every 3-6 months

## 2022-09-09 NOTE — Telephone Encounter (Signed)
Patient aware and verbalizes understanding. 

## 2022-09-10 ENCOUNTER — Encounter: Payer: Self-pay | Admitting: Family Medicine

## 2022-09-10 ENCOUNTER — Ambulatory Visit (INDEPENDENT_AMBULATORY_CARE_PROVIDER_SITE_OTHER): Payer: BC Managed Care – PPO | Admitting: Family Medicine

## 2022-09-10 VITALS — BP 113/76 | HR 66 | Temp 98.5°F | Ht 65.0 in | Wt 297.0 lb

## 2022-09-10 DIAGNOSIS — L0291 Cutaneous abscess, unspecified: Secondary | ICD-10-CM | POA: Diagnosis not present

## 2022-09-10 DIAGNOSIS — Z8742 Personal history of other diseases of the female genital tract: Secondary | ICD-10-CM

## 2022-09-10 DIAGNOSIS — N907 Vulvar cyst: Secondary | ICD-10-CM | POA: Diagnosis not present

## 2022-09-10 MED ORDER — DOXYCYCLINE HYCLATE 100 MG PO TABS: 100.0000 mg | ORAL_TABLET | Freq: Two times a day (BID) | ORAL | 0 refills | Status: AC

## 2022-09-10 MED ORDER — FLUCONAZOLE 150 MG PO TABS: 150.0000 mg | ORAL_TABLET | Freq: Once | ORAL | 1 refills | Status: AC

## 2022-09-10 NOTE — Progress Notes (Signed)
Subjective:  Patient ID: Rachael Allen, female    DOB: 09/02/1971, 51 y.o.   MRN: [id number]  Patient Care Team: Chevis Pretty, [place] as PCP - General (Nurse Practitioner)   Chief Complaint:  spots on privates   HPI: Rachael Allen is a 51 y.o. female presenting on 09/10/2022 for spots on privates   Pt presents today for evaluation of painless lesion to left vulva and painful lesion to right vulva. States lesion on left vulva has been there for a while but has increased slightly in size. States lesion on right vulva started 2 days ago and is painful to touch and with tactile pressure. No fever, chills, weakness, or confusion, denies drainage from lesion.     Relevant past medical, surgical, family, and social history reviewed and updated as indicated.  Allergies and medications reviewed and updated. Data reviewed: Chart in Epic.   Past Medical History:  Diagnosis Date   Bipolar disorder ([place])    Depression     Past Surgical History:  Procedure Laterality Date   CESAREAN SECTION     TONSILLECTOMY AND ADENOIDECTOMY      Social History   Socioeconomic History   Marital status: Married    Spouse name: Not on file   Number of children: Not on file   Years of education: Not on file   Highest education level: Not on file  Occupational History   Not on file  Tobacco Use   Smoking status: Never   Smokeless tobacco: Never  Vaping Use   Vaping Use: Not on file  Substance and Sexual Activity   Alcohol use: No   Drug use: No   Sexual activity: Yes    Birth control/protection: I.U.D.  Other Topics Concern   Not on file  Social History Narrative   Not on file   Social Determinants of Health   Financial Resource Strain: Not on file  Food Insecurity: Not on file  Transportation Needs: Not on file  Physical Activity: Not on file  Stress: Not on file  Social Connections: Not on file  Intimate Partner Violence: Not on file    Outpatient Encounter  Medications as of 09/10/2022  Medication Sig   [employer] B-12 500 MCG tablet Take 500 mcg by mouth daily.   doxycycline (VIBRA-TABS) 100 MG tablet Take 1 tablet (100 mg total) by mouth 2 (two) times daily for 10 days. 1 po bid   fluconazole (DIFLUCAN) 150 MG tablet Take 1 tablet (150 mg total) by mouth once for 1 dose.   hydrocortisone 1 % lotion Apply 1 Application topically 2 (two) times daily.   lisinopril (ZESTRIL) 20 MG tablet Take 1 tablet (20 mg total) by mouth daily.   lurasidone (LATUDA) 40 MG TABS tablet Take 1 tablet (40 mg total) by mouth daily with breakfast. (Patient taking differently: Take 40 mg by mouth daily.)   metFORMIN (GLUCOPHAGE-XR) 500 MG 24 hr tablet Take 1 tablet (500 mg total) by mouth 2 (two) times daily.   Semaglutide-Weight Management (WEGOVY) 0.25 MG/0.5ML SOAJ Inject 0.25 mg into the skin once a week.   valACYclovir (VALTREX) 1000 MG tablet TAKE 2 TABLETS TWICE A DAY BY MOUTH FOR 1 DAY THEN AS NEEDED FOR FEVER BLISTERS   No facility-administered encounter medications on file as of 09/10/2022.    Allergies  Allergen Reactions   Oxycodone-Acetaminophen Itching    vomiting   Nitrofurantoin Other (See Comments)    SEVERE HEADACHE   Benadryl [Diphenhydramine Hcl]  Dilaudid [Hydromorphone Hcl]    Latex    Nsaids Nausea Only    In large doses   Vicodin [Hydrocodone-Acetaminophen]     Review of Systems  Constitutional:  Negative for activity change, appetite change, chills, fatigue and fever.  HENT: Negative.    Eyes: Negative.   Respiratory:  Negative for cough, chest tightness and shortness of breath.   Cardiovascular:  Negative for chest pain, palpitations and leg swelling.  Gastrointestinal:  Negative for abdominal pain, blood in stool, constipation, diarrhea, nausea and vomiting.  Endocrine: Negative.   Genitourinary:  Positive for vaginal pain (lesion). Negative for dysuria, frequency and urgency.  Musculoskeletal:  Negative for arthralgias and myalgias.   Skin:  Positive for color change and rash. Negative for pallor and wound.  Allergic/Immunologic: Negative.   Neurological:  Negative for dizziness and headaches.  Hematological: Negative.   Psychiatric/Behavioral:  Negative for confusion, hallucinations, sleep disturbance and suicidal ideas.   All other systems reviewed and are negative.       Objective:  BP 113/76   Pulse 66   Temp 98.5 F (36.9 C)   Ht _0  (1.651 m)   Wt 297 lb (134.7 kg)   SpO2 95%   BMI 49.42 kg/m    Wt Readings from Last 3 Encounters:  09/10/22 297 lb (134.7 kg)  08/27/22 297 lb (134.7 kg)  08/05/22 297 lb (134.7 kg)    Physical Exam Vitals and nursing note reviewed.  Constitutional:      General: She is not in acute distress.    Appearance: Normal appearance. She is obese. She is not ill-appearing, toxic-appearing or diaphoretic.  HENT:     Head: Normocephalic and atraumatic.     Mouth/Throat:     Mouth: Mucous membranes are moist.  Eyes:     Pupils: Pupils are equal, round, and reactive to light.  Cardiovascular:     Rate and Rhythm: Normal rate and regular rhythm.     Heart sounds: Normal heart sounds.  Pulmonary:     Effort: Pulmonary effort is normal.     Breath sounds: Normal breath sounds.  Abdominal:     Hernia: There is no hernia in the left inguinal area or right inguinal area.  Genitourinary:    Exam position: Knee-chest position.     Pubic Area: No rash or pubic lice.     Musculoskeletal:     Cervical back: Neck supple.  Lymphadenopathy:     Lower Body: No right inguinal adenopathy. No left inguinal adenopathy.  Skin:    General: Skin is warm and dry.     Capillary Refill: Capillary refill takes less than 2 seconds.     Findings: Abscess present.  Neurological:     General: No focal deficit present.     Mental Status: She is alert and oriented to person, place, and time.  Psychiatric:        Mood and Affect: Mood normal.        Behavior: Behavior normal.         Thought Content: Thought content normal.        Judgment: Judgment normal.     Results for orders placed or performed in visit on 08/27/22  Bayer DCA Hb A1c Waived  Result Value Ref Range   HB A1C (BAYER DCA - WAIVED) 5.3 4.8 - 5.6 %  CBC with Differential/Platelet  Result Value Ref Range   WBC 8.5 3.4 - 10.8 x10E3/uL   RBC 4.64 3.77 - 5.28 x10E6/uL  Hemoglobin 13.4 11.1 - 15.9 g/dL   Hematocrit 38.7 34.0 - 46.6 %   MCV 83 79 - 97 fL   MCH 28.9 26.6 - 33.0 pg   MCHC 34.6 31.5 - 35.7 g/dL   RDW 14.4 11.7 - 15.4 %   Platelets 269 150 - 450 x10E3/uL   Neutrophils 65 Not Estab. %   Lymphs 25 Not Estab. %   Monocytes 6 Not Estab. %   Eos 2 Not Estab. %   Basos 1 Not Estab. %   Neutrophils Absolute 5.6 1.4 - 7.0 x10E3/uL   Lymphocytes Absolute 2.1 0.7 - 3.1 x10E3/uL   Monocytes Absolute 0.6 0.1 - 0.9 x10E3/uL   EOS (ABSOLUTE) 0.2 0.0 - 0.4 x10E3/uL   Basophils Absolute 0.1 0.0 - 0.2 x10E3/uL   Immature Granulocytes 1 Not Estab. %   Immature Grans (Abs) 0.0 0.0 - 0.1 x10E3/uL  CMP14+EGFR  Result Value Ref Range   Glucose 91 70 - 99 mg/dL   BUN 9 6 - 24 mg/dL   Creatinine, Ser 0.81 0.57 - 1.00 mg/dL   eGFR 88 >59 mL/min/1.73   BUN/Creatinine Ratio 11 9 - 23   Sodium 141 134 - 144 mmol/L   Potassium 4.0 3.5 - 5.2 mmol/L   Chloride 102 96 - 106 mmol/L   CO2 22 20 - 29 mmol/L   Calcium 9.0 8.7 - 10.2 mg/dL   Total Protein 6.5 6.0 - 8.5 g/dL   Albumin 4.1 3.8 - 4.9 g/dL   Globulin, Total 2.4 1.5 - 4.5 g/dL   Albumin/Globulin Ratio 1.7 1.2 - 2.2   Bilirubin Total 0.3 0.0 - 1.2 mg/dL   Alkaline Phosphatase 62 44 - 121 IU/L   AST 15 0 - 40 IU/L   ALT 22 0 - 32 IU/L  Lipid panel  Result Value Ref Range   Cholesterol, Total 151 100 - 199 mg/dL   Triglycerides 124 0 - 149 mg/dL   HDL 37 (L) >39 mg/dL   VLDL Cholesterol Cal 22 5 - 40 mg/dL   LDL Chol Calc (NIH) 92 0 - 99 mg/dL   Chol/HDL Ratio 4.1 0.0 - 4.4 ratio       Pertinent labs & imaging results that were available  during my care of the patient were reviewed by me and considered in my medical decision making.  Assessment & Plan:  Abaigeal was seen today for spots on privates.  Diagnoses and all orders for this visit:  Abscess Abscess to right vulva. Not fluctuant so I&D deferred. Doxycycline as prescribed. Symptomatic care discussed in detail. Report new worsening, or persistent symptoms.  -     doxycycline (VIBRA-TABS) 100 MG tablet; Take 1 tablet (100 mg total) by mouth 2 (two) times daily for 10 days. 1 po bid  Cyst of vulva Milia cyst of left vulva. Pt declines excision today.   History of vaginitis -     fluconazole (DIFLUCAN) 150 MG tablet; Take 1 tablet (150 mg total) by mouth once for 1 dose.     Continue all other maintenance medications.  Follow up plan: Return if symptoms worsen or fail to improve.   Continue healthy lifestyle choices, including diet (rich in fruits, vegetables, and lean proteins, and low in salt and simple carbohydrates) and exercise (at least 30 minutes of moderate physical activity daily).  Educational handout given for abscess  The above assessment and management plan was discussed with the patient. The patient verbalized understanding of and has agreed to the management plan. Patient  is aware to call the clinic if they develop any new symptoms or if symptoms persist or worsen. Patient is aware when to return to the clinic for a follow-up visit. Patient educated on when it is appropriate to go to the emergency department.   Monia Pouch, FNP-C [facility] [phone]

## 2022-09-27 ENCOUNTER — Telehealth: Payer: Self-pay | Admitting: Nurse Practitioner

## 2022-09-27 NOTE — Telephone Encounter (Signed)
Insurance will not approve mounjario if not a diabetic.

## 2022-09-27 NOTE — Telephone Encounter (Signed)
Pt called stating that she cant get her Rachael Allen refilled because its on back order until January. Wants to know if she can be switched to [facility]?

## 2022-09-28 NOTE — Telephone Encounter (Signed)
Patient aware and verbalized understanding. °

## 2022-10-13 ENCOUNTER — Other Ambulatory Visit: Payer: Self-pay | Admitting: *Deleted

## 2022-10-13 DIAGNOSIS — I10 Essential (primary) hypertension: Secondary | ICD-10-CM

## 2022-10-13 MED ORDER — LISINOPRIL 20 MG PO TABS: 20.0000 mg | ORAL_TABLET | Freq: Every day | ORAL | 1 refills | Status: DC

## 2022-10-15 ENCOUNTER — Telehealth: Payer: Self-pay | Admitting: Nurse Practitioner

## 2022-10-15 NOTE — Telephone Encounter (Signed)
Not right now but there should be something coming out soon

## 2022-10-15 NOTE — Telephone Encounter (Signed)
Pt wants to know if there are any other options of medicines for her to take for weight loss since she can't get the [facility].

## 2022-10-18 DIAGNOSIS — M5416 Radiculopathy, lumbar region: Secondary | ICD-10-CM | POA: Diagnosis not present

## 2022-10-22 ENCOUNTER — Ambulatory Visit: Payer: Medicare Other | Admitting: Nurse Practitioner

## 2022-10-25 ENCOUNTER — Ambulatory Visit: Payer: BC Managed Care – PPO | Attending: Physical Medicine and Rehabilitation

## 2022-10-25 ENCOUNTER — Other Ambulatory Visit: Payer: Self-pay

## 2022-10-25 DIAGNOSIS — M5459 Other low back pain: Secondary | ICD-10-CM | POA: Diagnosis not present

## 2022-10-25 DIAGNOSIS — M25551 Pain in right hip: Secondary | ICD-10-CM | POA: Insufficient documentation

## 2022-10-25 NOTE — Therapy (Signed)
OUTPATIENT PHYSICAL THERAPY THORACOLUMBAR EVALUATION   Patient Name: Rachael Allen MRN: [id number] DOB:22-Mar-1971, 51 y.o., female Today's Date: 10/25/2022   PT End of Session - 10/25/22 1116     Visit Number 1    Number of Visits 8    Date for PT Re-Evaluation 11/26/22    PT Start Time 1118    PT Stop Time 1150    PT Time Calculation (min) 32 min    Activity Tolerance Patient tolerated treatment well    Behavior During Therapy [facility] for tasks assessed/performed             Past Medical History:  Diagnosis Date   Bipolar disorder ([place])    Depression    Past Surgical History:  Procedure Laterality Date   CESAREAN SECTION     TONSILLECTOMY AND ADENOIDECTOMY     Patient Active Problem List   Diagnosis Date Noted   Mass of joint of right hip 07/28/2022   Primary hypertension 08/10/2021   Cervical stenosis of spine 07/03/2020   Diverticulosis 06/29/2017   Morbid obesity ([place]) 03/24/2016   OSA on CPAP 03/24/2016   Benign paroxysmal positional vertigo due to bilateral vestibular disorder 02/10/2016   Bipolar disorder ([place]) 01/26/2016   Depression 05/09/2014   Narcolepsy 05/09/2014   IUD (intrauterine device) in place 04/04/2014   Migraines 04/19/2013   Fibromyalgia muscle pain 03/07/2013   PCOS (polycystic ovarian syndrome) 03/07/2013   Fibrocystic breast 01/08/2013    PCP: Chevis Pretty, FNP  REFERRING PROVIDER: Suella Broad, MD   REFERRING DIAG: Radiculopathy, lumbar region   Rationale for Evaluation and Treatment: Rehabilitation  THERAPY DIAG:  Other low back pain  Pain in right hip  ONSET DATE: >6 months ago  SUBJECTIVE:                                                                                                                                                                                           SUBJECTIVE STATEMENT: Patient reports that she had been having pain in her right hip for over six months and her back has been bothering  her for about 8-9 months. She feels that her pain has been fairly steady since it first began. She has noticed that her legs have been getting weaker. She notes that she has numbness in both feet, but she reported that they think it is due to her fibromyalgia or PCOS.   PERTINENT HISTORY:  Hypertension, fibromyalgia, depression, bipolar disorder, morbid obesity, and allergies  PAIN:  Are you having pain? Yes: NPRS scale: 8/10 Pain location: low back and right hip Pain description: aching, constant Aggravating factors: cleaning her house,  sitting (about 30-45 minutes), standing (<30 minutes) Relieving factors: sitting in her recliner with a pillow under her hip ; heat  PRECAUTIONS: None  WEIGHT BEARING RESTRICTIONS: No  FALLS:  Has patient fallen in last 6 months? No  LIVING ENVIRONMENT: Lives with: lives with their family Lives in: House/apartment Stairs: Yes: External: 4 steps; can reach both; step to pattern Has following equipment at home: None  OCCUPATION: on disability  PLOF: Independent  PATIENT GOALS: improved ease cleaning her house (unable to sweep or mop currently), reduced pain  NEXT MD VISIT: to be scheduled after PT   OBJECTIVE:   PATIENT SURVEYS:  FOTO 29.97  SCREENING FOR RED FLAGS: Bowel or bladder incontinence: No Spinal tumors: No Cauda equina syndrome: No Compression fracture: No Abdominal aneurysm: No  COGNITION: Overall cognitive status: Within functional limits for tasks assessed     SENSATION: Patient reports numbness in both feet  POSTURE: rounded shoulders, forward head, and flexed trunk   PALPATION: TTP: lumbar paraspinals, QL, piriformis, gluteals, and TFL (R>L with all of these muscles)   LUMBAR ROM:   AROM eval  Flexion 40; mild low back pull  Extension 18  Right lateral flexion 25% limited; low back pain  Left lateral flexion 50% limited; low back pain  Right rotation 25% limited  Left rotation 25% limited    (Blank rows =  not tested)  LOWER EXTREMITY ROM:     Bilateral hip flexion: limited due to soft tissue approximation  LUMBAR JOINT MOBILITY:  Unable to be accurately assessed due to soft tissue and pain throughout lumbar spine  LOWER EXTREMITY MMT:    MMT Right eval Left eval  Hip flexion 4/5 4/5  Hip extension    Hip abduction    Hip adduction    Hip internal rotation    Hip external rotation    Knee flexion 4-/5 4/5  Knee extension 4/5 4-/5  Ankle dorsiflexion 4-/5 4-/5  Ankle plantarflexion    Ankle inversion    Ankle eversion     (Blank rows = not tested)  LUMBAR SPECIAL TESTS:  Special testing was unable due to positional intolerance to supine   GAIT: Assistive device utilized: None Level of assistance: Complete Independence  TODAY'S TREATMENT:                                                                                                                              DATE:     PATIENT EDUCATION:  Education details: POC, and goals for physical therapy Person educated: Patient Education method: Explanation Education comprehension: verbalized understanding  HOME EXERCISE PROGRAM:   ASSESSMENT:  CLINICAL IMPRESSION: Patient is a 51 y.o. female who was seen today for physical therapy evaluation and treatment for chronic low back pain.  She presented with moderate to high pain severity and irritability with lumbar sidebending and palpation to her lumbar musculature reproducing her familiar pain. Recommend that she continue with skilled physical therapy to address  her impairments to maximize her functional mobility.  OBJECTIVE IMPAIRMENTS: decreased activity tolerance, decreased mobility, difficulty walking, decreased ROM, decreased strength, hypomobility, postural dysfunction, and pain.   ACTIVITY LIMITATIONS: bending, standing, bed mobility, and locomotion level  PARTICIPATION LIMITATIONS: cleaning, laundry, and community activity  PERSONAL FACTORS: Fitness, Time since  onset of injury/illness/exacerbation, and 3+ comorbidities: Hypertension, fibromyalgia, depression, bipolar disorder, morbid obesity, and allergies  are also affecting patient's functional outcome.   REHAB POTENTIAL: Fair    CLINICAL DECISION MAKING: Evolving/moderate complexity  EVALUATION COMPLEXITY: Moderate   GOALS: Goals reviewed with patient? Yes  LONG TERM GOALS: Target date: 11/22/2022  Patient will independent with her HEP.  Baseline:  Goal status: INITIAL  2.  Patient will be able to complete her daily activities without her familiar pain exceeding 5/10. Baseline:  Goal status: INITIAL  3.  Patient will report being able to sweep for at least 5 minutes without being limited by her familiar low back pain. Baseline:  Goal status: INITIAL  4.  Patient will be able to navigate at least 4 steps with a reciprocal gait pattern for improved household mobility. Baseline:  Goal status: INITIAL  PLAN:  PT FREQUENCY: 2x/week  PT DURATION: 4 weeks  PLANNED INTERVENTIONS: Therapeutic exercises, Therapeutic activity, Neuromuscular re-education, Gait training, Patient/Family education, Self Care, Joint mobilization, Electrical stimulation, Spinal mobilization, Cryotherapy, Moist heat, Manual therapy, and Re-evaluation.  PLAN FOR NEXT SESSION: nustep, lower extremity strengthening, lumbar stabilization, and modalities as needed   Darlin Coco, PT 10/25/2022, 3:39 PM

## 2022-10-28 ENCOUNTER — Ambulatory Visit: Payer: BC Managed Care – PPO | Admitting: Physical Therapy

## 2022-10-28 ENCOUNTER — Encounter: Payer: Self-pay | Admitting: Physical Therapy

## 2022-10-28 DIAGNOSIS — M25551 Pain in right hip: Secondary | ICD-10-CM

## 2022-10-28 DIAGNOSIS — M5459 Other low back pain: Secondary | ICD-10-CM | POA: Diagnosis not present

## 2022-10-28 NOTE — Therapy (Signed)
OUTPATIENT PHYSICAL THERAPY THORACOLUMBAR EVALUATION   Patient Name: Rachael Allen MRN: [id number] DOB:07-27-1971, 51 y.o., female Today's Date: 10/28/2022   PT End of Session - 10/28/22 1117     Visit Number 2    Number of Visits 8    Date for PT Re-Evaluation 11/26/22    PT Start Time 1116    PT Stop Time 2585    PT Time Calculation (min) 48 min    Activity Tolerance Patient tolerated treatment well    Behavior During Therapy [facility] for tasks assessed/performed             Past Medical History:  Diagnosis Date   Bipolar disorder ([place])    Depression    Past Surgical History:  Procedure Laterality Date   CESAREAN SECTION     TONSILLECTOMY AND ADENOIDECTOMY     Patient Active Problem List   Diagnosis Date Noted   Mass of joint of right hip 07/28/2022   Primary hypertension 08/10/2021   Cervical stenosis of spine 07/03/2020   Diverticulosis 06/29/2017   Morbid obesity ([place]) 03/24/2016   OSA on CPAP 03/24/2016   Benign paroxysmal positional vertigo due to bilateral vestibular disorder 02/10/2016   Bipolar disorder ([place]) 01/26/2016   Depression 05/09/2014   Narcolepsy 05/09/2014   IUD (intrauterine device) in place 04/04/2014   Migraines 04/19/2013   Fibromyalgia muscle pain 03/07/2013   PCOS (polycystic ovarian syndrome) 03/07/2013   Fibrocystic breast 01/08/2013    PCP: Chevis Pretty, FNP  REFERRING PROVIDER: Suella Broad, MD   REFERRING DIAG: Radiculopathy, lumbar region   Rationale for Evaluation and Treatment: Rehabilitation  THERAPY DIAG:  Other low back pain  Pain in right hip  ONSET DATE: >6 months ago  SUBJECTIVE:                                                                                                                                                                                           SUBJECTIVE STATEMENT: Greater pain with sitting or standing.  PERTINENT HISTORY:  Hypertension, fibromyalgia, depression, bipolar  disorder, morbid obesity, and allergies  PAIN:  Are you having pain? Yes: NPRS scale: 5/10 Pain location: low back and right hip Pain description: aching, constant Aggravating factors: cleaning her house, sitting (about 30-45 minutes), standing (<30 minutes) Relieving factors: sitting in her recliner with a pillow under her hip ; heat  PRECAUTIONS: None  PATIENT GOALS: improved ease cleaning her house (unable to sweep or mop currently), reduced pain  NEXT MD VISIT: to be scheduled after PT   OBJECTIVE:   PATIENT SURVEYS:  FOTO 29.97  LUMBAR ROM:   AROM eval  Flexion  40; mild low back pull  Extension 18  Right lateral flexion 25% limited; low back pain  Left lateral flexion 50% limited; low back pain  Right rotation 25% limited  Left rotation 25% limited    (Blank rows = not tested)  LOWER EXTREMITY MMT:    MMT Right eval Left eval  Hip flexion 4/5 4/5  Hip extension    Hip abduction    Hip adduction    Hip internal rotation    Hip external rotation    Knee flexion 4-/5 4/5  Knee extension 4/5 4-/5  Ankle dorsiflexion 4-/5 4-/5  Ankle plantarflexion    Ankle inversion    Ankle eversion     (Blank rows = not tested)  TODAY'S TREATMENT:                                                                                                                              DATE:                                      EXERCISE LOG  Exercise Repetitions and Resistance Comments  Nustep L3 x13 min   Core press X15 reps 5 sec    Clam Red x20 reps   Hip flexion Red x20 reps seated   Hip abduction Standing x20 reps   Lat pulldown Blue XTS x20 reps   Arm raise at sink X10 reps 5 sec holds   Leg raise at sink X10 reps 5 sec holds    Blank cell = exercise not performed today   Modalities  Date: 11/16 Unattended Estim: Lumbar, IFC, 10 mins, Pain  PATIENT EDUCATION:  Education details: POC, and goals for physical therapy Person educated: Patient Education method:  Explanation Education comprehension: verbalized understanding  HOME EXERCISE PROGRAM:  ASSESSMENT:  CLINICAL IMPRESSION: Patient presented in clinic with reports of mod LBP. Patient able to complete all therex fairly well with multimodal cueing for posture and core activation. Patient indicated only slightly increased LBP by end of session. Normal stimulation response noted following removal of the modality.  OBJECTIVE IMPAIRMENTS: decreased activity tolerance, decreased mobility, difficulty walking, decreased ROM, decreased strength, hypomobility, postural dysfunction, and pain.   ACTIVITY LIMITATIONS: bending, standing, bed mobility, and locomotion level  PARTICIPATION LIMITATIONS: cleaning, laundry, and community activity  PERSONAL FACTORS: Fitness, Time since onset of injury/illness/exacerbation, and 3+ comorbidities: Hypertension, fibromyalgia, depression, bipolar disorder, morbid obesity, and allergies  are also affecting patient's functional outcome.   REHAB POTENTIAL: Fair    CLINICAL DECISION MAKING: Evolving/moderate complexity  EVALUATION COMPLEXITY: Moderate  GOALS: Goals reviewed with patient? Yes  LONG TERM GOALS: Target date: 11/22/2022  Patient will independent with her HEP.  Baseline:  Goal status: INITIAL  2.  Patient will be able to complete her daily activities without her familiar pain exceeding 5/10. Baseline:  Goal status: INITIAL  3.  Patient  will report being able to sweep for at least 5 minutes without being limited by her familiar low back pain. Baseline:  Goal status: INITIAL  4.  Patient will be able to navigate at least 4 steps with a reciprocal gait pattern for improved household mobility. Baseline:  Goal status: INITIAL  PLAN:  PT FREQUENCY: 2x/week  PT DURATION: 4 weeks  PLANNED INTERVENTIONS: Therapeutic exercises, Therapeutic activity, Neuromuscular re-education, Gait training, Patient/Family education, Self Care, Joint  mobilization, Electrical stimulation, Spinal mobilization, Cryotherapy, Moist heat, Manual therapy, and Re-evaluation.  PLAN FOR NEXT SESSION: nustep, lower extremity strengthening, lumbar stabilization, and modalities as needed  Standley Brooking, PTA 10/28/2022, 12:21 PM

## 2022-11-01 ENCOUNTER — Ambulatory Visit: Payer: BC Managed Care – PPO

## 2022-11-01 DIAGNOSIS — M25551 Pain in right hip: Secondary | ICD-10-CM | POA: Diagnosis not present

## 2022-11-01 DIAGNOSIS — M5459 Other low back pain: Secondary | ICD-10-CM | POA: Diagnosis not present

## 2022-11-01 NOTE — Therapy (Signed)
OUTPATIENT PHYSICAL THERAPY THORACOLUMBAR EVALUATION   Patient Name: Rachael Allen MRN: [id number] DOB:08-Apr-1971, 51 y.o., female Today's Date: 11/01/2022   PT End of Session - 11/01/22 1114     Visit Number 3    Number of Visits 8    Date for PT Re-Evaluation 11/26/22    PT Start Time 1115    PT Stop Time 9326    PT Time Calculation (min) 49 min    Activity Tolerance Patient tolerated treatment well    Behavior During Therapy [facility] for tasks assessed/performed             Past Medical History:  Diagnosis Date   Bipolar disorder ([place])    Depression    Past Surgical History:  Procedure Laterality Date   CESAREAN SECTION     TONSILLECTOMY AND ADENOIDECTOMY     Patient Active Problem List   Diagnosis Date Noted   Mass of joint of right hip 07/28/2022   Primary hypertension 08/10/2021   Cervical stenosis of spine 07/03/2020   Diverticulosis 06/29/2017   Morbid obesity ([place]) 03/24/2016   OSA on CPAP 03/24/2016   Benign paroxysmal positional vertigo due to bilateral vestibular disorder 02/10/2016   Bipolar disorder ([place]) 01/26/2016   Depression 05/09/2014   Narcolepsy 05/09/2014   IUD (intrauterine device) in place 04/04/2014   Migraines 04/19/2013   Fibromyalgia muscle pain 03/07/2013   PCOS (polycystic ovarian syndrome) 03/07/2013   Fibrocystic breast 01/08/2013    PCP: Chevis Pretty, FNP  REFERRING PROVIDER: Suella Broad, MD   REFERRING DIAG: Radiculopathy, lumbar region   Rationale for Evaluation and Treatment: Rehabilitation  THERAPY DIAG:  Other low back pain  Pain in right hip  ONSET DATE: >6 months ago  SUBJECTIVE:                                                                                                                                                                                           SUBJECTIVE STATEMENT: Patient reports that she is hurting today.   PERTINENT HISTORY:  Hypertension, fibromyalgia, depression, bipolar  disorder, morbid obesity, and allergies  PAIN:  Are you having pain? Yes: NPRS scale: 5/10 Pain location: low back and right hip Pain description: aching, constant Aggravating factors: cleaning her house, sitting (about 30-45 minutes), standing (<30 minutes) Relieving factors: sitting in her recliner with a pillow under her hip ; heat  PRECAUTIONS: None  PATIENT GOALS: improved ease cleaning her house (unable to sweep or mop currently), reduced pain  NEXT MD VISIT: to be scheduled after PT   OBJECTIVE:   PATIENT SURVEYS:  FOTO 29.97  LUMBAR ROM:   AROM eval  Flexion 40; mild low back pull  Extension 18  Right lateral flexion 25% limited; low back pain  Left lateral flexion 50% limited; low back pain  Right rotation 25% limited  Left rotation 25% limited    (Blank rows = not tested)  LOWER EXTREMITY MMT:    MMT Right eval Left eval  Hip flexion 4/5 4/5  Hip extension    Hip abduction    Hip adduction    Hip internal rotation    Hip external rotation    Knee flexion 4-/5 4/5  Knee extension 4/5 4-/5  Ankle dorsiflexion 4-/5 4-/5  Ankle plantarflexion    Ankle inversion    Ankle eversion     (Blank rows = not tested)  TODAY'S TREATMENT:                                                                                                                              DATE:                                     11/20 EXERCISE LOG  Exercise Repetitions and Resistance Comments  Nustep  L3-4 x 14 minutes    L SL clams 2 minutes For R hip ER  SL glute sets 2 minutes w/ 5 second hold            Blank cell = exercise not performed today  Manual Therapy Soft Tissue Mobilization: Lumbar paraspinals and right gluteals, for reduced pain and tone   Modalities: No redness or adverse reaction to today's modalities  Date:  Unattended Estim: Lumbar, IFC '@80'$ -150 Hz w/ 40% scan, 15 mins, Pain                                   11/16 EXERCISE LOG  Exercise Repetitions and  Resistance Comments  Nustep L3 x13 min   Core press X15 reps 5 sec    Clam Red x20 reps   Hip flexion Red x20 reps seated   Hip abduction Standing x20 reps   Lat pulldown Blue XTS x20 reps   Arm raise at sink X10 reps 5 sec holds   Leg raise at sink X10 reps 5 sec holds    Blank cell = exercise not performed today   Modalities  Date: 11/16 Unattended Estim: Lumbar, IFC, 10 mins, Pain  PATIENT EDUCATION:  Education details: POC, and goals for physical therapy Person educated: Patient Education method: Explanation Education comprehension: verbalized understanding  HOME EXERCISE PROGRAM:  ASSESSMENT:  CLINICAL IMPRESSION: Patient was introduced to glutes sets for light lumbar and gluteal engagement needed for improved function with her daily activities. She required minimal cueing with this intervention for proper exercise performance to avoid increasing her familiar pain. Manual therapy focused on soft tissue mobilization to her lumbar paraspinals  and gluteals for reduced pain and tone with minimal effectiveness. She reported feeling a little better upon the conclusion of treatment. Recommend that she continue with skilled physical therapy to address her remaining impairments to maximize her functional mobility.  OBJECTIVE IMPAIRMENTS: decreased activity tolerance, decreased mobility, difficulty walking, decreased ROM, decreased strength, hypomobility, postural dysfunction, and pain.   ACTIVITY LIMITATIONS: bending, standing, bed mobility, and locomotion level  PARTICIPATION LIMITATIONS: cleaning, laundry, and community activity  PERSONAL FACTORS: Fitness, Time since onset of injury/illness/exacerbation, and 3+ comorbidities: Hypertension, fibromyalgia, depression, bipolar disorder, morbid obesity, and allergies  are also affecting patient's functional outcome.   REHAB POTENTIAL: Fair    CLINICAL DECISION MAKING: Evolving/moderate complexity  EVALUATION COMPLEXITY:  Moderate  GOALS: Goals reviewed with patient? Yes  LONG TERM GOALS: Target date: 11/22/2022  Patient will independent with her HEP.  Baseline:  Goal status: INITIAL  2.  Patient will be able to complete her daily activities without her familiar pain exceeding 5/10. Baseline:  Goal status: INITIAL  3.  Patient will report being able to sweep for at least 5 minutes without being limited by her familiar low back pain. Baseline:  Goal status: INITIAL  4.  Patient will be able to navigate at least 4 steps with a reciprocal gait pattern for improved household mobility. Baseline:  Goal status: INITIAL  PLAN:  PT FREQUENCY: 2x/week  PT DURATION: 4 weeks  PLANNED INTERVENTIONS: Therapeutic exercises, Therapeutic activity, Neuromuscular re-education, Gait training, Patient/Family education, Self Care, Joint mobilization, Electrical stimulation, Spinal mobilization, Cryotherapy, Moist heat, Manual therapy, and Re-evaluation.  PLAN FOR NEXT SESSION: nustep, lower extremity strengthening, lumbar stabilization, and modalities as needed  Darlin Coco, PT 11/01/2022, 12:32 PM

## 2022-11-09 ENCOUNTER — Ambulatory Visit (INDEPENDENT_AMBULATORY_CARE_PROVIDER_SITE_OTHER): Payer: BC Managed Care – PPO | Admitting: Nurse Practitioner

## 2022-11-09 ENCOUNTER — Other Ambulatory Visit: Payer: Self-pay | Admitting: Nurse Practitioner

## 2022-11-09 ENCOUNTER — Encounter: Payer: Self-pay | Admitting: Nurse Practitioner

## 2022-11-09 VITALS — BP 133/86 | HR 77 | Temp 98.1°F | Resp 20 | Ht 65.0 in | Wt 305.0 lb

## 2022-11-09 DIAGNOSIS — M797 Fibromyalgia: Secondary | ICD-10-CM | POA: Diagnosis not present

## 2022-11-09 DIAGNOSIS — R635 Abnormal weight gain: Secondary | ICD-10-CM

## 2022-11-09 DIAGNOSIS — Z1231 Encounter for screening mammogram for malignant neoplasm of breast: Secondary | ICD-10-CM

## 2022-11-09 DIAGNOSIS — J069 Acute upper respiratory infection, unspecified: Secondary | ICD-10-CM

## 2022-11-09 MED ORDER — KETOROLAC TROMETHAMINE 60 MG/2ML IM SOLN
60.0000 mg | Freq: Once | INTRAMUSCULAR | Status: AC
  Administered 2022-11-09: 60 mg via INTRAMUSCULAR

## 2022-11-09 MED ORDER — FLUTICASONE PROPIONATE 50 MCG/ACT NA SUSP: 2.0000 | Freq: Every day | NASAL | 6 refills | Status: DC

## 2022-11-09 MED ORDER — METHYLPREDNISOLONE ACETATE 80 MG/ML IJ SUSP
80.0000 mg | Freq: Once | INTRAMUSCULAR | Status: AC
  Administered 2022-11-09: 80 mg via INTRAMUSCULAR

## 2022-11-09 NOTE — Progress Notes (Signed)
Subjective:    Patient ID: Rachael Allen, female    DOB: [birth year], 51 y.o.   MRN: [id number]   Chief Complaint: Discuss weight, Discuss fibromyalgia, and Nasal Congestion   HPI Patient come sin today with several complaints: - weight gain- she has heard about the Zepbound and would like  to try it since she cannot get wegovy.  Wt Readings from Last 3 Encounters:  11/09/22 (!) 305 lb (138.3 kg)  09/10/22 297 lb (134.7 kg)  08/27/22 297 lb (134.7 kg)   -fibromyalgia- has been really bad lately. Her hips and back hurt and she has been going  to PT which has helped a little. Her legs feel weak. She has had toradol and depo in the past which really helped her  - nasal congestion- started with runny nose and congestion yesterday. She has not taken anything for it at home.    Review of Systems  Constitutional:  Positive for fatigue. Negative for chills and fever.  HENT:  Negative for sinus pressure, sinus pain, sore throat and trouble swallowing.   Respiratory:  Positive for cough (slight).   Musculoskeletal:  Positive for arthralgias and myalgias.       Objective:   Physical Exam Vitals and nursing note reviewed.  Constitutional:      General: She is not in acute distress.    Appearance: Normal appearance. She is well-developed.  HENT:     Head: Normocephalic.     Right Ear: Tympanic membrane normal.     Left Ear: Tympanic membrane normal.     Nose: Congestion and rhinorrhea present.     Mouth/Throat:     Mouth: Mucous membranes are moist.  Eyes:     Pupils: Pupils are equal, round, and reactive to light.  Neck:     Vascular: No carotid bruit or JVD.  Cardiovascular:     Rate and Rhythm: Normal rate and regular rhythm.     Heart sounds: Normal heart sounds.  Pulmonary:     Effort: Pulmonary effort is normal. No respiratory distress.     Breath sounds: Normal breath sounds. No wheezing or rales.  Chest:     Chest wall: No tenderness.  Abdominal:     General:  Bowel sounds are normal. There is no distension or abdominal bruit.     Palpations: Abdomen is soft. There is no hepatomegaly, splenomegaly, mass or pulsatile mass.     Tenderness: There is no abdominal tenderness.  Musculoskeletal:        General: Normal range of motion.     Cervical back: Normal range of motion and neck supple.     Comments: Muscle pain on legs and back when getting on exam table  Lymphadenopathy:     Cervical: No cervical adenopathy.  Skin:    General: Skin is warm and dry.  Neurological:     Mental Status: She is alert and oriented to person, place, and time.     Deep Tendon Reflexes: Reflexes are normal and symmetric.  Psychiatric:        Behavior: Behavior normal.        Thought Content: Thought content normal.        Judgment: Judgment normal.     BP 133/86   Pulse 77   Temp 98.1 F (36.7 C) (Temporal)   Resp 20   Ht '5\' 5"'$  (1.651 m)   Wt (!) 305 lb (138.3 kg)   SpO2 97%   BMI 50.75 kg/m  Assessment & Plan:   JONESHA TSUCHIYA in today with chief complaint of Discuss weight, Discuss fibromyalgia, and Nasal Congestion   1. Fibromyalgia muscle pain Given a shot Moist heat - methylPREDNISolone acetate (DEPO-MEDROL) injection 80 mg - ketorolac (TORADOL) injection 60 mg  2. URI with cough and congestion 1. Take meds as prescribed 2. Use a cool mist humidifier especially during the winter months and when heat has been humid. 3. Use saline nose sprays frequently 4. Saline irrigations of the nose can be very helpful if done frequently.  * 4X daily for 1 week*  * Use of a nettie pot can be helpful with this. Follow directions with this* 5. Drink plenty of fluids 6. Keep thermostat turn down low 7.For any cough or congestion- mucinex 8. For fever or aces or pains- take tylenol or ibuprofen appropriate for age and weight.  * for fevers greater than 101 orally you may alternate ibuprofen and tylenol every  3 hours.   Flonase nasal spray  3.  Weight gain Discussed zepbound- will not be available until January sometime- in mean time call insurance and see if they will pay for it.    The above assessment and management plan was discussed with the patient. The patient verbalized understanding of and has agreed to the management plan. Patient is aware to call the clinic if symptoms persist or worsen. Patient is aware when to return to the clinic for a follow-up visit. Patient educated on when it is appropriate to go to the emergency department.   Mary-Margaret Hassell Done, FNP

## 2022-11-10 ENCOUNTER — Ambulatory Visit: Payer: BC Managed Care – PPO | Admitting: *Deleted

## 2022-11-12 ENCOUNTER — Telehealth (INDEPENDENT_AMBULATORY_CARE_PROVIDER_SITE_OTHER): Payer: BC Managed Care – PPO | Admitting: Nurse Practitioner

## 2022-11-12 ENCOUNTER — Encounter: Payer: Self-pay | Admitting: Nurse Practitioner

## 2022-11-12 DIAGNOSIS — R059 Cough, unspecified: Secondary | ICD-10-CM

## 2022-11-12 NOTE — Progress Notes (Signed)
   Virtual Visit  Note Due to COVID-19 pandemic this visit was conducted virtually. This visit type was conducted due to national recommendations for restrictions regarding the COVID-19 Pandemic (e.g. social distancing, sheltering in place) in an effort to limit this patient's exposure and mitigate transmission in our community. All issues noted in this document were discussed and addressed.  A physical exam was not performed with this format.  I connected with Rachael Allen on 11/12/22 at 12:00 pm  by telephone and verified that I am speaking with the correct person using two identifiers. Rachael Allen is currently located at home during visit. The provider, Ivy Lynn, NP is located in their office at time of visit.  I discussed the limitations, risks, security and privacy concerns of performing an evaluation and management service by telephone and the availability of in person appointments. I also discussed with the patient that there may be a patient responsible charge related to this service. The patient expressed understanding and agreed to proceed.   History and Present Illness:  Cough This is a recurrent problem. Episode onset: in the past 2-4 days. The problem has been unchanged. The problem occurs constantly. The cough is Productive of sputum. Associated symptoms include ear pain and nasal congestion. Pertinent negatives include no chest pain, chills, ear congestion, headaches, postnasal drip or shortness of breath. She has tried prescription cough suppressant for the symptoms.      Review of Systems  Constitutional:  Negative for chills.  HENT:  Positive for ear pain. Negative for postnasal drip.   Respiratory:  Positive for cough. Negative for shortness of breath.   Cardiovascular:  Negative for chest pain.  Neurological:  Negative for headaches.     Observations/Objective: Tele-visit patient is not in distress  Assessment and Plan: Patient present with improving  symptoms of URI, patient is concerned that her cough is not better and worried she may have pneumonia. Advised patient that she was seen 2 days ago and treated. Patient advised to continue medication and symptom management as prescribed , complete medication and follow up if symptoms are not resolving.     Follow Up Instructions: Follow up with worsening or unresolved symptoms    I discussed the assessment and treatment plan with the patient. The patient was provided an opportunity to ask questions and all were answered. The patient agreed with the plan and demonstrated an understanding of the instructions.   The patient was advised to call back or seek an in-person evaluation if the symptoms worsen or if the condition fails to improve as anticipated.  The above assessment and management plan was discussed with the patient. The patient verbalized understanding of and has agreed to the management plan. Patient is aware to call the clinic if symptoms persist or worsen. Patient is aware when to return to the clinic for a follow-up visit. Patient educated on when it is appropriate to go to the emergency department.   Time call ended:12;09 pm     I provided 9 minutes of  non face-to-face time during this encounter.    Ivy Lynn, NP

## 2022-11-15 ENCOUNTER — Encounter: Payer: Self-pay | Admitting: Family Medicine

## 2022-11-15 ENCOUNTER — Ambulatory Visit (INDEPENDENT_AMBULATORY_CARE_PROVIDER_SITE_OTHER): Payer: BC Managed Care – PPO | Admitting: Family Medicine

## 2022-11-15 DIAGNOSIS — J329 Chronic sinusitis, unspecified: Secondary | ICD-10-CM | POA: Diagnosis not present

## 2022-11-15 DIAGNOSIS — J40 Bronchitis, not specified as acute or chronic: Secondary | ICD-10-CM | POA: Diagnosis not present

## 2022-11-15 MED ORDER — AMOXICILLIN-POT CLAVULANATE 875-125 MG PO TABS: 1.0000 | ORAL_TABLET | Freq: Two times a day (BID) | ORAL | 0 refills | Status: DC

## 2022-11-15 MED ORDER — FLUCONAZOLE 150 MG PO TABS: 150.0000 mg | ORAL_TABLET | Freq: Once | ORAL | 0 refills | Status: AC

## 2022-11-15 NOTE — Progress Notes (Signed)
Subjective:    Patient ID: Rachael Allen, female    DOB: 09-04-1971, 51 y.o.   MRN: [id number]   HPI: Rachael Allen is a 51 y.o. female presenting for cough. Bad spells. Hard to catch her breath. Flonase helping with congestion. Still coughing up mostly clear, thick sputum. No DOE. Onset 7 days ago. Concerned that she got pneumonia from something like this last year. No fever.      11/09/2022    4:18 PM 09/10/2022    9:24 AM 08/27/2022   11:47 AM 08/05/2022    9:12 AM 07/01/2022   12:09 PM  Depression screen PHQ 2/9  Decreased Interest '1 1 1 1 1  '$ Down, Depressed, Hopeless 1 0 '1 1 1  '$ PHQ - 2 Score '2 1 2 2 2  '$ Altered sleeping 0 0 0 0 0  Tired, decreased energy '1 1 1 1 1  '$ Change in appetite '1 1 1 1 1  '$ Feeling bad or failure about yourself  0 0 0 0 0  Trouble concentrating 1 1 0 0 0  Moving slowly or fidgety/restless 0 0 0 0 0  Suicidal thoughts 0 0 0 0 0  PHQ-9 Score '5 4 4 4 4  '$ Difficult doing work/chores Somewhat difficult Somewhat difficult Somewhat difficult Somewhat difficult Somewhat difficult     Relevant past medical, surgical, family and social history reviewed and updated as indicated.  Interim medical history since our last visit reviewed. Allergies and medications reviewed and updated.  ROS:  Review of Systems  Constitutional:  Negative for activity change, appetite change, chills and fever.  HENT:  Positive for congestion, postnasal drip, rhinorrhea and sinus pressure. Negative for ear discharge, ear pain, hearing loss, nosebleeds, sneezing and trouble swallowing.   Respiratory:  Negative for chest tightness and shortness of breath.   Cardiovascular:  Negative for chest pain and palpitations.  Skin:  Negative for rash.     Social History   Tobacco Use  Smoking Status Never  Smokeless Tobacco Never       Objective:     Wt Readings from Last 3 Encounters:  11/09/22 (!) 305 lb (138.3 kg)  09/10/22 297 lb (134.7 kg)  08/27/22 297 lb (134.7 kg)      Exam deferred. Pt. Harboring due to COVID 19. Phone visit performed.   Assessment & Plan:   1. Sinobronchitis     Meds ordered this encounter  Medications   amoxicillin-clavulanate (AUGMENTIN) 875-125 MG tablet    Sig: Take 1 tablet by mouth 2 (two) times daily. Take all of this medication    Dispense:  20 tablet    Refill:  0   fluconazole (DIFLUCAN) 150 MG tablet    Sig: Take 1 tablet (150 mg total) by mouth once for 1 dose. At onset of symptoms. Repeat at end of treatment    Dispense:  2 tablet    Refill:  0    No orders of the defined types were placed in this encounter.     Diagnoses and all orders for this visit:  Sinobronchitis  Other orders -     amoxicillin-clavulanate (AUGMENTIN) 875-125 MG tablet; Take 1 tablet by mouth 2 (two) times daily. Take all of this medication -     fluconazole (DIFLUCAN) 150 MG tablet; Take 1 tablet (150 mg total) by mouth once for 1 dose. At onset of symptoms. Repeat at end of treatment    Virtual Visit via telephone Note  I discussed the limitations, risks,  security and privacy concerns of performing an evaluation and management service by telephone and the availability of in person appointments. The patient was identified with two identifiers. Pt.expressed understanding and agreed to proceed. Pt. Is at home. Dr. Livia Snellen is in his office.  Follow Up Instructions:   I discussed the assessment and treatment plan with the patient. The patient was provided an opportunity to ask questions and all were answered. The patient agreed with the plan and demonstrated an understanding of the instructions.   The patient was advised to call back or seek an in-person evaluation if the symptoms worsen or if the condition fails to improve as anticipated.   Total minutes including chart review and phone contact time: 6   Follow up plan: Return if symptoms worsen or fail to improve.  Claretta Fraise, MD [place]

## 2022-11-26 ENCOUNTER — Ambulatory Visit: Payer: BC Managed Care – PPO | Attending: Physical Medicine and Rehabilitation

## 2022-11-26 DIAGNOSIS — M5459 Other low back pain: Secondary | ICD-10-CM | POA: Diagnosis not present

## 2022-11-26 DIAGNOSIS — M25551 Pain in right hip: Secondary | ICD-10-CM | POA: Insufficient documentation

## 2022-11-26 NOTE — Therapy (Signed)
OUTPATIENT PHYSICAL THERAPY THORACOLUMBAR TREATMENT   Patient Name: Rachael Allen MRN: [id number] DOB:09/30/1971, 51 y.o., female Today's Date: 11/26/2022   PT End of Session - 11/26/22 1029     Visit Number 4    Number of Visits 8    Date for PT Re-Evaluation 11/26/22    PT Start Time 1030    PT Stop Time 1124    PT Time Calculation (min) 54 min    Activity Tolerance Patient tolerated treatment well    Behavior During Therapy [facility] for tasks assessed/performed             Past Medical History:  Diagnosis Date   Bipolar disorder ([place])    Depression    Past Surgical History:  Procedure Laterality Date   CESAREAN SECTION     TONSILLECTOMY AND ADENOIDECTOMY     Patient Active Problem List   Diagnosis Date Noted   Mass of joint of right hip 07/28/2022   Primary hypertension 08/10/2021   Cervical stenosis of spine 07/03/2020   Diverticulosis 06/29/2017   Morbid obesity ([place]) 03/24/2016   OSA on CPAP 03/24/2016   Benign paroxysmal positional vertigo due to bilateral vestibular disorder 02/10/2016   Bipolar disorder ([place]) 01/26/2016   Depression 05/09/2014   Narcolepsy 05/09/2014   IUD (intrauterine device) in place 04/04/2014   Migraines 04/19/2013   Fibromyalgia muscle pain 03/07/2013   PCOS (polycystic ovarian syndrome) 03/07/2013   Fibrocystic breast 01/08/2013    PCP: Chevis Pretty, FNP  REFERRING PROVIDER: Suella Broad, MD   REFERRING DIAG: Radiculopathy, lumbar region   Rationale for Evaluation and Treatment: Rehabilitation  THERAPY DIAG:  Other low back pain  Pain in right hip  ONSET DATE: >6 months ago  SUBJECTIVE:                                                                                                                                                                                           SUBJECTIVE STATEMENT: Patient reports that her back has been getting worse since her last appointment on 11/20. However, she notes that  she has been sick.   PERTINENT HISTORY:  Hypertension, fibromyalgia, depression, bipolar disorder, morbid obesity, and allergies  PAIN:  Are you having pain? Yes: NPRS scale: 7/10 Pain location: low back and right hip Pain description: aching, constant Aggravating factors: cleaning her house, sitting (about 30-45 minutes), standing (<30 minutes) Relieving factors: sitting in her recliner with a pillow under her hip ; heat  PRECAUTIONS: None  PATIENT GOALS: improved ease cleaning her house (unable to sweep or mop currently), reduced pain  NEXT MD VISIT: to be scheduled after PT  OBJECTIVE:   PATIENT SURVEYS:  FOTO 29.97  LUMBAR ROM:   AROM eval  Flexion 40; mild low back pull  Extension 18  Right lateral flexion 25% limited; low back pain  Left lateral flexion 50% limited; low back pain  Right rotation 25% limited  Left rotation 25% limited    (Blank rows = not tested)  LOWER EXTREMITY MMT:    MMT Right eval Left eval  Hip flexion 4/5 4/5  Hip extension    Hip abduction    Hip adduction    Hip internal rotation    Hip external rotation    Knee flexion 4-/5 4/5  Knee extension 4/5 4-/5  Ankle dorsiflexion 4-/5 4-/5  Ankle plantarflexion    Ankle inversion    Ankle eversion     (Blank rows = not tested)  TODAY'S TREATMENT:                                                                                                                              DATE:                                     12/15 EXERCISE LOG  Exercise Repetitions and Resistance Comments  Nustep  L3 x 15 minutes   Isometric ball press 3 minutes w/ 5 second hold    Ball roll out  3 minutes   LAQ 3 minutes   Slouch overcorrect  3 minutes   Heel raise 30 reps     Blank cell = exercise not performed today  Modalities: no redness or adverse reaction to today's modalities  Date:  Unattended Estim: Lumbar, IFC @ 80-150 Hz w/ 40% scan, 10 mins, Pain and Tone                                    11/20 EXERCISE LOG  Exercise Repetitions and Resistance Comments  Nustep  L3-4 x 14 minutes    L SL clams 2 minutes For R hip ER  SL glute sets 2 minutes w/ 5 second hold            Blank cell = exercise not performed today  Manual Therapy Soft Tissue Mobilization: Lumbar paraspinals and right gluteals, for reduced pain and tone   Modalities: No redness or adverse reaction to today's modalities  Date:  Unattended Estim: Lumbar, IFC '@80'$ -150 Hz w/ 40% scan, 15 mins, Pain                                   11/16 EXERCISE LOG  Exercise Repetitions and Resistance Comments  Nustep L3 x13 min   Core press X15 reps 5 sec    Clam Red x20 reps   Hip flexion  Red x20 reps seated   Hip abduction Standing x20 reps   Lat pulldown Blue XTS x20 reps   Arm raise at sink X10 reps 5 sec holds   Leg raise at sink X10 reps 5 sec holds    Blank cell = exercise not performed today   Modalities  Date: 11/16 Unattended Estim: Lumbar, IFC, 10 mins, Pain  PATIENT EDUCATION:  Education details: POC, and goals for physical therapy Person educated: Patient Education method: Explanation Education comprehension: verbalized understanding  HOME EXERCISE PROGRAM:  ASSESSMENT:  CLINICAL IMPRESSION: Patient has made minimal progress with skilled physical therapy since her initial evaluation on 10/25/22. This is evidenced by her subjective reports, functional mobility, and progress toward her goals. She has yet to meet any of her goals for therapy due to her familiar pain. This can partially be attributed due to being unable to attend therapy since 11/01/22 due to illness. She was introduced to multiple new interventions for improved lumbar mobility and stability with moderate difficulty. She required minimal cueing with these interventions for proper exercise performance to facilitate lumbar engagement. She experience no significant increase in her familiar symptoms with any of today's interventions.  Electrical stimulation was the most effective at reducing her familiar pain. She reported that her back felt better upon the conclusion of treatment. She continues to require skilled physical therapy to address her remaining impairments to maximize her functional mobility.   OBJECTIVE IMPAIRMENTS: decreased activity tolerance, decreased mobility, difficulty walking, decreased ROM, decreased strength, hypomobility, postural dysfunction, and pain.   ACTIVITY LIMITATIONS: bending, standing, bed mobility, and locomotion level  PARTICIPATION LIMITATIONS: cleaning, laundry, and community activity  PERSONAL FACTORS: Fitness, Time since onset of injury/illness/exacerbation, and 3+ comorbidities: Hypertension, fibromyalgia, depression, bipolar disorder, morbid obesity, and allergies  are also affecting patient's functional outcome.   REHAB POTENTIAL: Fair    CLINICAL DECISION MAKING: Evolving/moderate complexity  EVALUATION COMPLEXITY: Moderate  GOALS: Goals reviewed with patient? Yes  LONG TERM GOALS: Target date: 11/22/2022  Patient will independent with her HEP.  Baseline: She reports that she has been doing her HEP about 3x per week  Goal status: IN PROGRESS  2.  Patient will be able to complete her daily activities without her familiar pain exceeding 5/10. Baseline:  Goal status: IN PROGRESS  3.  Patient will report being able to sweep for at least 5 minutes without being limited by her familiar low back pain. Baseline:  Goal status: IN PROGRESS  4.  Patient will be able to navigate at least 4 steps with a reciprocal gait pattern for improved household mobility. Baseline:  Goal status: IN PROGRESS  PLAN:  PT FREQUENCY: 2x/week  PT DURATION: 4 weeks  PLANNED INTERVENTIONS: Therapeutic exercises, Therapeutic activity, Neuromuscular re-education, Gait training, Patient/Family education, Self Care, Joint mobilization, Electrical stimulation, Spinal mobilization, Cryotherapy, Moist  heat, Manual therapy, and Re-evaluation.  PLAN FOR NEXT SESSION: nustep, lower extremity strengthening, lumbar stabilization, and modalities as needed  Darlin Coco, PT 11/26/2022, 12:44 PM

## 2022-12-03 ENCOUNTER — Ambulatory Visit: Payer: BC Managed Care – PPO

## 2022-12-03 DIAGNOSIS — M5459 Other low back pain: Secondary | ICD-10-CM

## 2022-12-03 DIAGNOSIS — M25551 Pain in right hip: Secondary | ICD-10-CM | POA: Diagnosis not present

## 2022-12-03 NOTE — Therapy (Signed)
OUTPATIENT PHYSICAL THERAPY THORACOLUMBAR TREATMENT   Patient Name: Rachael Allen MRN: [id number] DOB:[birth year], 51 y.o., female Today's Date: 12/03/2022   PT End of Session - 12/03/22 1041     Visit Number 5    Number of Visits 8    Date for PT Re-Evaluation 11/26/22    PT Start Time 1030    PT Stop Time 1128    PT Time Calculation (min) 58 min    Activity Tolerance Patient tolerated treatment well    Behavior During Therapy [facility] for tasks assessed/performed             Past Medical History:  Diagnosis Date   Bipolar disorder ([place])    Depression    Past Surgical History:  Procedure Laterality Date   CESAREAN SECTION     TONSILLECTOMY AND ADENOIDECTOMY     Patient Active Problem List   Diagnosis Date Noted   Mass of joint of right hip 07/28/2022   Primary hypertension 08/10/2021   Cervical stenosis of spine 07/03/2020   Diverticulosis 06/29/2017   Morbid obesity ([place]) 03/24/2016   OSA on CPAP 03/24/2016   Benign paroxysmal positional vertigo due to bilateral vestibular disorder 02/10/2016   Bipolar disorder ([place]) 01/26/2016   Depression 05/09/2014   Narcolepsy 05/09/2014   IUD (intrauterine device) in place 04/04/2014   Migraines 04/19/2013   Fibromyalgia muscle pain 03/07/2013   PCOS (polycystic ovarian syndrome) 03/07/2013   Fibrocystic breast 01/08/2013    PCP: Chevis Pretty, FNP  REFERRING PROVIDER: Suella Broad, MD   REFERRING DIAG: Radiculopathy, lumbar region   Rationale for Evaluation and Treatment: Rehabilitation  THERAPY DIAG:  Other low back pain  Pain in right hip  ONSET DATE: >6 months ago  SUBJECTIVE:                                                                                                                                                                                           SUBJECTIVE STATEMENT: Pt arrives today reporting 6/10 mid low back pain, but denies any hip pain.    PERTINENT HISTORY:  Hypertension,  fibromyalgia, depression, bipolar disorder, morbid obesity, and allergies  PAIN:  Are you having pain? Yes: NPRS scale: 6/10 Pain location: low back Pain description: aching, constant Aggravating factors: cleaning her house, sitting (about 30-45 minutes), standing (<30 minutes) Relieving factors: sitting in her recliner with a pillow under her hip ; heat  PRECAUTIONS: None  PATIENT GOALS: improved ease cleaning her house (unable to sweep or mop currently), reduced pain  NEXT MD VISIT: to be scheduled after PT   OBJECTIVE:   PATIENT SURVEYS:  FOTO 29.97  LUMBAR  ROM:   AROM eval  Flexion 40; mild low back pull  Extension 18  Right lateral flexion 25% limited; low back pain  Left lateral flexion 50% limited; low back pain  Right rotation 25% limited  Left rotation 25% limited    (Blank rows = not tested)  LOWER EXTREMITY MMT:    MMT Right eval Left eval  Hip flexion 4/5 4/5  Hip extension    Hip abduction    Hip adduction    Hip internal rotation    Hip external rotation    Knee flexion 4-/5 4/5  Knee extension 4/5 4-/5  Ankle dorsiflexion 4-/5 4-/5  Ankle plantarflexion    Ankle inversion    Ankle eversion     (Blank rows = not tested)  TODAY'S TREATMENT:                                                                                                                              DATE:                                     12/22 EXERCISE LOG  Exercise Repetitions and Resistance Comments  Nustep  L3 x 15 minutes   Isometric ball press 3 minutes w/ 5 second hold    Ball roll out  3 minutes   LAQ 2# x 25 reps bil   Seated Marches 2# x 25 reps bil   Slouch overcorrect  3 minutes   Heel raise 30 reps     Blank cell = exercise not performed today   Manual Therapy Soft Tissue Mobilization: lumbar region, STW/M to thoracic/lumbar paraspinals to decrease pain and tone with pt positioned in left side-lying for comfort with pillow between her knees    Modalities: no  redness or adverse reaction to today's modalities  Date:  Unattended Estim: Lumbar, IFC @ 80-150 Hz w/ 40% scan, 15 mins, Pain and Tone                                   11/20 EXERCISE LOG  Exercise Repetitions and Resistance Comments  Nustep  L3-4 x 14 minutes    L SL clams 2 minutes For R hip ER  SL glute sets 2 minutes w/ 5 second hold            Blank cell = exercise not performed today  Manual Therapy Soft Tissue Mobilization: Lumbar paraspinals and right gluteals, for reduced pain and tone   Modalities: No redness or adverse reaction to today's modalities  Date:  Unattended Estim: Lumbar, IFC '@80'$ -150 Hz w/ 40% scan, 15 mins, Pain  11/16 EXERCISE LOG  Exercise Repetitions and Resistance Comments  Nustep L3 x13 min   Core press X15 reps 5 sec    Clam Red x20 reps   Hip flexion Red x20 reps seated   Hip abduction Standing x20 reps   Lat pulldown Blue XTS x20 reps   Arm raise at sink X10 reps 5 sec holds   Leg raise at sink X10 reps 5 sec holds    Blank cell = exercise not performed today   Modalities  Date: 11/16 Unattended Estim: Lumbar, IFC, 10 mins, Pain  PATIENT EDUCATION:  Education details: POC, and goals for physical therapy Person educated: Patient Education method: Explanation Education comprehension: verbalized understanding  HOME EXERCISE PROGRAM:  ASSESSMENT:  CLINICAL IMPRESSION: Pt arrives for today's treatment session reporting 6/10 mid low back pain.  Pt denies any hip pain today.  Pt able to tolerate 2# weight with seated LAQ and seated marches today without issue, min cues required for posture.  STW/M performed to thoracic/lumbar paraspinals to decrease pain and tone.  Normal responses to estim noted upon removal.  Pt reported 4/10 low back pain at completion of today's treatment session.  OBJECTIVE IMPAIRMENTS: decreased activity tolerance, decreased mobility, difficulty walking, decreased ROM, decreased  strength, hypomobility, postural dysfunction, and pain.   ACTIVITY LIMITATIONS: bending, standing, bed mobility, and locomotion level  PARTICIPATION LIMITATIONS: cleaning, laundry, and community activity  PERSONAL FACTORS: Fitness, Time since onset of injury/illness/exacerbation, and 3+ comorbidities: Hypertension, fibromyalgia, depression, bipolar disorder, morbid obesity, and allergies  are also affecting patient's functional outcome.   REHAB POTENTIAL: Fair    CLINICAL DECISION MAKING: Evolving/moderate complexity  EVALUATION COMPLEXITY: Moderate  GOALS: Goals reviewed with patient? Yes  LONG TERM GOALS: Target date: 11/22/2022  Patient will independent with her HEP.  Baseline: She reports that she has been doing her HEP about 3x per week  Goal status: IN PROGRESS  2.  Patient will be able to complete her daily activities without her familiar pain exceeding 5/10. Baseline:  Goal status: IN PROGRESS  3.  Patient will report being able to sweep for at least 5 minutes without being limited by her familiar low back pain. Baseline:  Goal status: IN PROGRESS  4.  Patient will be able to navigate at least 4 steps with a reciprocal gait pattern for improved household mobility. Baseline:  Goal status: IN PROGRESS  PLAN:  PT FREQUENCY: 2x/week  PT DURATION: 4 weeks  PLANNED INTERVENTIONS: Therapeutic exercises, Therapeutic activity, Neuromuscular re-education, Gait training, Patient/Family education, Self Care, Joint mobilization, Electrical stimulation, Spinal mobilization, Cryotherapy, Moist heat, Manual therapy, and Re-evaluation.  PLAN FOR NEXT SESSION: nustep, lower extremity strengthening, lumbar stabilization, and modalities as needed  Kathrynn Ducking, PTA 12/03/2022, 11:31 AM

## 2022-12-10 ENCOUNTER — Encounter: Payer: Self-pay | Admitting: Physical Therapy

## 2022-12-10 ENCOUNTER — Ambulatory Visit: Payer: BC Managed Care – PPO | Admitting: Physical Therapy

## 2022-12-10 DIAGNOSIS — M5459 Other low back pain: Secondary | ICD-10-CM

## 2022-12-10 DIAGNOSIS — M25551 Pain in right hip: Secondary | ICD-10-CM | POA: Diagnosis not present

## 2022-12-10 NOTE — Therapy (Addendum)
OUTPATIENT PHYSICAL THERAPY THORACOLUMBAR TREATMENT   Patient Name: Rachael Allen MRN: [id number] DOB:08-Mar-1971, 51 y.o., female Today's Date: 12/10/2022   PT End of Session - 12/10/22 1126     Visit Number 6    Number of Visits 8    Date for PT Re-Evaluation 11/26/22    PT Start Time 1120    PT Stop Time 3664    PT Time Calculation (min) 38 min    Activity Tolerance Patient tolerated treatment well    Behavior During Therapy [facility] for tasks assessed/performed            Past Medical History:  Diagnosis Date   Bipolar disorder ([place])    Depression    Past Surgical History:  Procedure Laterality Date   CESAREAN SECTION     TONSILLECTOMY AND ADENOIDECTOMY     Patient Active Problem List   Diagnosis Date Noted   Mass of joint of right hip 07/28/2022   Primary hypertension 08/10/2021   Cervical stenosis of spine 07/03/2020   Diverticulosis 06/29/2017   Morbid obesity ([place]) 03/24/2016   OSA on CPAP 03/24/2016   Benign paroxysmal positional vertigo due to bilateral vestibular disorder 02/10/2016   Bipolar disorder ([place]) 01/26/2016   Depression 05/09/2014   Narcolepsy 05/09/2014   IUD (intrauterine device) in place 04/04/2014   Migraines 04/19/2013   Fibromyalgia muscle pain 03/07/2013   PCOS (polycystic ovarian syndrome) 03/07/2013   Fibrocystic breast 01/08/2013   PCP: Chevis Pretty, FNP  REFERRING PROVIDER: Suella Broad, MD   REFERRING DIAG: Radiculopathy, lumbar region   Rationale for Evaluation and Treatment: Rehabilitation  THERAPY DIAG:  Other low back pain  Pain in right hip  ONSET DATE: >6 months ago  SUBJECTIVE:                                                                                                                                                                                           SUBJECTIVE STATEMENT: States that she is stopped PT as her husband is getting ready to have surgery at the beginning of the year. Has more hip  pain than LBP today. Has someone coming in her home to mop and sweep as she cannot. Has started going to the gym to get more activity in but may have aggravated her back more.  PERTINENT HISTORY:  Hypertension, fibromyalgia, depression, bipolar disorder, morbid obesity, and allergies  PAIN:  Are you having pain? Yes: NPRS scale: 5/10 Pain location: low back Pain description: aching, constant Aggravating factors: cleaning her house, sitting (about 30-45 minutes), standing (<30 minutes) Relieving factors: sitting in her recliner with a pillow under her hip ;  heat  PRECAUTIONS: None  PATIENT GOALS: improved ease cleaning her house (unable to sweep or mop currently), reduced pain  NEXT MD VISIT: to be scheduled after PT   OBJECTIVE:   PATIENT SURVEYS:  FOTO 29.97  LUMBAR ROM:   AROM eval  Flexion 40; mild low back pull  Extension 18  Right lateral flexion 25% limited; low back pain  Left lateral flexion 50% limited; low back pain  Right rotation 25% limited  Left rotation 25% limited    (Blank rows = not tested)  LOWER EXTREMITY MMT:    MMT Right eval Left eval  Hip flexion 4/5 4/5  Hip extension    Hip abduction    Hip adduction    Hip internal rotation    Hip external rotation    Knee flexion 4-/5 4/5  Knee extension 4/5 4-/5  Ankle dorsiflexion 4-/5 4-/5  Ankle plantarflexion    Ankle inversion    Ankle eversion     (Blank rows = not tested)  TODAY'S TREATMENT:                                                                                                                              DATE:                                     12/29 EXERCISE LOG  Exercise Repetitions and Resistance Comments  Nustep  L3 x 15 minutes   Isometric cabinet press X20 reps w/ 5 second hold    LAQ 3# x 25 reps bil   Seated Marches 3# x 25 reps bil   Slouch overcorrect  3 minutes   Heel raise 30 reps     Blank cell = exercise not performed today   Modalities: no redness or  adverse reaction to today's modalities  Date:  Unattended Estim: Lumbar, IFC @ 80-150 Hz w/ 40% scan, 15 mins, Pain and Tone  PATIENT EDUCATION:  Education details: POC, and goals for physical therapy Person educated: Patient Education method: Explanation Education comprehension: verbalized understanding  HOME EXERCISE PROGRAM:  ASSESSMENT:  CLINICAL IMPRESSION: Patient presented in clinic with reports of moderate LBP. Patient still limited with activities such as sweeping and mopping which she has someone come into her home to help with. Patient reports compliance with HEP but was also provided handout for TENS unit for use at home for trips or ADLs, etc. Patient able to tolerate therex well without report from patient of excessive pain. Patient able to meet or partially achieve goals. Normal stimulation response noted following removal of the modality.  PHYSICAL THERAPY DISCHARGE SUMMARY  Visits from Start of Care: 6  Current functional level related to goals / functional outcomes: Patient was able to partially meet her goals for skilled physical therapy.  However, she continues to be limited by increased low back pain which limits her functional  mobility.   Remaining deficits: Pain   Education / Equipment: HEP  Patient agrees to discharge. Patient goals were partially met. Patient is being discharged due to the patient's request.  Jacqulynn Cadet, PT, DPT    OBJECTIVE IMPAIRMENTS: decreased activity tolerance, decreased mobility, difficulty walking, decreased ROM, decreased strength, hypomobility, postural dysfunction, and pain.   ACTIVITY LIMITATIONS: bending, standing, bed mobility, and locomotion level  PARTICIPATION LIMITATIONS: cleaning, laundry, and community activity  PERSONAL FACTORS: Fitness, Time since onset of injury/illness/exacerbation, and 3+ comorbidities: Hypertension, fibromyalgia, depression, bipolar disorder, morbid obesity, and allergies  are also  affecting patient's functional outcome.   REHAB POTENTIAL: Fair    CLINICAL DECISION MAKING: Evolving/moderate complexity  EVALUATION COMPLEXITY: Moderate  GOALS: Goals reviewed with patient? Yes  LONG TERM GOALS: Target date: 11/22/2022  Patient will independent with her HEP.  Baseline: She reports that she has been doing her HEP about 3x per week  Goal status: MET  2.  Patient will be able to complete her daily activities without her familiar pain exceeding 5/10. Baseline:  Goal status: MET  3.  Patient will report being able to sweep for at least 5 minutes without being limited by her familiar low back pain. Baseline:  Goal status: NOT MET  4.  Patient will be able to navigate at least 4 steps with a reciprocal gait pattern for improved household mobility. Baseline:  Goal status: PARTIALLY MET as she is nonreciprical  PLAN:  PT FREQUENCY: 2x/week  PT DURATION: 4 weeks  PLANNED INTERVENTIONS: Therapeutic exercises, Therapeutic activity, Neuromuscular re-education, Gait training, Patient/Family education, Self Care, Joint mobilization, Electrical stimulation, Spinal mobilization, Cryotherapy, Moist heat, Manual therapy, and Re-evaluation.  PLAN FOR NEXT SESSION: DC  Standley Brooking, PTA 12/10/2022, 12:01 PM

## 2022-12-14 ENCOUNTER — Telehealth: Payer: Self-pay | Admitting: Nurse Practitioner

## 2022-12-14 MED ORDER — ZEPBOUND 7.5 MG/0.5ML ~~LOC~~ SOAJ: 7.5000 mg | SUBCUTANEOUS | 0 refills | Status: DC

## 2022-12-14 NOTE — Telephone Encounter (Signed)
Zepbound sent to pharamacy- have  a new goup of people doing PA so may take a wile.

## 2022-12-14 NOTE — Telephone Encounter (Signed)
Pt wants to know if MMM would be willing to prescribe her Zepbound and if so, can Rx be sent to [facility]. Says it will require a PA and the pharmacy can't tell pt how much cost would be without the PA.   Please advise and call patient with update.

## 2022-12-14 NOTE — Telephone Encounter (Signed)
Pt aware rx sent in and voiced understanding.

## 2022-12-15 ENCOUNTER — Other Ambulatory Visit: Payer: Self-pay | Admitting: Nurse Practitioner

## 2022-12-15 ENCOUNTER — Telehealth: Payer: Self-pay | Admitting: Nurse Practitioner

## 2022-12-15 MED ORDER — ZEPBOUND 7.5 MG/0.5ML ~~LOC~~ SOAJ: 7.5000 mg | SUBCUTANEOUS | 0 refills | Status: DC

## 2022-12-15 MED ORDER — ZEPBOUND 2.5 MG/0.5ML ~~LOC~~ SOAJ: 2.5000 mg | SUBCUTANEOUS | 3 refills | Status: DC

## 2022-12-15 NOTE — Progress Notes (Signed)
Meds ordered this encounter  Medications   tirzepatide (ZEPBOUND) 2.5 MG/0.5ML Pen    Sig: Inject 2.5 mg into the skin once a week.    Dispense:  2 mL    Refill:  3    Order Specific Question:   Supervising Provider    Answer:   Worthy Rancher [[id number]]   [place], FNP

## 2022-12-15 NOTE — Telephone Encounter (Signed)
Wants clarification on how she is supposed to be taking medications. Please call back

## 2022-12-15 NOTE — Telephone Encounter (Signed)
  Prescription Request  12/15/2022  Is this a "Controlled Substance" medicine?   Have you seen your PCP in the last 2 weeks?   If YES, route message to pool  -  If NO, patient needs to be scheduled for appointment.  What is the name of the medication or equipment? tirzepatide (ZEPBOUND) 7.5 MG/0.5ML Pen   Have you contacted your pharmacy to request a refill? [facility] will not take the savings card    Which pharmacy would you like this sent to?  [id number]- [place], [place] - [address]      Patient notified that their request is being sent to the clinical staff for review and that they should receive a response within 2 business days.

## 2022-12-15 NOTE — Telephone Encounter (Signed)
S[facility] TO [facility].

## 2022-12-15 NOTE — Telephone Encounter (Signed)
Wants to know if she should start at the 7.5 of the zepbound or should she start at a lower dosage also should she take her metformin with it?

## 2022-12-23 ENCOUNTER — Telehealth: Payer: Self-pay

## 2022-12-23 ENCOUNTER — Telehealth: Payer: Self-pay | Admitting: Nurse Practitioner

## 2022-12-23 NOTE — Telephone Encounter (Signed)
Patient has 3 refills on current dose- needs to stay on that about 2 months

## 2022-12-23 NOTE — Telephone Encounter (Signed)
Ronny Flurry (Key: [id number]) PA Case ID #: [id number] Rx #: [id number] Need Help? Call us at [phone] Outcome Approved on January 5 CaseId:[id number];Status:Approved;Review Type:Prior Auth;Coverage Start Date:11/17/2022;Coverage End Date:08/14/2023; Authorization Expiration Date: 08/13/2023 Drug Zepbound 2.'5MG'$ /0.5ML pen-injectors ePA cloud logo Form [employer] Electronic PA Form [phone] NCPDP) [place] [address]  Pharmacy informed

## 2022-12-23 NOTE — Telephone Encounter (Signed)
Patient notified and verbalized understanding. 

## 2022-12-24 ENCOUNTER — Telehealth: Payer: Self-pay

## 2022-12-27 NOTE — Telephone Encounter (Signed)
Ronny Flurry (Key: [id number]) PA Case ID #: [id number] Rx #: [id number] Need Help? Call us at [phone] Outcome Approved on January 5 CaseId:[id number];Status:Approved;Review Type:Prior Auth;Coverage Start Date:11/17/2022;Coverage End Date:08/14/2023; Authorization Expiration Date: 08/13/2023

## 2023-01-10 ENCOUNTER — Ambulatory Visit
Admission: RE | Admit: 2023-01-10 | Discharge: 2023-01-10 | Disposition: A | Discharge status: Home / Self Care | Payer: BC Managed Care – PPO | Source: Ambulatory Visit

## 2023-01-10 DIAGNOSIS — Z1231 Encounter for screening mammogram for malignant neoplasm of breast: Secondary | ICD-10-CM

## 2023-01-14 ENCOUNTER — Ambulatory Visit (INDEPENDENT_AMBULATORY_CARE_PROVIDER_SITE_OTHER): Payer: BC Managed Care – PPO | Admitting: Nurse Practitioner

## 2023-01-14 ENCOUNTER — Encounter: Payer: Self-pay | Admitting: Nurse Practitioner

## 2023-01-14 VITALS — BP 118/80 | HR 73 | Temp 97.7°F | Resp 20 | Ht 65.0 in | Wt 301.0 lb

## 2023-01-14 DIAGNOSIS — L732 Hidradenitis suppurativa: Secondary | ICD-10-CM

## 2023-01-14 DIAGNOSIS — Z23 Encounter for immunization: Secondary | ICD-10-CM

## 2023-01-14 MED ORDER — SULFAMETHOXAZOLE-TRIMETHOPRIM 800-160 MG PO TABS: 1.0000 | ORAL_TABLET | Freq: Two times a day (BID) | ORAL | 0 refills | Status: DC

## 2023-01-14 NOTE — Progress Notes (Signed)
   Subjective:    Patient ID: Rachael Allen, female    DOB: 1971-01-18, 52 y.o.   MRN: [id number]   Chief Complaint: Areas under right arm pit   HPI  Patient in c/o boil like lesion in right axillia. Started about 3 day sago.   Review of Systems  Constitutional:  Negative for diaphoresis.  Eyes:  Negative for pain.  Respiratory:  Negative for shortness of breath.   Cardiovascular:  Negative for chest pain, palpitations and leg swelling.  Gastrointestinal:  Negative for abdominal pain.  Endocrine: Negative for polydipsia.  Skin:  Negative for rash.  Neurological:  Negative for dizziness, weakness and headaches.  Hematological:  Does not bruise/bleed easily.  All other systems reviewed and are negative.      Objective:   Physical Exam Vitals reviewed.  Constitutional:      Appearance: Normal appearance.  Cardiovascular:     Rate and Rhythm: Normal rate and regular rhythm.     Heart sounds: Normal heart sounds.  Pulmonary:     Effort: Pulmonary effort is normal.     Breath sounds: Normal breath sounds.  Skin:    Comments: 3cm annular indurated erythematous lesion right axillia  Neurological:     Mental Status: She is alert.     BP 118/80   Pulse 73   Temp 97.7 F (36.5 C) (Temporal)   Resp 20   Ht '5\' 5"'$  (1.651 m)   Wt (!) 301 lb (136.5 kg)   SpO2 98%   BMI 50.09 kg/m        Assessment & Plan:  Rachael Allen in today with chief complaint of Areas under right arm pit   1. Hidradenitis suppurativa of right axilla Warm compresses RTO prn  Meds ordered this encounter  Medications   sulfamethoxazole-trimethoprim (BACTRIM DS) 800-160 MG tablet    Sig: Take 1 tablet by mouth 2 (two) times daily.    Dispense:  20 tablet    Refill:  0    Order Specific Question:   Supervising Provider    Answer:   Caryl Pina A [[id number]]       The above assessment and management plan was discussed with the patient. The patient verbalized understanding of and  has agreed to the management plan. Patient is aware to call the clinic if symptoms persist or worsen. Patient is aware when to return to the clinic for a follow-up visit. Patient educated on when it is appropriate to go to the emergency department.   Mary-Margaret Hassell Done, FNP

## 2023-01-14 NOTE — Patient Instructions (Signed)
Hidradenitis Suppurativa Hidradenitis suppurativa is a long-term (chronic) skin disease. It is similar to a severe form of acne, but it affects areas of the body where acne would be unusual, especially areas of the body where skin rubs against skin and becomes moist. These include: Underarms. Groin. Genital area. Buttocks. Upper thighs. Breasts. Hidradenitis suppurativa may start out as small lumps or pimples caused by blocked skin pores, sweat glands, or hair follicles. Pimples may develop into deep sores that break open (rupture) and drain pus. Over time, affected areas of skin may thicken and become scarred. This condition is rare and does not spread from person to person (non-contagious). What are the causes? The exact cause of this condition is not known. It may be related to: Female and female hormones. An overactive disease-fighting system (immune system). The immune system may over-react to blocked hair follicles or sweat glands and cause swelling and pus-filled sores. What increases the risk? You are more likely to develop this condition if you: Are female. Are 11-55 years old. Have a family history of hidradenitis suppurativa. Have a personal history of acne. Are overweight. Smoke. Take the medicine lithium. What are the signs or symptoms? The first symptoms are usually painful bumps in the skin, similar to pimples. The condition may get worse over time (progress), or it may only cause mild symptoms. If the disease progresses, symptoms may include: Skin bumps getting bigger and growing deeper into the skin. Bumps rupturing and draining pus. Itchy, infected skin. Skin getting thicker and scarred. Tunnels under the skin (fistulas) where pus drains from a bump. Pain during daily activities, such as pain during walking if your groin area is affected. Emotional problems, such as stress or depression. This condition may affect your appearance and your ability or willingness to wear  certain clothes or do certain activities. How is this diagnosed? This condition is diagnosed by a health care provider who specializes in skin conditions (dermatologist). You may be diagnosed based on: Your symptoms and medical history. A physical exam. Testing a pus sample for infection. Blood tests. How is this treated? Your treatment will depend on how severe your symptoms are. The same treatment will not work for everybody with this condition. You may need to try several treatments to find what works best for you. Treatment may include: Cleaning and bandaging (dressing) your wounds as needed. Lifestyle changes, such as new skin care routines. Taking medicines, such as: Antibiotics. Acne medicines. Medicines to reduce the activity of the immune system. A diabetes medicine (metformin). Birth control pills, for women. Steroids to reduce swelling and pain. Working with a mental health care provider, if you experience emotional distress due to this condition. If you have severe symptoms that do not get better with medicine, you may need surgery. Surgery may involve: Using a laser to clear the skin and remove hair follicles. Opening and draining deep sores. Removing the areas of skin that are diseased and scarred. Follow these instructions at home: Medicines  Take over-the-counter and prescription medicines only as told by your health care provider. If you were prescribed antibiotics, take them as told by your health care provider. Do not stop using the antibiotic even if your condition improves. Skin care If you have open wounds, cover them with a clean dressing as told by your health care provider. Keep wounds clean by washing them gently with soap and water when you bathe. Do not shave the areas where you get hidradenitis suppurativa. Wear loose-fitting clothes. Try to avoid   getting overheated or sweaty. If you get sweaty or wet, change into clean, dry clothes as soon as you can. To  help relieve pain and itchiness, cover sore areas with a warm, clean washcloth (warm compress) for 5-10 minutes as often as needed. Your healthcare provider may recommend an antiperspirant deodorant that may be gentle on your skin. A daily antiseptic wash to cleanse affected areas may be suggested by your healthcare provider. General instructions Learn as much as you can about your disease so that you have an active role in your treatment. Work closely with your health care provider to find treatments that work for you. If you are overweight, work with your health care provider to lose weight as recommended. Do not use any products that contain nicotine or tobacco. These products include cigarettes, chewing tobacco, and vaping devices, such as e-cigarettes. If you need help quitting, ask your health care provider. If you struggle with living with this condition, talk with your health care provider or work with a mental health care provider as recommended. Keep all follow-up visits. Where to find more information Hidradenitis Suppurativa Foundation, Inc.: www.hs-foundation.org American Academy of Dermatology: www.aad.org Contact a health care provider if: You have a flare-up of hidradenitis suppurativa. You have a fever or chills. You have trouble controlling your symptoms at home. You have trouble doing your daily activities because of your symptoms. You have trouble dealing with emotional problems related to your condition. Summary Hidradenitis suppurativa is a long-term (chronic) skin disease. It is similar to a severe form of acne, but it affects areas of the body where acne would be unusual. The first symptoms are usually painful bumps in the skin, similar to pimples. The condition may only cause mild symptoms, or it may get worse over time (progress). If you have open wounds, cover them with a clean dressing as told by your health care provider. Keep wounds clean by washing them gently with  soap and water when you bathe. Besides skin care, treatment may include medicines, laser treatment, and surgery. This information is not intended to replace advice given to you by your health care provider. Make sure you discuss any questions you have with your health care provider. Document Revised: 01/20/2022 Document Reviewed: 01/20/2022 Elsevier Patient Education  2023 Elsevier Inc.  

## 2023-01-20 ENCOUNTER — Telehealth: Payer: Self-pay | Admitting: Nurse Practitioner

## 2023-01-20 MED ORDER — FLUCONAZOLE 150 MG PO TABS: 150.0000 mg | ORAL_TABLET | Freq: Once | ORAL | 0 refills | Status: AC

## 2023-01-20 NOTE — Telephone Encounter (Signed)
Diflucan sent to pharmacy.

## 2023-02-04 ENCOUNTER — Encounter: Payer: Self-pay | Admitting: Family

## 2023-02-04 ENCOUNTER — Ambulatory Visit (INDEPENDENT_AMBULATORY_CARE_PROVIDER_SITE_OTHER): Payer: BC Managed Care – PPO | Admitting: Family

## 2023-02-04 VITALS — BP 117/78 | HR 61 | Temp 97.5°F | Ht 65.0 in | Wt 296.0 lb

## 2023-02-04 DIAGNOSIS — T3695XA Adverse effect of unspecified systemic antibiotic, initial encounter: Secondary | ICD-10-CM

## 2023-02-04 DIAGNOSIS — B379 Candidiasis, unspecified: Secondary | ICD-10-CM

## 2023-02-04 DIAGNOSIS — L732 Hidradenitis suppurativa: Secondary | ICD-10-CM

## 2023-02-04 DIAGNOSIS — L0291 Cutaneous abscess, unspecified: Secondary | ICD-10-CM

## 2023-02-04 MED ORDER — FLUCONAZOLE 150 MG PO TABS: 150.0000 mg | ORAL_TABLET | ORAL | 0 refills | Status: DC | PRN

## 2023-02-04 MED ORDER — CEPHALEXIN 500 MG PO CAPS: 500.0000 mg | ORAL_CAPSULE | Freq: Three times a day (TID) | ORAL | 0 refills | Status: DC

## 2023-02-04 NOTE — Patient Instructions (Signed)
Skin Abscess  A skin abscess is an infected area on or under your skin. It contains pus and other material. An abscess may also be called a furuncle, carbuncle, or boil. It is often the result of an infection caused by bacteria. An abscess can occur in or on almost any part of your body. Sometimes, an abscess may break open (rupture) on its own. In most cases, it will keep getting worse unless it is treated. An abscess can cause pain and make you feel ill. An untreated abscess can cause infection to spread to other parts of your body or your bloodstream. The abscess may need to be drained. You may also need to take antibiotics. What are the causes? An abscess occurs when germs, like bacteria, pass through your skin and cause an infection. This may be caused by: A scrape or cut on your skin. A puncture wound through your skin, such as a needle injection or insect bite. Blocked oil or sweat glands. Blocked and infected hair follicles. A fluid-filled sac that forms beneath your skin (sebaceous cyst) and becomes infected. What increases the risk? You may be more likely to develop an abscess if: You have problems with blood circulation, or you have a weak body defense system (immune system). You have diabetes. You have dry and irritated skin. You get injections often or use IV drugs. You have a foreign body in a wound, such as a splinter. You smoke or use tobacco products. What are the signs or symptoms? Symptoms of this condition include: A painful, firm bump under the skin. A bump with pus at the top. This may break through the skin and drain. Other symptoms include: Redness and swelling around the abscess. Warmth or tenderness. Swelling of the lymph nodes (glands) near the abscess. A sore on the skin. How is this diagnosed? This condition may be diagnosed based on a physical exam and your medical history. You may also have tests done, such as: A test of a sample of pus. This may be done  to find what is causing the infection. Blood tests. Imaging tests, such as an ultrasound, CT scan, or MRI. How is this treated? A small abscess that drains on its own may not need to be treated. Treatment for larger abscesses may include: Moist heat or a heat pack applied to the area a few times a day. Incision and drainage. This is a procedure to drain the abscess. Antibiotics. For a severe abscess, you may first get antibiotics through an IV and then change to antibiotics by mouth. Follow these instructions at home: Medicines Take over-the-counter and prescription medicines only as told by your provider. If you were prescribed antibiotics, take them as told by your provider. Do not stop using the antibiotic even if you start to feel better. Abscess care  If you have an abscess that has not drained, apply heat to the affected area. Use the heat source that your provider recommends, such as a moist heat pack or a heating pad. Place a towel between your skin and the heat source. Leave the heat on for 20-30 minutes at a time. If your skin turns bright red, remove the heat right away to prevent burns. The risk of burns is higher if you cannot feel pain, heat, or cold. Follow instructions from your provider about how to take care of your abscess. Make sure you: Cover the abscess with a bandage (dressing). Wash your hands with soap and water for at least 20 seconds before  and after you change the dressing or gauze. If soap and water are not available, use hand sanitizer. Change your dressing or gauze as told by your provider. Check your abscess every day for signs of an infection that is getting worse. Check for: More redness, swelling, pain, or tenderness. More fluid or blood. Warmth. More pus or a worse smell. General instructions To avoid spreading the infection: Do not share personal care items, towels, or hot tubs with others. Avoid making skin contact with other people. Be careful  when getting rid of used dressings, wound packing, or any drainage from the abscess. Do not use any products that contain nicotine or tobacco. These products include cigarettes, chewing tobacco, and vaping devices, such as e-cigarettes. If you need help quitting, ask your provider. Do not use any creams, ointments, or liquids unless you have been told to by your provider. Contact a health care provider if: You see redness that spreads quickly or red streaks on your skin spreading away from the abscess. You have any signs of worse infection at the abscess. You vomit every time you eat or drink. You have a fever, chills, or muscle aches. The cyst or abscess returns. Get help right away if: You have severe pain. You make less pee (urine) than normal. This information is not intended to replace advice given to you by your health care provider. Make sure you discuss any questions you have with your health care provider. Document Revised: 07/14/2022 Document Reviewed: 07/14/2022 Elsevier Patient Education  [place].

## 2023-02-04 NOTE — Progress Notes (Signed)
   Subjective:    Patient ID: Rachael Allen, female    DOB: 1971/08/20, 52 y.o.   MRN: [id number]  Chief Complaint  Patient presents with   Recurrent Skin Infections    ON BOTTOM     HPI PT presents to the office today with an abscess on right buttocks that she noticed two days ago. Denies any discharge or fever. Reports mild aching pain of 4 out 10.    Review of Systems  All other systems reviewed and are negative.      Objective:   Physical Exam Vitals reviewed.  Constitutional:      General: She is not in acute distress.    Appearance: She is well-developed.  HENT:     Head: Normocephalic and atraumatic.  Eyes:     Pupils: Pupils are equal, round, and reactive to light.  Neck:     Thyroid: No thyromegaly.  Cardiovascular:     Rate and Rhythm: Normal rate and regular rhythm.     Heart sounds: Normal heart sounds. No murmur heard. Pulmonary:     Effort: Pulmonary effort is normal. No respiratory distress.     Breath sounds: Normal breath sounds. No wheezing.  Abdominal:     General: Bowel sounds are normal. There is no distension.     Palpations: Abdomen is soft.     Tenderness: There is no abdominal tenderness.  Musculoskeletal:        General: No tenderness. Normal range of motion.     Cervical back: Normal range of motion and neck supple.  Skin:    General: Skin is warm and dry.          Comments: Small hard abscess on right buttocks   Neurological:     Mental Status: She is alert and oriented to person, place, and time.     Cranial Nerves: No cranial nerve deficit.     Deep Tendon Reflexes: Reflexes are normal and symmetric.  Psychiatric:        Behavior: Behavior normal.        Thought Content: Thought content normal.        Judgment: Judgment normal.       BP 117/78   Pulse 61   Temp (!) 97.5 F (36.4 C) (Temporal)   Ht 5' 5"$  (1.651 m)   Wt 296 lb (134.3 kg)   SpO2 97%   BMI 49.26 kg/m      Assessment & Plan:  Rachael Allen comes in  today with chief complaint of Recurrent Skin Infections (ON BOTTOM )   Diagnosis and orders addressed:  1. Abscess - cephALEXin (KEFLEX) 500 MG capsule; Take 1 capsule (500 mg total) by mouth 3 (three) times daily.  Dispense: 21 capsule; Refill: 0  2. Hidradenitis suppurativa - cephALEXin (KEFLEX) 500 MG capsule; Take 1 capsule (500 mg total) by mouth 3 (three) times daily.  Dispense: 21 capsule; Refill: 0   3. Antibiotic-induced yeast infection - fluconazole (DIFLUCAN) 150 MG tablet; Take 1 tablet (150 mg total) by mouth every three (3) days as needed.  Dispense: 3 tablet; Refill: 0   Warm compresses  Avoid picking or squeezing  Keflex TID for 7 days Diflucan as needed Follow up if symptoms worsen or do not improve    Evelina Dun, FNP

## 2023-02-11 DIAGNOSIS — M79672 Pain in left foot: Secondary | ICD-10-CM | POA: Diagnosis not present

## 2023-02-24 LAB — HM DIABETES EYE EXAM

## 2023-02-25 ENCOUNTER — Ambulatory Visit: Payer: BC Managed Care – PPO | Admitting: Nurse Practitioner

## 2023-02-25 ENCOUNTER — Encounter: Payer: Self-pay | Admitting: Nurse Practitioner

## 2023-02-25 VITALS — BP 130/88 | HR 59 | Temp 97.3°F | Resp 20 | Ht 65.0 in | Wt 291.0 lb

## 2023-02-25 DIAGNOSIS — K5790 Diverticulosis of intestine, part unspecified, without perforation or abscess without bleeding: Secondary | ICD-10-CM

## 2023-02-25 DIAGNOSIS — E119 Type 2 diabetes mellitus without complications: Secondary | ICD-10-CM

## 2023-02-25 DIAGNOSIS — Z6841 Body Mass Index (BMI) 40.0 and over, adult: Secondary | ICD-10-CM

## 2023-02-25 DIAGNOSIS — F3342 Major depressive disorder, recurrent, in full remission: Secondary | ICD-10-CM

## 2023-02-25 DIAGNOSIS — R102 Pelvic and perineal pain: Secondary | ICD-10-CM

## 2023-02-25 DIAGNOSIS — G4733 Obstructive sleep apnea (adult) (pediatric): Secondary | ICD-10-CM | POA: Diagnosis not present

## 2023-02-25 DIAGNOSIS — Z0001 Encounter for general adult medical examination with abnormal findings: Secondary | ICD-10-CM

## 2023-02-25 DIAGNOSIS — M797 Fibromyalgia: Secondary | ICD-10-CM

## 2023-02-25 DIAGNOSIS — Z0000 Encounter for general adult medical examination without abnormal findings: Secondary | ICD-10-CM

## 2023-02-25 DIAGNOSIS — I10 Essential (primary) hypertension: Secondary | ICD-10-CM

## 2023-02-25 DIAGNOSIS — E282 Polycystic ovarian syndrome: Secondary | ICD-10-CM

## 2023-02-25 DIAGNOSIS — G43719 Chronic migraine without aura, intractable, without status migrainosus: Secondary | ICD-10-CM | POA: Diagnosis not present

## 2023-02-25 DIAGNOSIS — F3160 Bipolar disorder, current episode mixed, unspecified: Secondary | ICD-10-CM

## 2023-02-25 LAB — BAYER DCA HB A1C WAIVED: HB A1C (BAYER DCA - WAIVED): 5 % (ref 4.8–5.6)

## 2023-02-25 MED ORDER — ZEPBOUND 5 MG/0.5ML ~~LOC~~ SOAJ: 5.0000 mg | SUBCUTANEOUS | 1 refills | Status: DC

## 2023-02-25 MED ORDER — METFORMIN HCL ER 500 MG PO TB24: 500.0000 mg | ORAL_TABLET | Freq: Two times a day (BID) | ORAL | 1 refills | Status: DC

## 2023-02-25 MED ORDER — LISINOPRIL 20 MG PO TABS: 20.0000 mg | ORAL_TABLET | Freq: Every day | ORAL | 1 refills | Status: DC

## 2023-02-25 NOTE — Patient Instructions (Signed)
Tirzepatide Injection (Weight Management) What is this medication? TIRZEPATIDE (tir ZEP a tide) promotes weight loss. It may also be used to maintain weight loss. It works by decreasing appetite. Changes to diet and exercise are often combined with this medication. This medicine may be used for other purposes; ask your health care provider or pharmacist if you have questions. COMMON BRAND NAME(S): Zepbound What should I tell my care team before I take this medication? They need to know if you have any of these conditions: Eye disease caused by diabetes Gallbladder disease History of depression Pancreatic disease Kidney disease Stomach or intestine problems, such as problems digesting food Suicidal thoughts, plans, or attempt by you or a family member Personal or family history of MEN 2, a condition that causes endocrine gland tumors Personal or family history of thyroid cancer An unusual or allergic reaction to tirzepatide, other medications, foods, dyes, or preservatives Pregnant or trying to get pregnant Breastfeeding How should I use this medication? This medication is injected under the skin. You will be taught how to prepare and give it. Take it as directed on the prescription label. Keep taking it unless your care team tells you to stop. It is important that you put your used needles and syringes in a special sharps container. Do not put them in a trash can. If you do not have a sharps container, call your pharmacist or care team to get one. A special MedGuide will be given to you by the pharmacist with each prescription and refill. Be sure to read this information carefully each time. This medication comes with INSTRUCTIONS FOR USE. Ask your pharmacist for directions on how to use this medication. Read the information carefully. Talk to your pharmacist or care team if you have questions. Talk to your care team about the use of this medication in children. Special care may be  needed. Overdosage: If you think you have taken too much of this medicine contact a poison control center or emergency room at once. NOTE: This medicine is only for you. Do not share this medicine with others. What if I miss a dose? If you miss a dose, take it as soon as you can unless it is more than 4 days (96 hours) late. If it is more than 4 days late, skip the missed dose. Take the next dose at the normal time. Do not take 2 doses within 3 days (72 hours) of each other. What may interact with this medication? Certain medications for diabetes, such as insulin, glyburide, glipizide This medication may affect how other medications work. Talk with your care team about all of the medications you take. They may suggest changes to your treatment plan to lower the risk of side effects and to make sure your medications work as intended. This list may not describe all possible interactions. Give your health care provider a list of all the medicines, herbs, non-prescription drugs, or dietary supplements you use. Also tell them if you smoke, drink alcohol, or use illegal drugs. Some items may interact with your medicine. What should I watch for while using this medication? Visit your care team for regular checks on your progress. It may be some time before you see the benefit from this medication. Check with your care team if you have severe diarrhea, nausea, and vomiting, or if you sweat a lot. The loss of too much body fluid may make it dangerous for you to take this medication. Tell your care team if you are taking   medications to treat diabetes, such as insulin or sulfonylureas. This may increase your risk of low blood sugar. Know the symptoms of low blood sugar and how to treat it. Talk to your care team about your risk of cancer. You may be more at risk for certain types of cancer if you take this medication. Estrogen and progestin hormones may not work as well while you are taking this medication. If  you take these as pills by mouth, your care team may recommend another type of contraception for 4 weeks after you start this medication and for 4 weeks after each dose increase. Talk to your care team about contraceptive options. They can help you find the option that works for you. What side effects may I notice from receiving this medication? Side effects that you should report to your care team as soon as possible: Allergic reactions or angioedema--skin rash, itching or hives, swelling of the face, eyes, lips, tongue, arms, or legs, trouble swallowing or breathing Bowel blockage--stomach cramping, unable to have a bowel movement or pass gas, loss of appetite, vomiting Change in vision Dehydration--increased thirst, dry mouth, feeling faint or lightheaded, headache, dark yellow or brown urine Gallbladder problems--severe stomach pain, nausea, vomiting, fever Kidney injury--decrease in the amount of urine, swelling of the ankles, hands, or feet Pancreatitis--severe stomach pain that spreads to your back or gets worse after eating or when touched, fever, nausea, vomiting Thoughts of suicide or self-harm, worsening mood, feelings of depression Thyroid cancer--new mass or lump in the neck, pain or trouble swallowing, trouble breathing, hoarseness Side effects that usually do not require medical attention (report these to your care team if they continue or are bothersome): Constipation Diarrhea Nausea Pain, redness, or irritation at injection site Stomach pain Upset stomach Vomiting This list may not describe all possible side effects. Call your doctor for medical advice about side effects. You may report side effects to FDA at 1-800-FDA-1088. Where should I keep my medication? Keep out of the reach of children and pets. Store in a refrigerator or at room temperature up to 30 degrees C (86 degrees F). Keep it in the original container. Protect from light. Refrigeration (preferred): Store in the  refrigerator. Do not freeze. Get rid of any unused medication after the expiration date. Room temperature: This medication may be stored at room temperature for up to 21 days. If it is stored at room temperature, get rid of any unused medication after 21 days or after it expires, whichever is first. To get rid of medications that are no longer needed or have expired: Take the medication to a medication take-back program. Check with your pharmacy or law enforcement to find a location. If you cannot return the medication, ask your pharmacist or care team how to get rid of this medication safely. NOTE: This sheet is a summary. It may not cover all possible information. If you have questions about this medicine, talk to your doctor, pharmacist, or health care provider.  2023 Elsevier/Gold Standard (2022-10-25 00:00:00)  

## 2023-02-25 NOTE — Progress Notes (Signed)
Subjective:    Patient ID: Rachael Allen, female    DOB: 14-Sep-1971, 52 y.o.   MRN: [id number]   Chief Complaint: annual physical   HPI:  Rachael Allen is a 52 y.o. who identifies as a female who was assigned female at birth.   Social history: Lives with: husband Work history: disability   Comes in today for follow up of the following chronic medical issues:  1. Annual physical exam No pap today  2. Primary hypertension No c/o chest pain, sob or headache. Does not check blood pressure at home. BP Readings from Last 3 Encounters:  02/04/23 117/78  01/14/23 118/80  11/09/22 133/86     3. Intractable chronic migraine without aura and without status migrainosus No recent migraines  4. OSA on CPAP Says she cannot tolerate cpap masks. Gives her anxiety.  5. Diverticulosis No recent flare ups  6. Recurrent major depressive disorder, in full remission (Rachael Allen) 7. Bipolar affective disorder, current episode mixed, current episode severity unspecified (Rachael Allen) Is on latuda and is doing well    02/25/2023   10:25 AM 01/14/2023    9:24 AM 11/09/2022    4:18 PM  Depression screen PHQ 2/9  Decreased Interest 1 0 1  Down, Depressed, Hopeless 0 0 1  PHQ - 2 Score 1 0 2  Altered sleeping 0 1 0  Tired, decreased energy 1 1 1   Change in appetite 0 0 1  Feeling bad or failure about yourself  0 0 0  Trouble concentrating 1 1 1   Moving slowly or fidgety/restless 0 0 0  Suicidal thoughts 0 0 0  PHQ-9 Score 3 3 5   Difficult doing work/chores Somewhat difficult Somewhat difficult Somewhat difficult      02/25/2023   10:25 AM 01/14/2023    9:25 AM 11/09/2022    4:19 PM 09/10/2022    9:24 AM  GAD 7 : Generalized Anxiety Score  Nervous, Anxious, on Edge 1 1 1 1   Control/stop worrying 1 1 1 1   Worry too much - different things 1 1 1 1   Trouble relaxing 0 0 1 0  Restless 0 0 0 0  Easily annoyed or irritable 0 0 0 0  Afraid - awful might happen 0 0  1  Total GAD 7 Score 3 3  4    Anxiety Difficulty Somewhat difficult Somewhat difficult Somewhat difficult Somewhat difficult      8. Fibromyalgia muscle pain Has chronic pain daily. Is on no pain medication  9. Morbid obesity ([place]) Was started on zepbound. Weight is down 7lbs Wt Readings from Last 3 Encounters:  02/04/23 296 lb (134.3 kg)  01/14/23 (!) 301 lb (136.5 kg)  11/09/22 (!) 305 lb (138.3 kg)   BMI Readings from Last 3 Encounters:  02/04/23 49.26 kg/m  01/14/23 50.09 kg/m  11/09/22 50.75 kg/m     New complaints: Intermitttent pelvic pain- started 6 months ago. Occurs daily. Last sevral minutes.  Allergies  Allergen Reactions   Oxycodone-Acetaminophen Itching    vomiting   Nitrofurantoin Other (See Comments)    SEVERE HEADACHE   Benadryl [Diphenhydramine Hcl]    Dilaudid [Hydromorphone Hcl]    Latex    Nsaids Nausea Only    In large doses   Vicodin [Hydrocodone-Acetaminophen]    Outpatient Encounter Medications as of 02/25/2023  Medication Sig   cephALEXin (KEFLEX) 500 MG capsule Take 1 capsule (500 mg total) by mouth 3 (three) times daily.   [facility] B-12 500 MCG tablet Take 500  mcg by mouth daily.   fluconazole (DIFLUCAN) 150 MG tablet Take 1 tablet (150 mg total) by mouth every three (3) days as needed.   fluticasone (FLONASE) 50 MCG/ACT nasal spray Place 2 sprays into both nostrils daily.   hydrocortisone 1 % lotion Apply 1 Application topically 2 (two) times daily.   lisinopril (ZESTRIL) 20 MG tablet Take 1 tablet (20 mg total) by mouth daily.   lurasidone (LATUDA) 40 MG TABS tablet Take 1 tablet (40 mg total) by mouth daily with breakfast. (Patient taking differently: Take 40 mg by mouth daily.)   metFORMIN (GLUCOPHAGE-XR) 500 MG 24 hr tablet Take 1 tablet (500 mg total) by mouth 2 (two) times daily.   sulfamethoxazole-trimethoprim (BACTRIM DS) 800-160 MG tablet Take 1 tablet by mouth 2 (two) times daily. (Patient not taking: Reported on 02/04/2023)   tirzepatide (ZEPBOUND) 2.5  MG/0.5ML Pen Inject 2.5 mg into the skin once a week.   valACYclovir (VALTREX) 1000 MG tablet TAKE 2 TABLETS TWICE A DAY BY MOUTH FOR 1 DAY THEN AS NEEDED FOR FEVER BLISTERS   No facility-administered encounter medications on file as of 02/25/2023.    Past Surgical History:  Procedure Laterality Date   CESAREAN SECTION     TONSILLECTOMY AND ADENOIDECTOMY      Family History  Problem Relation Age of Onset   Breast cancer Mother 36   Hypertension Mother    Mental illness Father    Hypertension Sister       Controlled substance contract: n/a     Review of Systems  Constitutional:  Negative for diaphoresis.  Eyes:  Negative for pain.  Respiratory:  Negative for shortness of breath.   Cardiovascular:  Negative for chest pain, palpitations and leg swelling.  Gastrointestinal:  Negative for abdominal pain.  Endocrine: Negative for polydipsia.  Skin:  Negative for rash.  Neurological:  Negative for dizziness, weakness and headaches.  Hematological:  Does not bruise/bleed easily.  All other systems reviewed and are negative.      Objective:   Physical Exam Vitals and nursing note reviewed.  Constitutional:      General: She is not in acute distress.    Appearance: Normal appearance. She is well-developed.  HENT:     Head: Normocephalic.     Right Ear: Tympanic membrane normal.     Left Ear: Tympanic membrane normal.     Nose: Nose normal.     Mouth/Throat:     Mouth: Mucous membranes are moist.  Eyes:     Pupils: Pupils are equal, round, and reactive to light.  Neck:     Vascular: No carotid bruit or JVD.  Cardiovascular:     Rate and Rhythm: Normal rate and regular rhythm.     Heart sounds: Normal heart sounds.  Pulmonary:     Effort: Pulmonary effort is normal. No respiratory distress.     Breath sounds: Normal breath sounds. No wheezing or rales.  Chest:     Chest wall: No tenderness.  Abdominal:     General: Bowel sounds are normal. There is no distension  or abdominal bruit.     Palpations: Abdomen is soft. There is no hepatomegaly, splenomegaly, mass or pulsatile mass.     Tenderness: There is abdominal tenderness (mild suprapuboc pain on palpation).  Musculoskeletal:        General: Normal range of motion.     Cervical back: Normal range of motion and neck supple.  Lymphadenopathy:     Cervical: No cervical adenopathy.  Skin:    General: Skin is warm and dry.  Neurological:     Mental Status: She is alert and oriented to person, place, and time.     Deep Tendon Reflexes: Reflexes are normal and symmetric.  Psychiatric:        Behavior: Behavior normal.        Thought Content: Thought content normal.        Judgment: Judgment normal.    BP 130/88   Pulse (!) 59   Temp (!) 97.3 F (36.3 C) (Temporal)   Resp 20   Ht 5\' 5"  (1.651 m)   Wt 291 lb (132 kg)   SpO2 97%   BMI 48.42 kg/m   Hgba1c 5.0%      Assessment & Plan:   Rachael Allen comes in today with chief complaint of Medical Management of Chronic Issues   Diagnosis and orders addressed:  1. Annual physical exam   2. Primary hypertension Low odium diet - CBC with Differential/Platelet - CMP14+EGFR - lisinopril (ZESTRIL) 20 MG tablet; Take 1 tablet (20 mg total) by mouth daily.  Dispense: 90 tablet; Refill: 1  3. Intractable chronic migraine without aura and without status migrainosus Avoid caffeine  4. OSA on CPAP Cannot tolerate cpap mask  5. Diverticulosis Watch diet to prevent flare up  6. Recurrent major depressive disorder, in full remission [facility]) Stress management  7. Bipolar affective disorder, current episode mixed, current episode severity unspecified ([place]) Continue latuda as prescribed  8. Fibromyalgia muscle pain Exercise  to keep muscles warm  9. Morbid obesity ([place]) Discussed diet and exercise for person with BMI >25 Will recheck weight in 3-6 months   10. Type 2 diabetes mellitus without complication, without long-term current  use of insulin ([facility]) Continue to watch carbs in diet - Bayer DCA Hb A1c Waived - Lipid panel - Microalbumin / creatinine urine ratio  11. PCOS (polycystic ovarian syndrome) - metFORMIN (GLUCOPHAGE-XR) 500 MG 24 hr tablet; Take 1 tablet (500 mg total) by mouth 2 (two) times daily.  Dispense: 180 tablet; Refill: 1  12. Pelvic pain Will talk after get test results - US Pelvic Complete With Transvaginal; Future   Labs pending Health Maintenance reviewed Diet and exercise encouraged  Follow up plan: 3 months   [place], FNP

## 2023-02-26 LAB — CBC WITH DIFFERENTIAL/PLATELET
Basophils Absolute: 0.1 10*3/uL (ref 0.0–0.2)
Basos: 1 %
EOS (ABSOLUTE): 0.2 10*3/uL (ref 0.0–0.4)
Eos: 3 %
Hematocrit: 40.8 % (ref 34.0–46.6)
Hemoglobin: 13.4 g/dL (ref 11.1–15.9)
Immature Grans (Abs): 0.1 10*3/uL (ref 0.0–0.1)
Immature Granulocytes: 1 %
Lymphocytes Absolute: 2.1 10*3/uL (ref 0.7–3.1)
Lymphs: 29 %
MCH: 27.7 pg (ref 26.6–33.0)
MCHC: 32.8 g/dL (ref 31.5–35.7)
MCV: 85 fL (ref 79–97)
Monocytes Absolute: 0.5 10*3/uL (ref 0.1–0.9)
Monocytes: 7 %
Neutrophils Absolute: 4.3 10*3/uL (ref 1.4–7.0)
Neutrophils: 59 %
Platelets: 261 10*3/uL (ref 150–450)
RBC: 4.83 x10E6/uL (ref 3.77–5.28)
RDW: 14.4 % (ref 11.7–15.4)
WBC: 7.3 10*3/uL (ref 3.4–10.8)

## 2023-02-26 LAB — CMP14+EGFR
ALT: 38 IU/L — ABNORMAL HIGH (ref 0–32)
AST: 22 IU/L (ref 0–40)
Albumin/Globulin Ratio: 1.8 (ref 1.2–2.2)
Albumin: 4.4 g/dL (ref 3.8–4.9)
Alkaline Phosphatase: 64 IU/L (ref 44–121)
BUN/Creatinine Ratio: 8 — ABNORMAL LOW (ref 9–23)
BUN: 7 mg/dL (ref 6–24)
Bilirubin Total: 0.4 mg/dL (ref 0.0–1.2)
CO2: 22 mmol/L (ref 20–29)
Calcium: 9.4 mg/dL (ref 8.7–10.2)
Chloride: 104 mmol/L (ref 96–106)
Creatinine, Ser: 0.9 mg/dL (ref 0.57–1.00)
Globulin, Total: 2.5 g/dL (ref 1.5–4.5)
Glucose: 103 mg/dL — ABNORMAL HIGH (ref 70–99)
Potassium: 4.1 mmol/L (ref 3.5–5.2)
Sodium: 141 mmol/L (ref 134–144)
Total Protein: 6.9 g/dL (ref 6.0–8.5)
eGFR: 77 mL/min/{1.73_m2} (ref >59)

## 2023-02-26 LAB — LIPID PANEL
Chol/HDL Ratio: 3.8 ratio (ref 0.0–4.4)
Cholesterol, Total: 136 mg/dL (ref 100–199)
HDL: 36 mg/dL — ABNORMAL LOW (ref >39)
LDL Chol Calc (NIH): 80 mg/dL (ref 0–99)
Triglycerides: 106 mg/dL (ref 0–149)
VLDL Cholesterol Cal: 20 mg/dL (ref 5–40)

## 2023-02-27 LAB — MICROALBUMIN / CREATININE URINE RATIO
Creatinine, Urine: 92.8 mg/dL
Microalb/Creat Ratio: 9 mg/g creat (ref 0–29)
Microalbumin, Urine: 8.1 ug/mL

## 2023-03-10 ENCOUNTER — Other Ambulatory Visit (HOSPITAL_COMMUNITY): Payer: BC Managed Care – PPO

## 2023-03-14 DIAGNOSIS — M79672 Pain in left foot: Secondary | ICD-10-CM | POA: Diagnosis not present

## 2023-03-18 ENCOUNTER — Ambulatory Visit (HOSPITAL_COMMUNITY): Payer: BC Managed Care – PPO

## 2023-03-24 ENCOUNTER — Ambulatory Visit (HOSPITAL_COMMUNITY): Payer: BC Managed Care – PPO

## 2023-05-30 ENCOUNTER — Telehealth: Payer: Self-pay | Admitting: Nurse Practitioner

## 2023-05-30 ENCOUNTER — Ambulatory Visit (INDEPENDENT_AMBULATORY_CARE_PROVIDER_SITE_OTHER): Payer: BC Managed Care – PPO | Admitting: Nurse Practitioner

## 2023-05-30 ENCOUNTER — Encounter: Payer: Self-pay | Admitting: Nurse Practitioner

## 2023-05-30 VITALS — BP 114/84 | HR 65 | Temp 97.2°F | Resp 20 | Ht 65.0 in | Wt 278.0 lb

## 2023-05-30 DIAGNOSIS — Z6841 Body Mass Index (BMI) 40.0 and over, adult: Secondary | ICD-10-CM | POA: Diagnosis not present

## 2023-05-30 MED ORDER — TIRZEPATIDE 7.5 MG/0.5ML ~~LOC~~ SOAJ: 7.5000 mg | SUBCUTANEOUS | 1 refills | Status: DC

## 2023-05-30 MED ORDER — ZEPBOUND 7.5 MG/0.5ML ~~LOC~~ SOAJ: 7.5000 mg | SUBCUTANEOUS | 1 refills | Status: DC

## 2023-05-30 NOTE — Progress Notes (Signed)
Subjective:    Patient ID: Rachael Allen, female    DOB: [birth year], 52 y.o.   MRN: [id number]   Chief Complaint: Weight Check   HPI  Patient was started on zepbound in march. She has been doing well. Denies any medication side effects. Weight is down 13lbs Wt Readings from Last 3 Encounters:  05/30/23 278 lb (126.1 kg)  02/25/23 291 lb (132 kg)  02/04/23 296 lb (134.3 kg)   BMI Readings from Last 3 Encounters:  05/30/23 46.26 kg/m  02/25/23 48.42 kg/m  02/04/23 49.26 kg/m    Patient Active Problem List   Diagnosis Date Noted   Mass of joint of right hip 07/28/2022   Primary hypertension 08/10/2021   Cervical stenosis of spine 07/03/2020   Diverticulosis 06/29/2017   Morbid obesity ([facility]) 03/24/2016   OSA on CPAP 03/24/2016   Benign paroxysmal positional vertigo due to bilateral vestibular disorder 02/10/2016   Bipolar disorder ([facility]) 01/26/2016   Depression 05/09/2014   Narcolepsy 05/09/2014   IUD (intrauterine device) in place 04/04/2014   Migraines 04/19/2013   Fibromyalgia muscle pain 03/07/2013   PCOS (polycystic ovarian syndrome) 03/07/2013   Fibrocystic breast 01/08/2013  ]    Review of Systems  Constitutional:  Negative for diaphoresis.  Eyes:  Negative for pain.  Respiratory:  Negative for shortness of breath.   Cardiovascular:  Negative for chest pain, palpitations and leg swelling.  Gastrointestinal:  Negative for abdominal pain.  Endocrine: Negative for polydipsia.  Skin:  Negative for rash.  Neurological:  Negative for dizziness, weakness and headaches.  Hematological:  Does not bruise/bleed easily.  All other systems reviewed and are negative.      Objective:   Physical Exam Vitals reviewed.  Constitutional:      Appearance: Normal appearance.  Cardiovascular:     Rate and Rhythm: Normal rate and regular rhythm.     Heart sounds: Normal heart sounds.  Pulmonary:     Effort: Pulmonary effort is normal.     Breath sounds: Normal  breath sounds.  Skin:    General: Skin is warm.  Neurological:     General: No focal deficit present.     Mental Status: She is alert and oriented to person, place, and time.  Psychiatric:        Mood and Affect: Mood normal.        Behavior: Behavior normal.    BP 114/84   Pulse 65   Temp (!) 97.2 F (36.2 C) (Temporal)   Resp 20   Ht 5\' 5"  (1.651 m)   Wt 278 lb (126.1 kg)   SpO2 97%   BMI 46.26 kg/m         Assessment & Plan:   Rachael Allen in today with chief complaint of Weight Check   1. Class 3 severe obesity due to excess calories with serious comorbidity and body mass index (BMI) of 40.0 to 44.9 in adult [facility]) Discussed diet and exercise for person with BMI >25 Will recheck weight in 3-6 months  - tirzepatide (MOUNJARO) 7.5 MG/0.5ML Pen; Inject 7.5 mg into the skin once a week.  Dispense: 6 mL; Refill: 1    The above assessment and management plan was discussed with the patient. The patient verbalized understanding of and has agreed to the management plan. Patient is aware to call the clinic if symptoms persist or worsen. Patient is aware when to return to the clinic for a follow-up visit. Patient educated on when it is appropriate  to go to the emergency department.   Mary-Margaret Hassell Done, FNP

## 2023-05-30 NOTE — Telephone Encounter (Signed)
Patient aware.

## 2023-05-30 NOTE — Telephone Encounter (Signed)
Sorry- my mistake zepbound sent to pharmacy- zepbound and mounjario are the same meds though

## 2023-05-30 NOTE — Telephone Encounter (Signed)
Patient spoke to [facility] and they told her that a PA would need to be started for Zepbound. She stated that they gave her a number [phone]) to call.

## 2023-05-30 NOTE — Patient Instructions (Signed)
Calorie Counting for Weight Loss Calories are units of energy. Your body needs a certain number of calories from food to keep going throughout the day. When you eat or drink more calories than your body needs, your body stores the extra calories mostly as fat. When you eat or drink fewer calories than your body needs, your body burns fat to get the energy it needs. Calorie counting means keeping track of how many calories you eat and drink each day. Calorie counting can be helpful if you need to lose weight. If you eat fewer calories than your body needs, you should lose weight. Ask your health care provider what a healthy weight is for you. For calorie counting to work, you will need to eat the right number of calories each day to lose a healthy amount of weight per week. A dietitian can help you figure out how many calories you need in a day and will suggest ways to reach your calorie goal. A healthy amount of weight to lose each week is usually 1-2 lb (0.5-0.9 kg). This usually means that your daily calorie intake should be reduced by 500-750 calories. Eating 1,200-1,500 calories a day can help most women lose weight. Eating 1,500-1,800 calories a day can help most men lose weight. What do I need to know about calorie counting? Work with your health care provider or dietitian to determine how many calories you should get each day. To meet your daily calorie goal, you will need to: Find out how many calories are in each food that you would like to eat. Try to do this before you eat. Decide how much of the food you plan to eat. Keep a food log. Do this by writing down what you ate and how many calories it had. To successfully lose weight, it is important to balance calorie counting with a healthy lifestyle that includes regular activity. Where do I find calorie information?  The number of calories in a food can be found on a Nutrition Facts label. If a food does not have a Nutrition Facts label, try  to look up the calories online or ask your dietitian for help. Remember that calories are listed per serving. If you choose to have more than one serving of a food, you will have to multiply the calories per serving by the number of servings you plan to eat. For example, the label on a package of bread might say that a serving size is 1 slice and that there are 90 calories in a serving. If you eat 1 slice, you will have eaten 90 calories. If you eat 2 slices, you will have eaten 180 calories. How do I keep a food log? After each time that you eat, record the following in your food log as soon as possible: What you ate. Be sure to include toppings, sauces, and other extras on the food. How much you ate. This can be measured in cups, ounces, or number of items. How many calories were in each food and drink. The total number of calories in the food you ate. Keep your food log near you, such as in a pocket-sized notebook or on an app or website on your mobile phone. Some programs will calculate calories for you and show you how many calories you have left to meet your daily goal. What are some portion-control tips? Know how many calories are in a serving. This will help you know how many servings you can have of a certain   food. Use a measuring cup to measure serving sizes. You could also try weighing out portions on a kitchen scale. With time, you will be able to estimate serving sizes for some foods. Take time to put servings of different foods on your favorite plates or in your favorite bowls and cups so you know what a serving looks like. Try not to eat straight from a food's packaging, such as from a bag or box. Eating straight from the package makes it hard to see how much you are eating and can lead to overeating. Put the amount you would like to eat in a cup or on a plate to make sure you are eating the right portion. Use smaller plates, glasses, and bowls for smaller portions and to prevent  overeating. Try not to multitask. For example, avoid watching TV or using your computer while eating. If it is time to eat, sit down at a table and enjoy your food. This will help you recognize when you are full. It will also help you be more mindful of what and how much you are eating. What are tips for following this plan? Reading food labels Check the calorie count compared with the serving size. The serving size may be smaller than what you are used to eating. Check the source of the calories. Try to choose foods that are high in protein, fiber, and vitamins, and low in saturated fat, trans fat, and sodium. Shopping Read nutrition labels while you shop. This will help you make healthy decisions about which foods to buy. Pay attention to nutrition labels for low-fat or fat-free foods. These foods sometimes have the same number of calories or more calories than the full-fat versions. They also often have added sugar, starch, or salt to make up for flavor that was removed with the fat. Make a grocery list of lower-calorie foods and stick to it. Cooking Try to cook your favorite foods in a healthier way. For example, try baking instead of frying. Use low-fat dairy products. Meal planning Use more fruits and vegetables. One-half of your plate should be fruits and vegetables. Include lean proteins, such as chicken, turkey, and fish. Lifestyle Each week, aim to do one of the following: 150 minutes of moderate exercise, such as walking. 75 minutes of vigorous exercise, such as running. General information Know how many calories are in the foods you eat most often. This will help you calculate calorie counts faster. Find a way of tracking calories that works for you. Get creative. Try different apps or programs if writing down calories does not work for you. What foods should I eat?  Eat nutritious foods. It is better to have a nutritious, high-calorie food, such as an avocado, than a food with  few nutrients, such as a bag of potato chips. Use your calories on foods and drinks that will fill you up and will not leave you hungry soon after eating. Examples of foods that fill you up are nuts and nut butters, vegetables, lean proteins, and high-fiber foods such as whole grains. High-fiber foods are foods with more than 5 g of fiber per serving. Pay attention to calories in drinks. Low-calorie drinks include water and unsweetened drinks. The items listed above may not be a complete list of foods and beverages you can eat. Contact a dietitian for more information. What foods should I limit? Limit foods or drinks that are not good sources of vitamins, minerals, or protein or that are high in unhealthy fats. These   include: Candy. Other sweets. Sodas, specialty coffee drinks, alcohol, and juice. The items listed above may not be a complete list of foods and beverages you should avoid. Contact a dietitian for more information. How do I count calories when eating out? Pay attention to portions. Often, portions are much larger when eating out. Try these tips to keep portions smaller: Consider sharing a meal instead of getting your own. If you get your own meal, eat only half of it. Before you start eating, ask for a container and put half of your meal into it. When available, consider ordering smaller portions from the menu instead of full portions. Pay attention to your food and drink choices. Knowing the way food is cooked and what is included with the meal can help you eat fewer calories. If calories are listed on the menu, choose the lower-calorie options. Choose dishes that include vegetables, fruits, whole grains, low-fat dairy products, and lean proteins. Choose items that are boiled, broiled, grilled, or steamed. Avoid items that are buttered, battered, fried, or served with cream sauce. Items labeled as crispy are usually fried, unless stated otherwise. Choose water, low-fat milk,  unsweetened iced tea, or other drinks without added sugar. If you want an alcoholic beverage, choose a lower-calorie option, such as a glass of wine or light beer. Ask for dressings, sauces, and syrups on the side. These are usually high in calories, so you should limit the amount you eat. If you want a salad, choose a garden salad and ask for grilled meats. Avoid extra toppings such as bacon, cheese, or fried items. Ask for the dressing on the side, or ask for olive oil and vinegar or lemon to use as dressing. Estimate how many servings of a food you are given. Knowing serving sizes will help you be aware of how much food you are eating at restaurants. Where to find more information Centers for Disease Control and Prevention: www.cdc.gov [place] Department of Agriculture: myplate.gov Summary Calorie counting means keeping track of how many calories you eat and drink each day. If you eat fewer calories than your body needs, you should lose weight. A healthy amount of weight to lose per week is usually 1-2 lb (0.5-0.9 kg). This usually means reducing your daily calorie intake by 500-750 calories. The number of calories in a food can be found on a Nutrition Facts label. If a food does not have a Nutrition Facts label, try to look up the calories online or ask your dietitian for help. Use smaller plates, glasses, and bowls for smaller portions and to prevent overeating. Use your calories on foods and drinks that will fill you up and not leave you hungry shortly after a meal. This information is not intended to replace advice given to you by your health care provider. Make sure you discuss any questions you have with your health care provider. Document Revised: 01/10/2020 Document Reviewed: 01/10/2020 Elsevier Patient Education  2023 Elsevier Inc.  

## 2023-05-30 NOTE — Telephone Encounter (Signed)
Meds ordered this encounter  Medications   tirzepatide (ZEPBOUND) 7.5 MG/0.5ML Pen    Sig: Inject 7.5 mg into the skin once a week.    Dispense:  6 mL    Refill:  1    Order Specific Question:   Supervising Provider    Answer:   Nils Pyle [[id number]]   Mary-Margaret Daphine Deutscher, FNP

## 2023-05-31 ENCOUNTER — Other Ambulatory Visit (HOSPITAL_COMMUNITY): Payer: Self-pay

## 2023-05-31 NOTE — Telephone Encounter (Signed)
Ran test claim, received paid claim for 1 month supply. Called [facility], they were able to fill prescription for 1 month supply. PA is good until 08/14/23

## 2023-06-11 ENCOUNTER — Other Ambulatory Visit (HOSPITAL_COMMUNITY): Payer: Self-pay

## 2023-06-15 ENCOUNTER — Other Ambulatory Visit: Payer: Self-pay | Admitting: Nurse Practitioner

## 2023-06-15 DIAGNOSIS — I10 Essential (primary) hypertension: Secondary | ICD-10-CM

## 2023-06-23 ENCOUNTER — Telehealth: Payer: Self-pay | Admitting: Nurse Practitioner

## 2023-06-23 MED ORDER — ZEPBOUND 5 MG/0.5ML ~~LOC~~ SOAJ: 5.0000 mg | SUBCUTANEOUS | 1 refills | Status: DC

## 2023-06-23 NOTE — Telephone Encounter (Signed)
Spoke with patient and she states to go ahead and send in the 5mg  so she will have those just in case

## 2023-06-23 NOTE — Telephone Encounter (Signed)
Pt says that tirzepatide (ZEPBOUND) 7.5 MG/0.5ML Pen is on back order and wants to know what MMM recommends please call back

## 2023-06-23 NOTE — Telephone Encounter (Signed)
Will hav e to go back to 5mg  dose- she may still have prescription at pharmacy- let me know if doesnt

## 2023-06-28 ENCOUNTER — Ambulatory Visit (HOSPITAL_COMMUNITY)
Admission: RE | Admit: 2023-06-28 | Discharge: 2023-06-28 | Disposition: A | Discharge status: Home / Self Care | Payer: BC Managed Care – PPO | Source: Ambulatory Visit | Attending: Nurse Practitioner | Admitting: Nurse Practitioner

## 2023-06-28 DIAGNOSIS — R102 Pelvic and perineal pain: Secondary | ICD-10-CM | POA: Insufficient documentation

## 2023-06-28 DIAGNOSIS — R9389 Abnormal findings on diagnostic imaging of other specified body structures: Secondary | ICD-10-CM | POA: Diagnosis not present

## 2023-06-28 DIAGNOSIS — N888 Other specified noninflammatory disorders of cervix uteri: Secondary | ICD-10-CM | POA: Diagnosis not present

## 2023-06-30 ENCOUNTER — Telehealth: Payer: Self-pay | Admitting: Nurse Practitioner

## 2023-06-30 NOTE — Telephone Encounter (Signed)
Have we received form

## 2023-07-01 NOTE — Telephone Encounter (Signed)
Waiting to receive form. Patient states that she will have them resend

## 2023-07-04 ENCOUNTER — Telehealth: Payer: Self-pay | Admitting: Nurse Practitioner

## 2023-07-04 MED ORDER — ZEPBOUND 10 MG/0.5ML ~~LOC~~ SOAJ: 10.0000 mg | SUBCUTANEOUS | 2 refills | Status: DC

## 2023-07-04 NOTE — Telephone Encounter (Signed)
Zepbound 10mg  dose sent to pharmacy. May have lots on indigestion and or nausea with this increase.

## 2023-07-04 NOTE — Telephone Encounter (Signed)
Contacted [facility] and went through PA process. They denied medication at the end because they said patient has not missed two consecutive doses. [facility] stated that [facility] would [facility] the 10mg  with no PA if it was sent to the pharmacy. [facility]. Advised to contact the office with any further problems. Patient verbalized understanding

## 2023-07-04 NOTE — Telephone Encounter (Signed)
Pt needs Patient level authorization for tirzepatide (ZEPBOUND) 7.5 MG/0.5ML Pen because the rx is IN STOCK. The fax is not going through and this [facility] will have [phone] [facility]  Pt is completely out of rx and is due to her shot tomorrow. Please call when done

## 2023-07-04 NOTE — Addendum Note (Signed)
Addended by: Cleda Daub on: 07/04/2023 02:00 PM   Modules accepted: Orders

## 2023-07-04 NOTE — Telephone Encounter (Signed)
Zepbound 10mg  injection sent to pharmacy

## 2023-07-05 ENCOUNTER — Telehealth: Payer: Self-pay | Admitting: Nurse Practitioner

## 2023-07-05 DIAGNOSIS — M79671 Pain in right foot: Secondary | ICD-10-CM | POA: Diagnosis not present

## 2023-07-05 MED ORDER — ZEPBOUND 10 MG/0.5ML ~~LOC~~ SOAJ
10.0000 mg | SUBCUTANEOUS | 2 refills | Status: DC
Start: 2023-07-05 — End: 2023-07-27

## 2023-07-05 NOTE — Telephone Encounter (Signed)
[facility]  on back order --pharmacy has been trying to get since 06/21/23 Zepbound 7.5mg  is in [employer] is showing $1200 copay for the 7.5mg  weekly (likely because the patient needs to increase dose) Patient is #2 in line for the 10mg  dose but we don't know release date Instructed patient to call around to see who the 10mg  dose

## 2023-07-14 ENCOUNTER — Ambulatory Visit (INDEPENDENT_AMBULATORY_CARE_PROVIDER_SITE_OTHER): Payer: BC Managed Care – PPO | Admitting: Podiatry

## 2023-07-14 ENCOUNTER — Encounter: Payer: Self-pay | Admitting: Podiatry

## 2023-07-14 DIAGNOSIS — D2372 Other benign neoplasm of skin of left lower limb, including hip: Secondary | ICD-10-CM

## 2023-07-14 DIAGNOSIS — D2371 Other benign neoplasm of skin of right lower limb, including hip: Secondary | ICD-10-CM

## 2023-07-15 ENCOUNTER — Telehealth: Payer: Self-pay | Admitting: Nurse Practitioner

## 2023-07-15 DIAGNOSIS — R110 Nausea: Secondary | ICD-10-CM

## 2023-07-15 MED ORDER — ONDANSETRON HCL 4 MG PO TABS: 4.0000 mg | ORAL_TABLET | Freq: Three times a day (TID) | ORAL | 0 refills | Status: DC | PRN

## 2023-07-15 NOTE — Telephone Encounter (Signed)
Pt aware zofran was sent in.

## 2023-07-15 NOTE — Telephone Encounter (Signed)
Pt asking for rx for nausea due to zepbound rx. Pt aware MMM is off and will be back Monday. Use [facility]

## 2023-07-16 NOTE — Progress Notes (Signed)
She presents today chief concern of a painful callus to the plantar medial aspect of the first metatarsophalangeal joint bilateral right greater than left states that she had a pair shoes that rubbed it several months ago and she now she has been having to treat it multiple times.  Objective: Vital signs are stable she is alert and oriented x 3.  Pulses are palpable.  She has mild hallux valgus deformity with hallux limitus resulting in the hyperkeratotic buildup around the metatarsal phalangeal joint.  Assessment: Benign skin lesion.  Plan: Debrided benign skin lesion.

## 2023-07-18 ENCOUNTER — Telehealth: Payer: Self-pay

## 2023-07-18 NOTE — Telephone Encounter (Signed)
Rachael Allen (Key: [id number]) PA Case ID #: [id number] Need Help? Call us at [phone] Status sent iconSent to Plan today Drug Zepbound 2.5MG /0.5ML pen-injectors ePA cloud logo Form [facility] [employer] Electronic PA Form [phone] NCPDP)

## 2023-07-19 NOTE — Telephone Encounter (Signed)
WUJWJX:[id number];AOZHYQ:[id number];Review Type:Prior Auth;Coverage Start Date:06/18/2023;Coverage End Date:03/14/2024;

## 2023-07-27 ENCOUNTER — Other Ambulatory Visit (HOSPITAL_COMMUNITY): Payer: Self-pay

## 2023-07-27 ENCOUNTER — Telehealth: Payer: Self-pay | Admitting: Nurse Practitioner

## 2023-07-27 MED ORDER — ZEPBOUND 10 MG/0.5ML ~~LOC~~ SOAJ
10.0000 mg | SUBCUTANEOUS | 2 refills | Status: DC
Start: 2023-07-27 — End: 2023-08-02
  Filled 2023-07-27 (×2): qty 2, 28d supply, fill #0

## 2023-07-27 NOTE — Telephone Encounter (Signed)
Patient aware that rx sent to [facility] for her.

## 2023-07-27 NOTE — Telephone Encounter (Signed)
Pt called to let PCP know that [facility] is out of network with her insurance and all other pharmacies that are in network with her insurance does not have the medicine in stock. Pt needs advise on what to do.

## 2023-07-27 NOTE — Telephone Encounter (Signed)
Patient calling to let PCP know that [facility] does not have Zepbound 10.0 (this is what patient said) but [facility] has it and she needs it sent there.

## 2023-07-28 NOTE — Telephone Encounter (Signed)
Left message to call back  

## 2023-07-28 NOTE — Telephone Encounter (Signed)
Not anything can do. Just hav eto keep contacting  pharmacy. Do they have lower dose or higher dose in stock?

## 2023-08-01 NOTE — Telephone Encounter (Signed)
Pt wants to know since she can't find a pharmacy that has Zepbound in stock, what are her other options?

## 2023-08-01 NOTE — Telephone Encounter (Signed)
We would have to change her to [facility]- having same issues getting that med as well. Will probably have to have anotherprior auth done as well. Would she like for [place]  to try that?

## 2023-08-01 NOTE — Telephone Encounter (Signed)
Do you want to try and change her to something else?

## 2023-08-01 NOTE — Telephone Encounter (Signed)
Patient return call. ?

## 2023-08-02 ENCOUNTER — Other Ambulatory Visit: Payer: Self-pay | Admitting: Nurse Practitioner

## 2023-08-02 ENCOUNTER — Telehealth: Payer: Self-pay | Admitting: Nurse Practitioner

## 2023-08-02 MED ORDER — ZEPBOUND 12.5 MG/0.5ML ~~LOC~~ SOAJ: 12.5000 mg | SUBCUTANEOUS | 3 refills | Status: DC

## 2023-08-02 NOTE — Telephone Encounter (Signed)
Can we increase her zepbound to 12.5mg  since she has been on the 10mg  for 2 months and [facility] 12.5 dose

## 2023-08-02 NOTE — Telephone Encounter (Signed)
[facility] be able to [facility] it.

## 2023-08-02 NOTE — Telephone Encounter (Signed)
Will try and see if available

## 2023-08-02 NOTE — Progress Notes (Signed)
Meds ordered this encounter  Medications   tirzepatide (ZEPBOUND) 12.5 MG/0.5ML Pen    Sig: Inject 12.5 mg into the skin once a week.    Dispense:  2 mL    Refill:  3    Order Specific Question:   Supervising Provider    Answer:   Nils Pyle [[id number]]   Mary-Margaret Daphine Deutscher, FNP

## 2023-08-02 NOTE — Telephone Encounter (Signed)
Pt has been on zepbound for 2 months and wants to know if she can go up to the 12.5mg  dose. Advised pt to call and [facility] and if they do will route to MMM to send in higher dose if appropriate.

## 2023-08-02 NOTE — Telephone Encounter (Signed)
[facility]

## 2023-08-02 NOTE — Telephone Encounter (Signed)
Patient will call the pharmacy to see what they have and call us back before we call in anything else for her

## 2023-08-05 ENCOUNTER — Telehealth: Payer: Self-pay | Admitting: Nurse Practitioner

## 2023-08-23 ENCOUNTER — Ambulatory Visit: Payer: BC Managed Care – PPO | Admitting: Nurse Practitioner

## 2023-08-23 ENCOUNTER — Other Ambulatory Visit: Payer: Self-pay | Admitting: Nurse Practitioner

## 2023-08-23 DIAGNOSIS — Z6841 Body Mass Index (BMI) 40.0 and over, adult: Secondary | ICD-10-CM | POA: Diagnosis not present

## 2023-08-23 DIAGNOSIS — M797 Fibromyalgia: Secondary | ICD-10-CM | POA: Diagnosis not present

## 2023-08-23 MED ORDER — TIRZEPATIDE-WEIGHT MANAGEMENT 2.5 MG/0.5ML ~~LOC~~ SOLN: 2.5000 mg | SUBCUTANEOUS | 2 refills | Status: DC

## 2023-08-23 MED ORDER — KETOROLAC TROMETHAMINE 60 MG/2ML IM SOLN
60.0000 mg | Freq: Once | INTRAMUSCULAR | Status: AC
Start: 2023-08-23 — End: 2023-08-23
  Administered 2023-08-23: 60 mg via INTRAMUSCULAR

## 2023-08-23 NOTE — Progress Notes (Signed)
Subjective:    Patient ID: Rachael Allen, female    DOB: 09/06/1971, 52 y.o.   MRN: [id number]   Chief Complaint: obesity  HPI  Patient come sin today for follow up of obesity. She has been on zepbound for several months now. Says she is doing well with no side effects. She was on 10mg  but she can't get it and the 12.5mg  made her sick. Weight is down 12lbs since last visit. Has been off of meds since first of August. Would like to start back on low dose. Wt Readings from Last 3 Encounters:  08/23/23 266 lb (120.7 kg)  05/30/23 278 lb (126.1 kg)  02/25/23 291 lb (132 kg)    Fibromyalgia is acting up the last several days. Rates pain 6/10 currently. Tordal helps  Patient Active Problem List   Diagnosis Date Noted   Pain in left foot 02/11/2023   Lumbar radiculopathy 10/18/2022   Low back pain 09/02/2022   Mass of joint of right hip 07/28/2022   Primary hypertension 08/10/2021   Cervical stenosis of spine 07/03/2020   Diverticulosis 06/29/2017   Morbid obesity (HCC) 03/24/2016   OSA on CPAP 03/24/2016   Benign paroxysmal positional vertigo due to bilateral vestibular disorder 02/10/2016   Bipolar disorder (HCC) 01/26/2016   Depression 05/09/2014   Narcolepsy 05/09/2014   IUD (intrauterine device) in place 04/04/2014   Migraines 04/19/2013   Fibromyalgia muscle pain 03/07/2013   PCOS (polycystic ovarian syndrome) 03/07/2013   Fibrocystic breast 01/08/2013       Review of Systems  Constitutional:  Negative for diaphoresis.  Eyes:  Negative for pain.  Respiratory:  Negative for shortness of breath.   Cardiovascular:  Negative for chest pain, palpitations and leg swelling.  Gastrointestinal:  Negative for abdominal pain.  Endocrine: Negative for polydipsia.  Skin:  Negative for rash.  Neurological:  Negative for dizziness, weakness and headaches.  Hematological:  Does not bruise/bleed easily.  All other systems reviewed and are negative.      Objective:   Physical  Exam Vitals and nursing note reviewed.  Constitutional:      General: She is not in acute distress.    Appearance: Normal appearance. She is well-developed.  Neck:     Vascular: No carotid bruit or JVD.  Cardiovascular:     Rate and Rhythm: Normal rate and regular rhythm.     Heart sounds: Normal heart sounds.  Pulmonary:     Effort: Pulmonary effort is normal. No respiratory distress.     Breath sounds: Normal breath sounds. No wheezing or rales.  Chest:     Chest wall: No tenderness.  Abdominal:     General: Bowel sounds are normal. There is no distension or abdominal bruit.     Palpations: Abdomen is soft. There is no hepatomegaly, splenomegaly, mass or pulsatile mass.     Tenderness: There is no abdominal tenderness.  Musculoskeletal:        General: Normal range of motion.     Cervical back: Normal range of motion and neck supple.  Lymphadenopathy:     Cervical: No cervical adenopathy.  Skin:    General: Skin is warm and dry.  Neurological:     Mental Status: She is alert and oriented to person, place, and time.     Deep Tendon Reflexes: Reflexes are normal and symmetric.  Psychiatric:        Behavior: Behavior normal.        Thought Content: Thought content normal.  Judgment: Judgment normal.    BP 115/73   Pulse 63   Temp (!) 97.5 F (36.4 C) (Temporal)   Resp 20   Ht 5\' 5"  (1.651 m)   Wt 266 lb (120.7 kg)   SpO2 98%   BMI 44.26 kg/m         Assessment & Plan:   Rachael Allen in today with chief complaint of Disuss weight loss and Discuss fibromyalgia   1. Morbid obesity (HCC) Continue weight watchers Increase fiber in diet  Meds ordered this encounter  Medications   Tirzepatide-Weight Management 2.5 MG/0.5ML SOLN    Sig: Inject 2.5 mg into the skin once a week.    Dispense:  2 mL    Refill:  2    Order Specific Question:   Supervising Provider    Answer:   Arville Care A [[id number]]     2. Fibromyalgia Toradol injection  today  The above assessment and management plan was discussed with the patient. The patient verbalized understanding of and has agreed to the management plan. Patient is aware to call the clinic if symptoms persist or worsen. Patient is aware when to return to the clinic for a follow-up visit. Patient educated on when it is appropriate to go to the emergency department.   Mary-Margaret Daphine Deutscher, FNP

## 2023-08-23 NOTE — Telephone Encounter (Signed)
ZEPBOUND 2.5 MG/0.5ML SOLN        Changed from: Tirzepatide-Weight Management 2.5 MG/0.5ML SOLN    Pharmacy comment: Alternative Requested:DRUGNOT COVERED; PLEASE CONTACT INSURANCE OR CONSIDER ALTERNATIVE.

## 2023-08-23 NOTE — Patient Instructions (Signed)
Myofascial Pain Syndrome and Fibromyalgia Myofascial pain syndrome and fibromyalgia are both pain disorders. You may feel this pain mainly in your muscles. Myofascial pain syndrome: Always has tender points in the muscles that will cause pain when pressed (trigger points). The pain may come and go. Usually affects your neck, upper back, and shoulder areas. The pain often moves into your arms and hands. Fibromyalgia: Has muscle pains and tenderness that come and go. Is often associated with tiredness (fatigue) and sleep problems. Has trigger points. Tends to be long-lasting (chronic), but is not life-threatening. Fibromyalgia and myofascial pain syndrome are not the same. However, they often occur together. If you have both conditions, each can make the other worse. Both are common and can cause enough pain and fatigue to make day-to-day activities difficult. Both can be hard to diagnose because their symptoms are common in many other conditions. What are the causes? The exact causes of these conditions are not known. What increases the risk? You are more likely to develop either of these conditions if: You have a family history of the condition. You are female. You have certain triggers, such as: Spine disorders. An injury (trauma) or other physical stressors. Being under a lot of stress. Medical conditions such as osteoarthritis, rheumatoid arthritis, or lupus. What are the signs or symptoms? Fibromyalgia The main symptom of fibromyalgia is widespread pain and tenderness in your muscles. Pain is sometimes described as stabbing, shooting, or burning. You may also have: Tingling or numbness. Sleep problems and fatigue. Problems with attention and concentration (fibro fog). Other symptoms may include: Bowel and bladder problems. Headaches. Vision problems. Sensitivity to odors and noises. Depression or mood changes. Painful menstrual periods (dysmenorrhea). Dry skin or eyes. These  symptoms can vary over time. Myofascial pain syndrome Symptoms of myofascial pain syndrome include: Tight, ropy bands of muscle. Uncomfortable sensations in muscle areas. These may include aching, cramping, burning, numbness, tingling, and weakness. Difficulty moving certain parts of the body freely (poor range of motion). How is this diagnosed? This condition may be diagnosed by your symptoms and medical history. You will also have a physical exam. In general: Fibromyalgia is diagnosed if you have pain, fatigue, and other symptoms for more than 3 months, and symptoms cannot be explained by another condition. Myofascial pain syndrome is diagnosed if you have trigger points in your muscles, and those trigger points are tender and cause pain elsewhere in your body (referred pain). How is this treated? Treatment for these conditions depends on the type that you have. For fibromyalgia, a healthy lifestyle is the most important treatment including aerobic and strength exercises. Different types of medicines are used to help treat pain and include: NSAIDs. Medicines for treating depression. Medicines that help control seizures. Medicines that relax the muscles. Treatment for myofascial pain syndrome includes: Pain medicines, such as NSAIDs. Cooling and stretching of muscles. Massage therapy with myofascial release technique. Trigger point injections. Treating these conditions often requires a team of health care providers. These may include: Your primary care provider. A physical therapist. Complementary health care providers, such as massage therapists or acupuncturists. A psychiatrist for cognitive behavioral therapy. Follow these instructions at home: Medicines Take over-the-counter and prescription medicines only as told by your health care provider. Ask your health care provider if the medicine prescribed to you: Requires you to avoid driving or using machinery. Can cause constipation.  You may need to take these actions to prevent or treat constipation: Drink enough fluid to keep your urine pale   yellow. Take over-the-counter or prescription medicines. Eat foods that are high in fiber, such as beans, whole grains, and fresh fruits and vegetables. Limit foods that are high in fat and processed sugars, such as fried or sweet foods. Lifestyle  Do exercises as told by your health care provider or physical therapist. Practice relaxation techniques to control your stress. You may want to try: Biofeedback. Visual imagery. Hypnosis. Muscle relaxation. Yoga. Meditation. Maintain a healthy lifestyle. This includes eating a healthy diet and getting enough sleep. Do not use any products that contain nicotine or tobacco. These products include cigarettes, chewing tobacco, and vaping devices, such as e-cigarettes. If you need help quitting, ask your health care provider. General instructions Talk to your health care provider about complementary treatments, such as acupuncture or massage. Do not do activities that stress or strain your muscles. This includes repetitive motions and heavy lifting. Keep all follow-up visits. This is important. Where to find support Consider joining a support group with others who are diagnosed with this condition. National Fibromyalgia Association: fmaware.org Where to find more information U.S. Pain Foundation: uspainfoundation.org Contact a health care provider if: You have new symptoms. Your symptoms get worse or your pain is severe. You have side effects from your medicines. You have trouble sleeping. Your condition is causing depression or anxiety. Get help right away if: You have thoughts of hurting yourself or others. Get help right away if you feel like you may hurt yourself or others, or have thoughts about taking your own life. Go to your nearest emergency room or: Call 911. Call the National Suicide Prevention Lifeline at 1-800-273-8255  or 988. This is open 24 hours a day. Text the Crisis Text Line at 741741. This information is not intended to replace advice given to you by your health care provider. Make sure you discuss any questions you have with your health care provider. Document Revised: 09/06/2022 Document Reviewed: 10/30/2021 Elsevier Patient Education  2024 Elsevier Inc.  

## 2023-08-25 IMAGING — MG MM DIGITAL SCREENING BILAT W/ TOMO AND CAD
8 series · 8 of 24 positions shown · non-contrast
Comparison: Previous exam(s).

CLINICAL DATA: Screening.

EXAM:
DIGITAL SCREENING BILATERAL MAMMOGRAM WITH TOMOSYNTHESIS AND CAD
TECHNIQUE: Bilateral screening digital craniocaudal and mediolateral oblique
mammograms were obtained. Bilateral screening digital breast
tomosynthesis was performed. The images were evaluated with
computer-aided detection.

[L CC synth-2D]
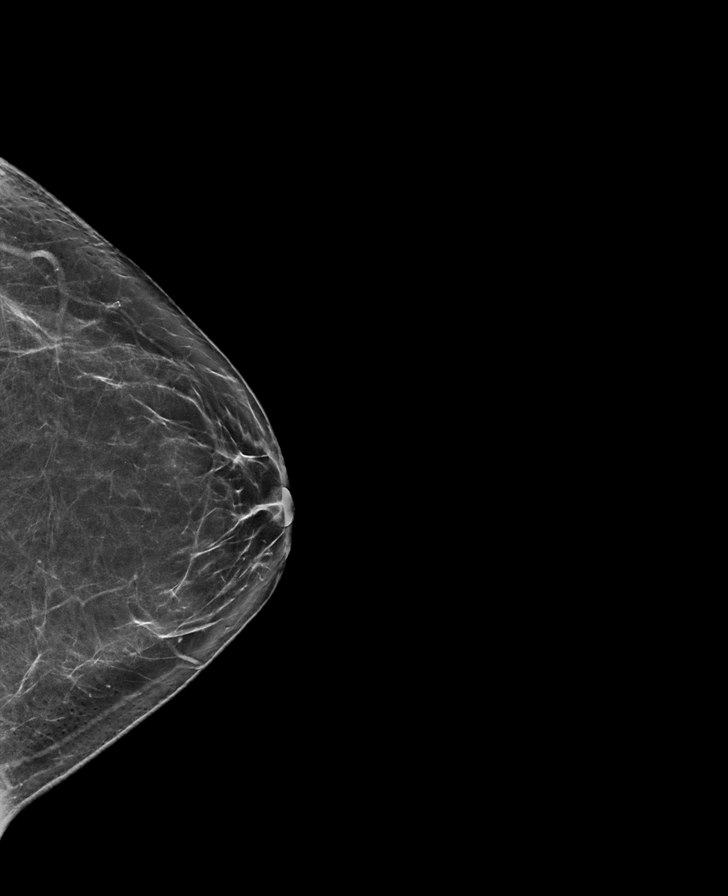

[L MLO synth-2D]
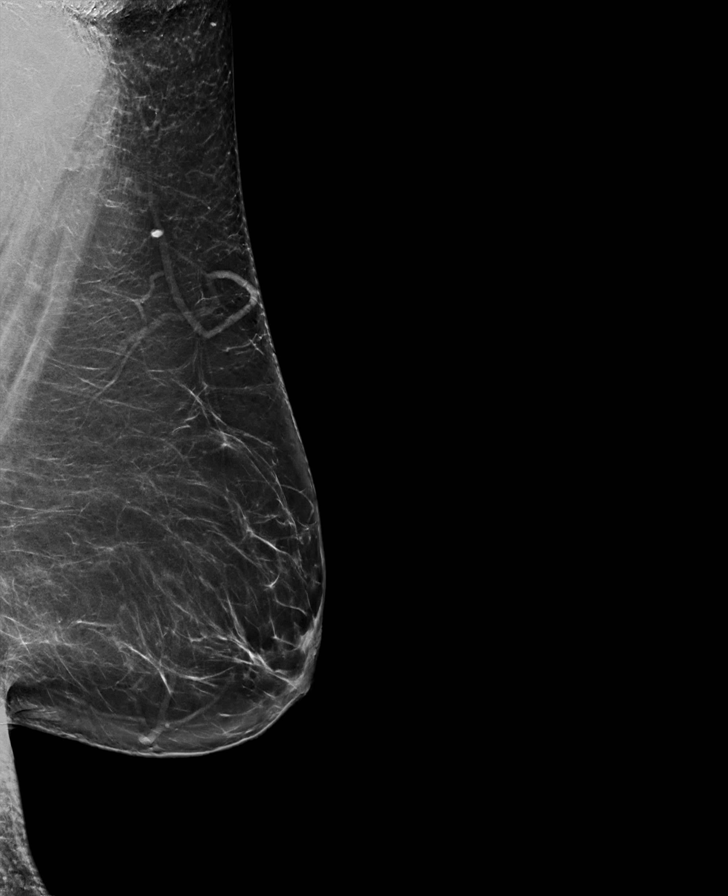

[R MLO synth-2D]
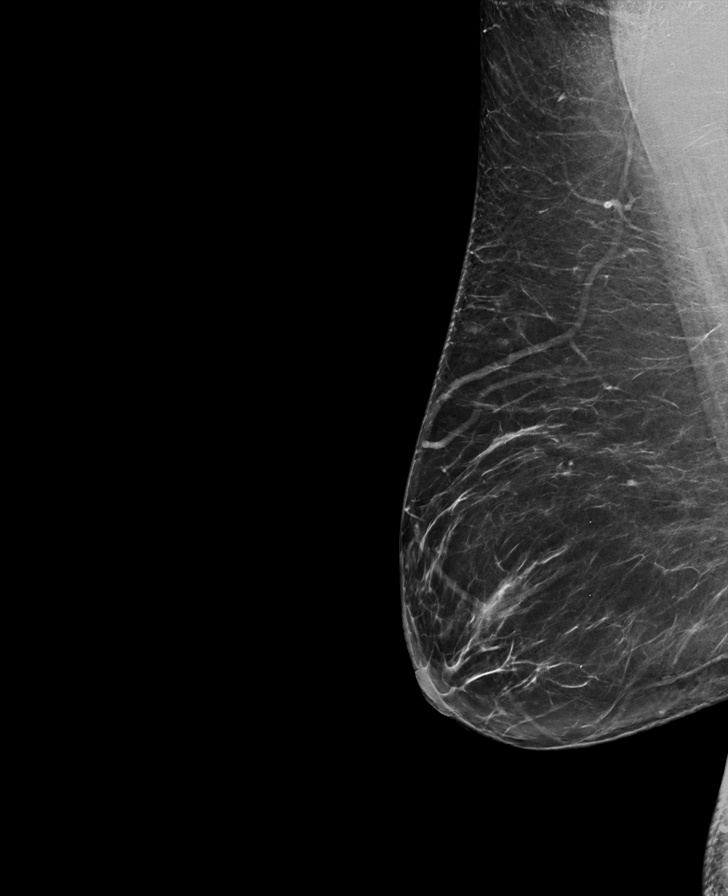

[R CC synth-2D]
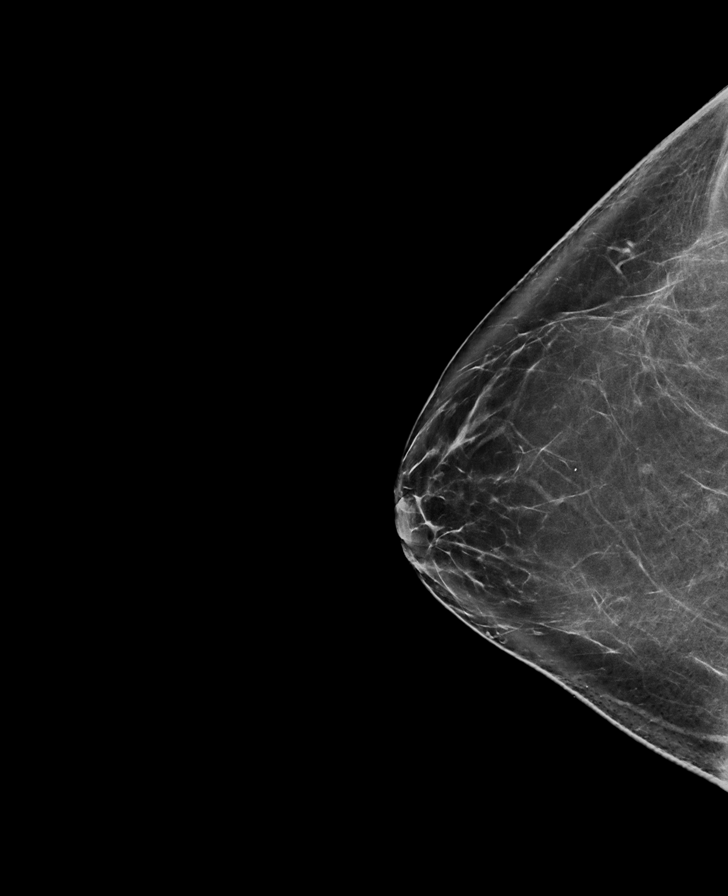

[L MLO tomo · tomo slice 42/83.0]
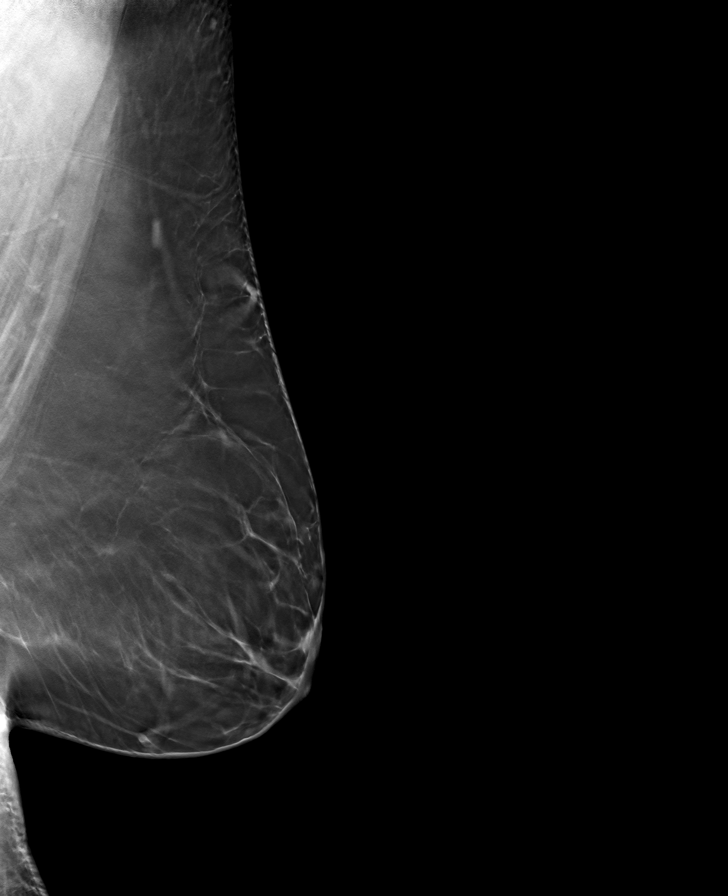

[R CC tomo · tomo slice 41/82.0]
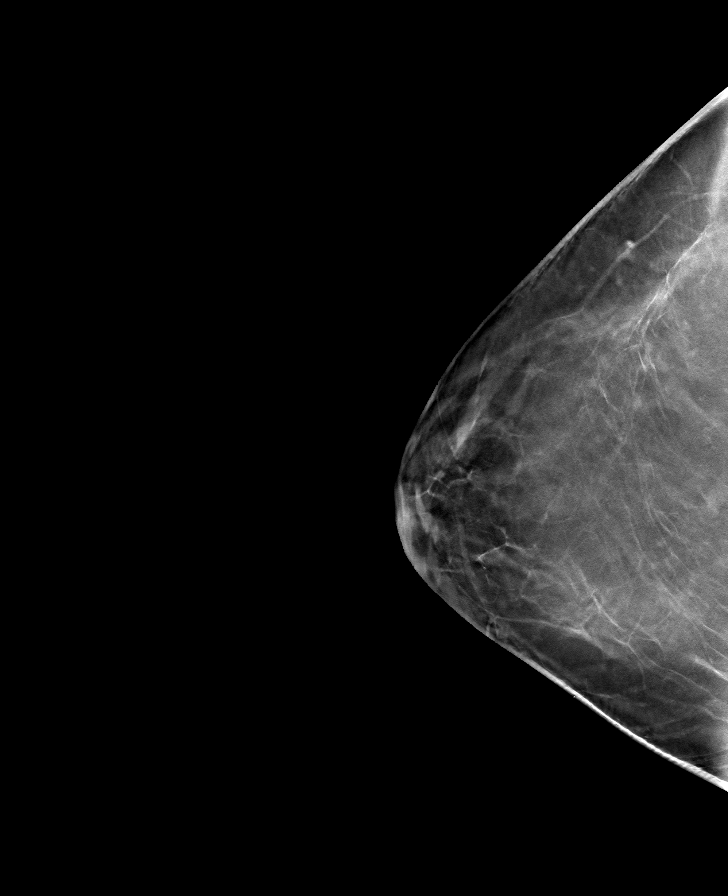

[R MLO tomo · tomo slice 39/78.0]
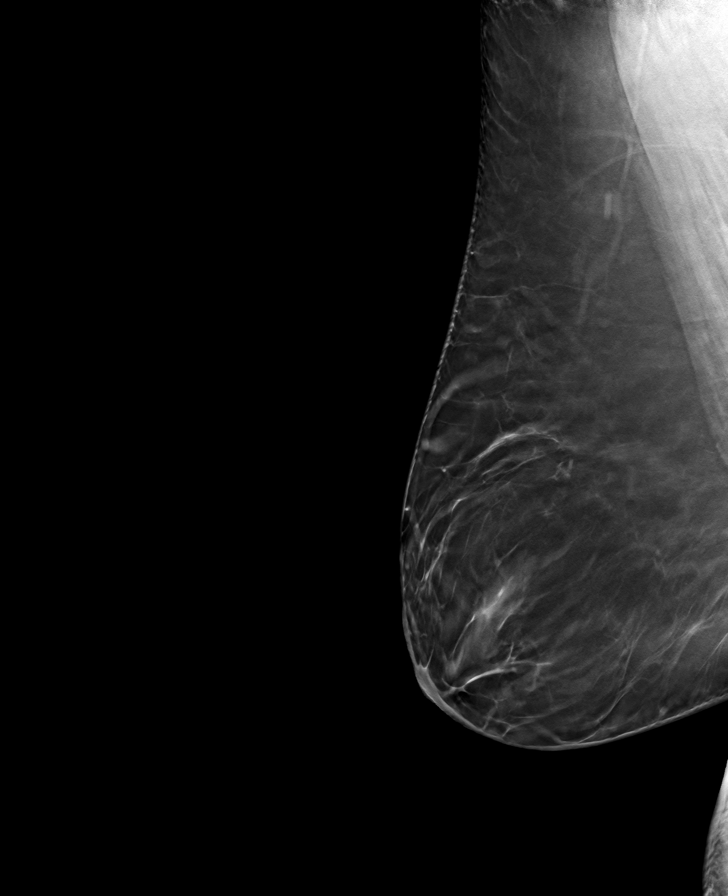

[L CC tomo · tomo slice 38/75.0]
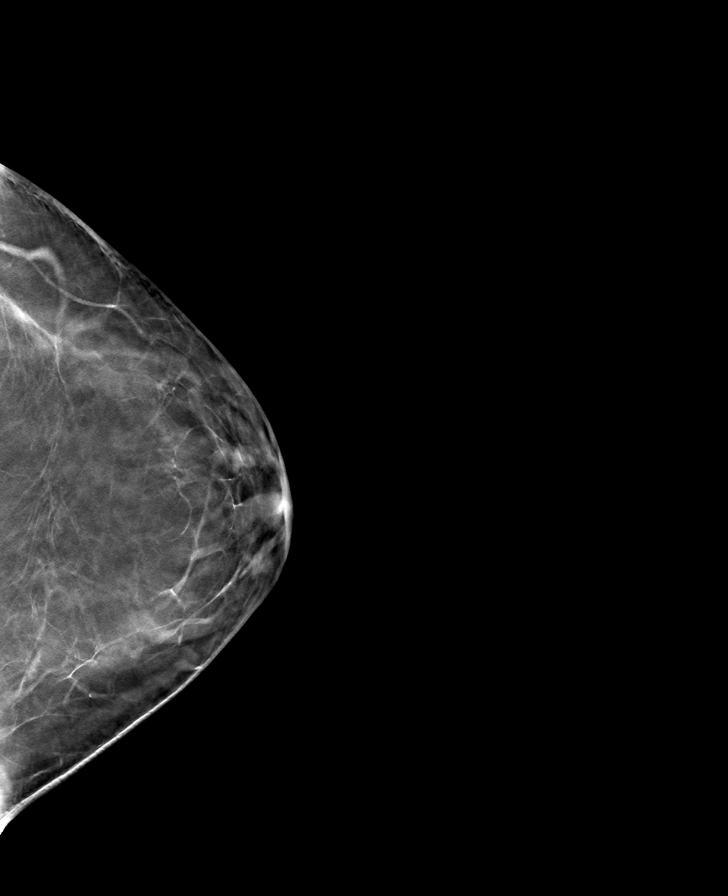

[8 of 24 positions shown; findings below may reference images not displayed]

ACR Breast Density Category b: There are scattered areas of
fibroglandular density.
FINDINGS: There are no findings suspicious for malignancy.
IMPRESSION: No mammographic evidence of malignancy. A result letter of this
screening mammogram will be mailed directly to the patient.

RECOMMENDATION:
Screening mammogram in one year. (Code:51-O-LD2)

BI-RADS CATEGORY  1: Negative.

## 2023-08-25 NOTE — Telephone Encounter (Signed)
Pt calling says that pharmacy told her that this rx needs PA

## 2023-08-29 ENCOUNTER — Telehealth: Payer: Self-pay

## 2023-08-29 ENCOUNTER — Other Ambulatory Visit (HOSPITAL_COMMUNITY): Payer: Self-pay

## 2023-08-29 ENCOUNTER — Ambulatory Visit: Payer: Medicare Other | Admitting: Nurse Practitioner

## 2023-08-29 NOTE — Telephone Encounter (Signed)
Pharmacy Patient Advocate Encounter  Received notification from [employer] that Prior Authorization for Zepbound 2.5mg /0.33ml has been APPROVED from 06/18/23 to 03/14/24   PA #/Case ID/Reference #: [id number]  Per test claim, Plan limitations exceeded for the 2.5mg  of the Zepbound but will pay for the next dose which is the 5mg .

## 2023-09-05 NOTE — Telephone Encounter (Signed)
Pt called stating that she has spoken with insurance and needs [place] to send them a Patient level authorization for the 2.5mg .   [phone] is the # if you need to call about it.

## 2023-09-07 NOTE — Telephone Encounter (Signed)
Pt calling in today for an update today on the PA. Please call back

## 2023-09-08 ENCOUNTER — Other Ambulatory Visit (HOSPITAL_COMMUNITY): Payer: Self-pay

## 2023-09-08 NOTE — Telephone Encounter (Addendum)
Pharmacy Patient Advocate Encounter  Received notification from [employer] that Prior Authorization for  Zepbound 2.5mg /0.66ml  has been APPROVED from 08/09/23 to 10/08/23. Ran test claim, Copay is $24.99. This test claim was processed through [facility]- copay amounts may vary at other pharmacies due to pharmacy/plan contracts, or as the patient moves through the different stages of their insurance plan.   PA #/Case ID/Reference #: [id number]   Approved for 1 fill only (4 pens). Then patient must go up to the next dose.

## 2023-09-14 ENCOUNTER — Other Ambulatory Visit: Payer: Self-pay | Admitting: *Deleted

## 2023-09-14 DIAGNOSIS — I10 Essential (primary) hypertension: Secondary | ICD-10-CM

## 2023-09-15 MED ORDER — LISINOPRIL 20 MG PO TABS
20.0000 mg | ORAL_TABLET | Freq: Every day | ORAL | 0 refills | Status: DC
Start: 2023-09-15 — End: 2023-12-12

## 2023-09-21 ENCOUNTER — Ambulatory Visit (INDEPENDENT_AMBULATORY_CARE_PROVIDER_SITE_OTHER): Payer: BC Managed Care – PPO

## 2023-09-21 DIAGNOSIS — Z23 Encounter for immunization: Secondary | ICD-10-CM

## 2023-09-26 ENCOUNTER — Telehealth: Payer: Self-pay | Admitting: Nurse Practitioner

## 2023-09-26 MED ORDER — ZEPBOUND 5 MG/0.5ML ~~LOC~~ SOAJ: 5.0000 mg | SUBCUTANEOUS | 2 refills | Status: DC

## 2023-09-26 NOTE — Addendum Note (Signed)
Addended by: Jannifer Rodney A on: 09/26/2023 02:29 PM   Modules accepted: Orders

## 2023-09-26 NOTE — Telephone Encounter (Signed)
Zepbound Prescription sent to pharmacy

## 2023-09-26 NOTE — Telephone Encounter (Signed)
Pt needs 5.0 [facility] [place]

## 2023-10-04 ENCOUNTER — Ambulatory Visit (INDEPENDENT_AMBULATORY_CARE_PROVIDER_SITE_OTHER): Payer: BC Managed Care – PPO | Admitting: Nurse Practitioner

## 2023-10-04 ENCOUNTER — Encounter: Payer: Self-pay | Admitting: Nurse Practitioner

## 2023-10-04 VITALS — BP 121/80 | HR 59 | Temp 97.3°F | Resp 20 | Ht 65.0 in | Wt 266.0 lb

## 2023-10-04 DIAGNOSIS — N6321 Unspecified lump in the left breast, upper outer quadrant: Secondary | ICD-10-CM | POA: Diagnosis not present

## 2023-10-04 DIAGNOSIS — M797 Fibromyalgia: Secondary | ICD-10-CM

## 2023-10-04 MED ORDER — PREDNISONE 10 MG (21) PO TBPK
ORAL_TABLET | ORAL | 0 refills | Status: DC
Start: 2023-10-04 — End: 2023-10-21

## 2023-10-04 NOTE — Progress Notes (Signed)
Subjective:    Patient ID: Rachael Allen, female    DOB: Apr 14, 1971, 52 y.o.   MRN: [id number]   Chief Complaint: Sore spot in left breast (/) and bilateral leg pain   HPI Patient comes in today with 2 complaints: - left breast sore- noticed about 4-5 days ago- no nipple discharge, no skin changes. - bil leg pain- throbbing pains in bil legs. Is intermittent. Feels sore. She has history of fibromyalgia and she thinks she is having a flare up. Patient Active Problem List   Diagnosis Date Noted   Pain in left foot 02/11/2023   Lumbar radiculopathy 10/18/2022   Low back pain 09/02/2022   Mass of joint of right hip 07/28/2022   Primary hypertension 08/10/2021   Cervical stenosis of spine 07/03/2020   Diverticulosis 06/29/2017   Morbid obesity (HCC) 03/24/2016   OSA on CPAP 03/24/2016   Benign paroxysmal positional vertigo due to bilateral vestibular disorder 02/10/2016   Bipolar disorder (HCC) 01/26/2016   Depression 05/09/2014   Narcolepsy 05/09/2014   IUD (intrauterine device) in place 04/04/2014   Migraines 04/19/2013   Fibromyalgia muscle pain 03/07/2013   PCOS (polycystic ovarian syndrome) 03/07/2013   Fibrocystic breast 01/08/2013       Review of Systems  Constitutional:  Negative for diaphoresis.  Eyes:  Negative for pain.  Respiratory:  Negative for shortness of breath.   Cardiovascular:  Negative for chest pain, palpitations and leg swelling.  Gastrointestinal:  Negative for abdominal pain.  Endocrine: Negative for polydipsia.  Skin:  Negative for rash.  Neurological:  Negative for dizziness, weakness and headaches.  Hematological:  Does not bruise/bleed easily.  All other systems reviewed and are negative.      Objective:   Physical Exam Constitutional:      Appearance: Normal appearance. She is obese.  Cardiovascular:     Rate and Rhythm: Normal rate and regular rhythm.     Heart sounds: Normal heart sounds.  Pulmonary:     Effort: Pulmonary effort  is normal.     Breath sounds: Normal breath sounds.  Chest:  Breasts:    Right: Normal.     Left: Mass (pea size  tender nodule LUOQ) present. No swelling, bleeding or inverted nipple.    Musculoskeletal:        General: Normal range of motion.     Comments: Currently not having leg pain  Skin:    General: Skin is warm.  Neurological:     General: No focal deficit present.     Mental Status: She is alert and oriented to person, place, and time.  Psychiatric:        Mood and Affect: Mood normal.        Behavior: Behavior normal.     BP 121/80   Pulse (!) 59   Temp (!) 97.3 F (36.3 C) (Temporal)   Resp 20   Ht 5\' 5"  (1.651 m)   Wt 266 lb (120.7 kg)   SpO2 98%   BMI 44.26 kg/m        Assessment & Plan:  Rachael Allen in today with chief complaint of Sore spot in left breast (/) and bilateral leg pain   1. Fibromyalgia muscle pain Rest Moist heat - predniSONE (STERAPRED UNI-PAK 21 TAB) 10 MG (21) TBPK tablet; As directed x 6 days  Dispense: 21 tablet; Refill: 0  2. Mass of upper outer quadrant of left breast Will talk after test results are back - MM 3D DIAGNOSTIC  MAMMOGRAM UNILATERAL LEFT BREAST    The above assessment and management plan was discussed with the patient. The patient verbalized understanding of and has agreed to the management plan. Patient is aware to call the clinic if symptoms persist or worsen. Patient is aware when to return to the clinic for a follow-up visit. Patient educated on when it is appropriate to go to the emergency department.   Mary-Margaret Daphine Deutscher, FNP

## 2023-10-04 NOTE — Patient Instructions (Signed)
Muscle Pain, Adult Muscle pain, also called myalgia, is a condition in which a person has pain in one or more muscles in the body. The pain may be mild, moderate, or severe. It may feel sharp, achy, or burning. In most cases, the pain lasts only a short time and goes away on its own. It is normal to feel some muscle pain after you start a new exercise program. Muscles that have not been used a lot will be sore at first. What are the causes? You may have muscle pain when you use your muscles in a new or different way after not having used them for some time. Muscle pain can also be caused by overuse or by stretching a muscle beyond its normal length (muscle strain). You may be more likely to have muscle pain if you are not in shape. Other causes may include: Injury or bruising. Infectious diseases. These include diseases caused by viruses, such as the flu (influenza). Fibromyalgia. This is a long-term (chronic) condition that causes muscle tenderness, tiredness (fatigue), and headache. Autoimmune or rheumatologic diseases. These are conditions, such as lupus, that cause the body's defense system (immune system) to attack areas in the body. Certain medicines. These include ACE inhibitors and statins. What are the signs or symptoms? The main symptom is sore or painful muscles. Your muscles may be sore when you do activities and when you stretch. You may also have slight swelling. How is this diagnosed? Muscle pain is diagnosed with a physical exam. Your health care provider will ask questions about your pain and when it began. If you have not had muscle pain for very long, your provider may want to wait before doing much testing. If your muscle pain has lasted a long time, tests may be done right away. In some cases, you may need tests to rule out other conditions and diseases. How is this treated? Treatment for muscle pain depends on the cause. Home care is usually enough to relieve the pain. Your  provider may also prescribe NSAIDs, such as ibuprofen. Follow these instructions at home: Medicines Take over-the-counter and prescription medicines only as told by your provider. Ask your health care provider if the medicine prescribed to you requires you to avoid driving or using machinery. Managing pain, swelling, and discomfort     If told, put ice on the painful area for the first 2 days of soreness. Put ice in a plastic bag. Place a towel between your skin and the bag. Leave the ice on for 20 minutes, 2-3 times a day. If your skin turns bright red, remove the ice right away to prevent skin damage. The risk of damage is higher if you cannot feel pain, heat, or cold. For the first 2 days of muscle soreness, or if there is swelling: Do not soak in hot baths. Do not use a hot tub, steam room, sauna, heating pad, or other heat source. After 2-3 days, you may switch between putting ice and heat on the area. If told, apply heat to the affected area as often as told by your provider. Use the heat source that your recommends, such as a moist heat pack or a heating pad. Place a towel between your skin and the heat source. Leave the heat on for 20-30 minutes. If your skin turns bright red, remove the ice or heat right away to prevent skin damage. The risk of damage is higher if you cannot feel pain, heat, or cold. If you have an injury,   raise (elevate) the injured area above the level of your heart while you are sitting or lying down. Activity  If your muscle pain is caused by overuse: Slow down your activities until the pain goes away. Do regular, gentle exercises if you are not normally active. Warm up before you exercise. Stretch before and after you exercise. This can help lower the risk of muscle pain. Do not keep working out if the pain is severe. Severe pain could mean that you have injured a muscle. You may have to avoid lifting. Ask your provider how much you can safely lift. Return  to your normal activities as told by your provider. Ask your provider what activities are safe for you. General instructions Do not use any products that contain nicotine or tobacco. These products include cigarettes, chewing tobacco, and vaping devices, such as e-cigarettes. If you need help quitting, ask your provider. Contact a health care provider if: Your muscle pain gets worse, and medicines do not help. The muscle pain lasts longer than 3 days. You have a rash or fever. You have muscle pain after a tick bite. You have muscle pain when you work out, even though you are in good shape. You have redness, soreness, or swelling. You have muscle pain after you start a new medicine or change the dose of a medicine. Get help right away if: You have trouble breathing. You have trouble swallowing. You have muscle pain along with a stiff neck, fever, and vomiting. You have severe muscle weakness, or you cannot move part of your body. You are urinating less, or you have dark, bloody, or discolored urine. You have redness or swelling at the site of the muscle pain. These symptoms may be an emergency. Get help right away. Call 911. Do not wait to see if the symptoms will go away. Do not drive yourself to the hospital. This information is not intended to replace advice given to you by your health care provider. Make sure you discuss any questions you have with your health care provider. Document Revised: 07/09/2022 Document Reviewed: 07/09/2022 Elsevier Patient Education  2024 Elsevier Inc.  

## 2023-10-05 ENCOUNTER — Other Ambulatory Visit: Payer: Self-pay | Admitting: Nurse Practitioner

## 2023-10-05 ENCOUNTER — Encounter: Payer: Self-pay | Admitting: Nurse Practitioner

## 2023-10-05 DIAGNOSIS — N6321 Unspecified lump in the left breast, upper outer quadrant: Secondary | ICD-10-CM

## 2023-10-21 ENCOUNTER — Ambulatory Visit (INDEPENDENT_AMBULATORY_CARE_PROVIDER_SITE_OTHER): Payer: BC Managed Care – PPO | Admitting: Family Medicine

## 2023-10-21 ENCOUNTER — Encounter: Payer: Self-pay | Admitting: Family Medicine

## 2023-10-21 VITALS — BP 104/66 | HR 74 | Temp 97.5°F | Ht 65.0 in | Wt 268.0 lb

## 2023-10-21 DIAGNOSIS — J4521 Mild intermittent asthma with (acute) exacerbation: Secondary | ICD-10-CM

## 2023-10-21 DIAGNOSIS — J069 Acute upper respiratory infection, unspecified: Secondary | ICD-10-CM | POA: Diagnosis not present

## 2023-10-21 MED ORDER — PREDNISONE 20 MG PO TABS
40.0000 mg | ORAL_TABLET | Freq: Every day | ORAL | 0 refills | Status: AC
Start: 2023-10-21 — End: 2023-10-26

## 2023-10-21 MED ORDER — ALBUTEROL SULFATE HFA 108 (90 BASE) MCG/ACT IN AERS
2.0000 | INHALATION_SPRAY | Freq: Four times a day (QID) | RESPIRATORY_TRACT | 2 refills | Status: DC | PRN
Start: 2023-10-21 — End: 2023-10-31

## 2023-10-21 NOTE — Progress Notes (Signed)
Acute Office Visit  Subjective:     Patient ID: Rachael Allen, female    DOB: 10-Jul-1971, 52 y.o.   MRN: [id number]  Chief Complaint  Patient presents with   Sore Throat   Cough   Nasal Congestion   Headache    Cough This is a new problem. Episode onset: 3 days. The problem has been gradually worsening. The problem occurs every few minutes. The cough is Non-productive. Associated symptoms include chills, ear congestion, headaches, myalgias, nasal congestion, rhinorrhea, a sore throat, shortness of breath (mild with cough) and wheezing (last night). Pertinent negatives include no chest pain, ear pain, fever or postnasal drip. Nothing aggravates the symptoms. Treatments tried: nasal spray, dayquil, advil. The treatment provided mild relief. Her past medical history is significant for asthma and pneumonia. There is no history of bronchitis or COPD.     Review of Systems  Constitutional:  Positive for chills. Negative for fever.  HENT:  Positive for rhinorrhea and sore throat. Negative for ear pain and postnasal drip.   Respiratory:  Positive for cough, shortness of breath (mild with cough) and wheezing (last night).   Cardiovascular:  Negative for chest pain.  Musculoskeletal:  Positive for myalgias.  Neurological:  Positive for headaches.        Objective:    BP 104/66   Pulse 74   Temp (!) 97.5 F (36.4 C) (Temporal)   Ht 5\' 5"  (1.651 m)   Wt 268 lb (121.6 kg)   SpO2 98%   BMI 44.60 kg/m    Physical Exam Vitals and nursing note reviewed.  Constitutional:      General: She is not in acute distress.    Appearance: She is obese. She is not ill-appearing, toxic-appearing or diaphoretic.  HENT:     Head: Normocephalic and atraumatic.     Right Ear: Tympanic membrane, ear canal and external ear normal.     Left Ear: Tympanic membrane, ear canal and external ear normal.     Nose: Congestion present.     Mouth/Throat:     Mouth: Mucous membranes are moist.      Pharynx: Oropharynx is clear. Uvula midline. Posterior oropharyngeal erythema present. No pharyngeal swelling, oropharyngeal exudate, uvula swelling or postnasal drip.     Tonsils: No tonsillar exudate or tonsillar abscesses. 1+ on the right. 1+ on the left.  Eyes:     General:        Right eye: No discharge.        Left eye: No discharge.  Cardiovascular:     Rate and Rhythm: Normal rate and regular rhythm.     Heart sounds: Normal heart sounds. No murmur heard. Pulmonary:     Effort: Pulmonary effort is normal. No respiratory distress.     Breath sounds: Normal breath sounds. No wheezing, rhonchi or rales.  Musculoskeletal:     Cervical back: Neck supple. No rigidity.     Right lower leg: No edema.     Left lower leg: No edema.  Lymphadenopathy:     Cervical: No cervical adenopathy.  Skin:    General: Skin is warm and dry.  Neurological:     General: No focal deficit present.     Mental Status: She is alert and oriented to person, place, and time.  Psychiatric:        Mood and Affect: Mood normal.        Behavior: Behavior normal.     No results found for any visits on  10/21/23.      Assessment & Plan:   Rachael Allen was seen today for sore throat, cough, nasal congestion and headache.  Diagnoses and all orders for this visit:  Viral URI with cough Covid, flu, RSV pending. Quarantine pending results. Discussed symptomatic care and return precautions.  -     COVID-19, Flu A+B and RSV  Mild intermittent asthma with (acute) exacerbation Triggered by viral URI. Prednisone burst as below. Albuterol prn.  -     predniSONE (DELTASONE) 20 MG tablet; Take 2 tablets (40 mg total) by mouth daily with breakfast for 5 days. -     albuterol (VENTOLIN HFA) 108 (90 Base) MCG/ACT inhaler; Inhale 2 puffs into the lungs every 6 (six) hours as needed for wheezing or shortness of breath.   Return if symptoms worsen or fail to improve.  The patient indicates understanding of these issues and  agrees with the plan.  Gabriel Earing, FNP

## 2023-10-22 LAB — COVID-19, FLU A+B AND RSV
Influenza A, NAA: NOT DETECTED
Influenza B, NAA: NOT DETECTED
RSV, NAA: NOT DETECTED
SARS-CoV-2, NAA: NOT DETECTED

## 2023-10-28 ENCOUNTER — Ambulatory Visit
Admission: RE | Admit: 2023-10-28 | Discharge: 2023-10-28 | Disposition: A | Discharge status: Home / Self Care | Payer: BC Managed Care – PPO | Source: Ambulatory Visit | Attending: Nurse Practitioner | Admitting: Nurse Practitioner

## 2023-10-28 ENCOUNTER — Other Ambulatory Visit: Payer: Self-pay | Admitting: Nurse Practitioner

## 2023-10-28 DIAGNOSIS — N6321 Unspecified lump in the left breast, upper outer quadrant: Secondary | ICD-10-CM

## 2023-10-28 DIAGNOSIS — R590 Localized enlarged lymph nodes: Secondary | ICD-10-CM | POA: Diagnosis not present

## 2023-10-28 DIAGNOSIS — N644 Mastodynia: Secondary | ICD-10-CM | POA: Diagnosis not present

## 2023-10-28 DIAGNOSIS — N6332 Unspecified lump in axillary tail of the left breast: Secondary | ICD-10-CM | POA: Diagnosis not present

## 2023-10-31 ENCOUNTER — Other Ambulatory Visit: Payer: Self-pay | Admitting: *Deleted

## 2023-10-31 DIAGNOSIS — J4521 Mild intermittent asthma with (acute) exacerbation: Secondary | ICD-10-CM

## 2023-10-31 MED ORDER — ALBUTEROL SULFATE HFA 108 (90 BASE) MCG/ACT IN AERS: 2.0000 | INHALATION_SPRAY | Freq: Four times a day (QID) | RESPIRATORY_TRACT | 0 refills | Status: DC | PRN

## 2023-11-17 NOTE — Telephone Encounter (Signed)
Copied from CRM [id number]. Topic: Clinical - Medication Refill >> Nov 17, 2023  9:44 AM Fonda Kinder J wrote: Most Recent Primary Care Visit:  Provider: Gabriel Earing  Department: [facility]  Visit Type: ACUTE  Date: 10/21/2023  Medication: tirzepatide (ZEPBOUND) 5 MG/0.5ML   Has the patient contacted their pharmacy? Yes (Agent: If no, request that the patient contact the pharmacy for the refill. If patient does not wish to contact the pharmacy document the reason why and proceed with request.) (Agent: If yes, when and what did the pharmacy advise?) Contact PCP  Is this the correct pharmacy for this prescription? Yes If no, delete pharmacy and type the correct one.  This is the patient's preferred pharmacy:  [id number] - [place],  - [address] [place] [address] Phone: [phone] Fax: [phone]    Has the prescription been filled recently? No  Is the patient out of the medication? Yes  Has the patient been seen for an appointment in the last year OR does the patient have an upcoming appointment? Yes  Can we respond through MyChart? No  Agent: Please be advised that Rx refills may take up to 3 business days. We ask that you follow-up with your pharmacy.

## 2023-11-22 ENCOUNTER — Telehealth: Payer: Self-pay | Admitting: Family Medicine

## 2023-11-22 MED ORDER — ZEPBOUND 7.5 MG/0.5ML ~~LOC~~ SOAJ: 7.5000 mg | SUBCUTANEOUS | 1 refills | Status: DC

## 2023-11-22 NOTE — Telephone Encounter (Signed)
Copied from CRM [id number]. Topic: Clinical - Medication Refill >> Nov 22, 2023 11:30 AM Almira Coaster wrote: Most Recent Primary Care Visit:  Provider: Gabriel Earing  Department: [facility]  Visit Type: ACUTE  Date: 10/21/2023  Medication: tirzepatide (ZEPBOUND) 7.5 MG/0.5ML Pen   Has the patient contacted their pharmacy? No, the MG is not working and would like an increase to the 7.5 mg (Agent: If no, request that the patient contact the pharmacy for the refill. If patient does not wish to contact the pharmacy document the reason why and proceed with request.) (Agent: If yes, when and what did the pharmacy advise?)  Is this the correct pharmacy for this prescription? Yes If no, delete pharmacy and type the correct one.  This is the patient's preferred pharmacy:  [id number] - [place], [place] - [address] [place] [address] Phone: [phone] Fax: [phone]   Has the prescription been filled recently? No  Is the patient out of the medication? Yes  Has the patient been seen for an appointment in the last year OR does the patient have an upcoming appointment? Yes  Can we respond through MyChart? No, phone call  Agent: Please be advised that Rx refills may take up to 3 business days. We ask that you follow-up with your pharmacy.

## 2023-11-25 ENCOUNTER — Other Ambulatory Visit (HOSPITAL_COMMUNITY): Payer: Self-pay

## 2023-11-25 ENCOUNTER — Telehealth: Payer: Self-pay | Admitting: Family Medicine

## 2023-11-25 ENCOUNTER — Other Ambulatory Visit: Payer: Self-pay | Admitting: *Deleted

## 2023-11-25 ENCOUNTER — Telehealth: Payer: Self-pay

## 2023-11-25 DIAGNOSIS — E282 Polycystic ovarian syndrome: Secondary | ICD-10-CM

## 2023-11-25 MED ORDER — ZEPBOUND 10 MG/0.5ML ~~LOC~~ SOAJ: 10.0000 mg | SUBCUTANEOUS | 2 refills | Status: DC

## 2023-11-25 NOTE — Telephone Encounter (Signed)
MMM NTBS for 6 mos FU NO RF sent to mail order pharmacy

## 2023-11-25 NOTE — Telephone Encounter (Signed)
Pharmacy Patient Advocate Encounter   Received notification from Pt Calls Messages that prior authorization for ZEPBOUND 7.5MG  is required/requested.   Insurance verification completed.   The patient is insured through [employer] .   Per test claim: Patient needs to titrate up to the next dose. Ran test claim for Zepbound 10mg , The current 28 day co-pay is, $0.  No PA needed at this time. This test claim was processed through [facility]- copay amounts may vary at other pharmacies due to pharmacy/plan contracts, or as the patient moves through the different stages of their insurance plan.

## 2023-11-25 NOTE — Telephone Encounter (Signed)
Pt said that another dr is to refill this rx.

## 2023-11-25 NOTE — Telephone Encounter (Signed)
Copied from CRM [id number]. Topic: Clinical - Prescription Issue >> Nov 24, 2023  4:51 PM Clayton Bibles wrote: Reason for CRM: tirzepatide (ZEPBOUND) 7.5 MG/0.5ML Pen  - Needs a prior authorization - call [phone]

## 2023-11-25 NOTE — Telephone Encounter (Signed)
PA request has been  initiated . New Encounter created for follow up. For additional info see Pharmacy Prior Auth telephone encounter from 11/25/23.

## 2023-11-25 NOTE — Addendum Note (Signed)
Addended by: Bennie Pierini on: 11/25/2023 01:49 PM   Modules accepted: Orders

## 2023-11-28 ENCOUNTER — Telehealth: Payer: Self-pay | Admitting: Family Medicine

## 2023-11-28 ENCOUNTER — Other Ambulatory Visit: Payer: Self-pay | Admitting: Nurse Practitioner

## 2023-11-28 DIAGNOSIS — Z1231 Encounter for screening mammogram for malignant neoplasm of breast: Secondary | ICD-10-CM

## 2023-11-28 DIAGNOSIS — N6321 Unspecified lump in the left breast, upper outer quadrant: Secondary | ICD-10-CM

## 2023-11-28 NOTE — Telephone Encounter (Signed)
Copied from CRM [id number]. Topic: Clinical - Medication Question >> Nov 28, 2023  3:49 PM Dennison Nancy wrote: Reason for CRM: patient need to know which medication to take  rather skip 7.5 or take the 10.0  ZEPBOUND  patient insurance will pay for the 7.5 can call in something if want the 10.0

## 2023-11-29 ENCOUNTER — Telehealth: Payer: Self-pay | Admitting: Family Medicine

## 2023-11-29 ENCOUNTER — Other Ambulatory Visit: Payer: Self-pay | Admitting: *Deleted

## 2023-11-29 DIAGNOSIS — E282 Polycystic ovarian syndrome: Secondary | ICD-10-CM

## 2023-11-29 MED ORDER — TIRZEPATIDE 7.5 MG/0.5ML ~~LOC~~ SOAJ: 7.5000 mg | SUBCUTANEOUS | 1 refills | Status: DC

## 2023-11-29 NOTE — Telephone Encounter (Signed)
Needs to not skip doses- sent in 7.5mg  weekly

## 2023-11-29 NOTE — Telephone Encounter (Unsigned)
Copied from CRM [id number]. Topic: Clinical - Medication Question >> Nov 29, 2023  2:00 PM Alcus Dad H wrote: Reason for CRM: Pt called back in regards to CRM from yesterday about her zepbound, "patient need to know which medication to take  rather skip 7.5 or take the 10.0  ZEPBOUND, patient insurance will pay for the 7.5 can call in something if want the 10.0" Give pt a call back

## 2023-11-29 NOTE — Telephone Encounter (Signed)
Duplicate message. Please see other telephone encounter.

## 2023-11-29 NOTE — Telephone Encounter (Signed)
MMM NTBS for 6 mos FU NO RF sent to pharmacy

## 2023-11-29 NOTE — Telephone Encounter (Signed)
Called and spoke with patient. Her insurance is requiring a PA for the 7.5 but will cover the 10mg . Do you want to attempt the PA or move on to the 10mg ?

## 2023-11-29 NOTE — Addendum Note (Signed)
Addended by: Bennie Pierini on: 11/29/2023 04:52 PM   Modules accepted: Orders

## 2023-11-30 ENCOUNTER — Telehealth: Payer: Self-pay | Admitting: Pharmacist

## 2023-11-30 ENCOUNTER — Other Ambulatory Visit (HOSPITAL_COMMUNITY): Payer: Self-pay

## 2023-11-30 ENCOUNTER — Encounter: Payer: Self-pay | Admitting: Nurse Practitioner

## 2023-11-30 NOTE — Telephone Encounter (Signed)
 Care team updated and letter sent for eye exam notes.

## 2023-11-30 NOTE — Telephone Encounter (Signed)
We received a prior authorization request for Zepbound 7.5mg . The insurance only allows for a month supply per rolling 365 days and the patient should titrate up to the next dose each month.   Per test claim, the Zepbound 10mg  has been filled and came back as a refill too soon. For the appeal to be made, you would have to provide the reason for wanting to keep the patient on the 7.5mg  longer than they cover.

## 2023-11-30 NOTE — Telephone Encounter (Signed)
Please appeal or forward me appeal info Last OV for obesity is 08/23/23 Patient is participating in weight watchers per chart notes Not sure why other PA was denied Latest BMI 44.9 It appears she has lost weight ~20lbs Also, if additional info is needed I can forward to PCP Thanks!

## 2023-12-01 NOTE — Telephone Encounter (Signed)
Can you help?

## 2023-12-05 NOTE — Telephone Encounter (Signed)
Additional information has been requested from the patient's insurance in order to proceed with the prior authorization request. Requested information has been sent, or form has been filled out and faxed back to [phone]

## 2023-12-09 NOTE — Telephone Encounter (Signed)
Patient notified that appeal for PA has been approved and to contact her pharmacy to fill. Patient verbalized understanding

## 2023-12-09 NOTE — Telephone Encounter (Signed)
Pharmacy Patient Advocate Encounter  Received notification from [employer] that APPEAL for ZEPBOUND 7.5MG  has been APPROVED from 11/08/23 to 01/08/24   PA #/Case ID/Reference #: [id number]

## 2023-12-12 ENCOUNTER — Other Ambulatory Visit: Payer: Self-pay | Admitting: Nurse Practitioner

## 2023-12-12 DIAGNOSIS — I10 Essential (primary) hypertension: Secondary | ICD-10-CM

## 2023-12-12 DIAGNOSIS — E282 Polycystic ovarian syndrome: Secondary | ICD-10-CM

## 2023-12-12 MED ORDER — METFORMIN HCL ER 500 MG PO TB24: 500.0000 mg | ORAL_TABLET | Freq: Two times a day (BID) | ORAL | 0 refills | Status: DC

## 2023-12-12 NOTE — Addendum Note (Signed)
Addended by: Julious Payer D on: 12/12/2023 01:47 PM   Modules accepted: Orders

## 2023-12-12 NOTE — Telephone Encounter (Signed)
I called pt & made her an appt w/MMM on 12-26-2023 at 3:30pm to get meds refilled.

## 2023-12-12 NOTE — Addendum Note (Signed)
Addended by: Julious Payer D on: 12/12/2023 03:13 PM   Modules accepted: Orders

## 2023-12-12 NOTE — Telephone Encounter (Addendum)
MMM NTBS last Diabetic FU in March NO RF sent to mail order pharmacy

## 2023-12-21 ENCOUNTER — Telehealth: Payer: Self-pay

## 2023-12-21 ENCOUNTER — Other Ambulatory Visit (HOSPITAL_COMMUNITY): Payer: Self-pay

## 2023-12-21 NOTE — Telephone Encounter (Signed)
 Pharmacy Patient Advocate Encounter   Received notification from [employer] that prior authorization for Mounjaro  7.5MG /0.5ML auto-injectors is required/requested.   Insurance verification completed.   The patient is insured through [employer] .   Per test claim: PA required; PA submitted to above mentioned insurance via CoverMyMeds Key/confirmation #/EOC [facility] Status is pending

## 2023-12-23 MED ORDER — ZEPBOUND 7.5 MG/0.5ML ~~LOC~~ SOAJ: 7.5000 mg | SUBCUTANEOUS | 1 refills | Status: DC

## 2023-12-23 NOTE — Telephone Encounter (Signed)
 Pharmacy Patient Advocate Encounter  Received notification from [employer] that Prior Authorization for Mounjaro  7.5mg  pens has been DENIED.  See denial reason below. No denial letter attached in CMM. Will attach denial letter to Media tab once received.   PA #/Case ID/Reference #: [id number]

## 2023-12-23 NOTE — Addendum Note (Signed)
 Addended by: Bennie Pierini on: 12/23/2023 01:12 PM   Modules accepted: Orders

## 2023-12-26 ENCOUNTER — Encounter: Payer: Self-pay | Admitting: Nurse Practitioner

## 2023-12-26 ENCOUNTER — Ambulatory Visit: Payer: BC Managed Care – PPO | Admitting: Nurse Practitioner

## 2023-12-26 VITALS — BP 125/81 | HR 73 | Temp 97.5°F | Ht 65.0 in | Wt 267.0 lb

## 2023-12-26 DIAGNOSIS — I10 Essential (primary) hypertension: Secondary | ICD-10-CM | POA: Diagnosis not present

## 2023-12-26 DIAGNOSIS — E282 Polycystic ovarian syndrome: Secondary | ICD-10-CM

## 2023-12-26 DIAGNOSIS — R7309 Other abnormal glucose: Secondary | ICD-10-CM | POA: Diagnosis not present

## 2023-12-26 DIAGNOSIS — F3160 Bipolar disorder, current episode mixed, unspecified: Secondary | ICD-10-CM

## 2023-12-26 DIAGNOSIS — F3342 Major depressive disorder, recurrent, in full remission: Secondary | ICD-10-CM | POA: Diagnosis not present

## 2023-12-26 DIAGNOSIS — K5790 Diverticulosis of intestine, part unspecified, without perforation or abscess without bleeding: Secondary | ICD-10-CM

## 2023-12-26 DIAGNOSIS — M4802 Spinal stenosis, cervical region: Secondary | ICD-10-CM | POA: Diagnosis not present

## 2023-12-26 DIAGNOSIS — M797 Fibromyalgia: Secondary | ICD-10-CM | POA: Diagnosis not present

## 2023-12-26 LAB — BAYER DCA HB A1C WAIVED: HB A1C (BAYER DCA - WAIVED): 5.1 % (ref 4.8–5.6)

## 2023-12-26 LAB — LIPID PANEL

## 2023-12-26 MED ORDER — LISINOPRIL 20 MG PO TABS: 20.0000 mg | ORAL_TABLET | Freq: Every day | ORAL | 0 refills | Status: DC

## 2023-12-26 MED ORDER — LISINOPRIL 20 MG PO TABS: 20.0000 mg | ORAL_TABLET | Freq: Every day | ORAL | 1 refills | Status: DC

## 2023-12-26 MED ORDER — METFORMIN HCL ER 500 MG PO TB24: 500.0000 mg | ORAL_TABLET | Freq: Two times a day (BID) | ORAL | 0 refills | Status: DC

## 2023-12-26 MED ORDER — METFORMIN HCL ER 500 MG PO TB24: 500.0000 mg | ORAL_TABLET | Freq: Two times a day (BID) | ORAL | 1 refills | Status: DC

## 2023-12-26 MED ORDER — LURASIDONE HCL 40 MG PO TABS: 40.0000 mg | ORAL_TABLET | Freq: Every day | ORAL | 1 refills | Status: DC

## 2023-12-26 MED ORDER — KETOROLAC TROMETHAMINE 60 MG/2ML IM SOLN
60.0000 mg | Freq: Once | INTRAMUSCULAR | Status: AC
  Administered 2023-12-26: 60 mg via INTRAMUSCULAR

## 2023-12-26 MED ORDER — METHYLPREDNISOLONE ACETATE 80 MG/ML IJ SUSP
80.0000 mg | Freq: Once | INTRAMUSCULAR | Status: AC
  Administered 2023-12-26: 80 mg via INTRAMUSCULAR

## 2023-12-26 NOTE — Progress Notes (Signed)
 Subjective:    Patient ID: Rachael Allen, female    DOB: [birth year], 53 y.o.   MRN: [id number]  Chief Complaint: medical management of chronic issues     HPI:  Rachael Allen is a 53 y.o. who identifies as a female who was assigned female at birth.   Social history: Lives with: husband Work history: disability   Comes in today for follow up of the following chronic medical issues:  1. Primary hypertension No c/o chest pain, sob or headache. Does not check blood pressure at home. BP Readings from Last 3 Encounters:  10/21/23 104/66  10/04/23 121/80  08/23/23 115/73     2. Intractable chronic migraine without aura and without status migrainosus Currently doing well. Has migraine 1x a quarter.  3. Diverticulosis Denies any recent flare ups  4. Recurrent major depressive disorder, in full remission (HCC) 5. Bipolar affective disorder, current episode mixed, current episode severity unspecified (HCC) Is on latuda  which is working well for her with no side effects.    10/21/2023    1:21 PM 10/04/2023   10:28 AM 08/23/2023    2:07 PM  Depression screen PHQ 2/9  Decreased Interest 1 1 1   Down, Depressed, Hopeless 0 0 1  PHQ - 2 Score 1 1 2   Altered sleeping 1 1 1   Tired, decreased energy 1 0 0  Change in appetite 0 0 0  Feeling bad or failure about yourself  0 0 1  Trouble concentrating 1 0 0  Moving slowly or fidgety/restless 0 0 0  Suicidal thoughts 0 0 0  PHQ-9 Score 4 2 4   Difficult doing work/chores Somewhat difficult Somewhat difficult Somewhat difficult      10/21/2023    1:22 PM 10/04/2023   10:28 AM 08/23/2023    2:08 PM 05/30/2023   11:28 AM  GAD 7 : Generalized Anxiety Score  Nervous, Anxious, on Edge 1 1 1 1   Control/stop worrying 1 1 1 1   Worry too much - different things 1 1 1 1   Trouble relaxing 0 0 1 0  Restless 1 1 1  0  Easily annoyed or irritable 0 0 0 0  Afraid - awful might happen 0 0 0 0  Total GAD 7 Score 4 4 5 3   Anxiety Difficulty  Somewhat difficult Somewhat difficult Somewhat difficult Somewhat difficult      6. Cervical stenosis of spine Has chronic back pain but is on no pain medication at this time  7. Fibromyalgia Has pain daily. Some days are better then others. Pain increases with to much activity. Rest helps. Rates pain 7/10 today.   8. Morbid obesity (HCC) No recent weight changes  Wt Readings from Last 3 Encounters:  12/26/23 267 lb (121.1 kg)  10/21/23 268 lb (121.6 kg)  10/04/23 266 lb (120.7 kg)   BMI Readings from Last 3 Encounters:  12/26/23 44.43 kg/m  10/21/23 44.60 kg/m  10/04/23 44.26 kg/m      New complaints: None today  Allergies  Allergen Reactions   Oxycodone -Acetaminophen  Itching    vomiting   Nitrofurantoin Other (See Comments)    SEVERE HEADACHE   Benadryl [Diphenhydramine Hcl]    Dilaudid [Hydromorphone Hcl]    Latex    Nsaids Nausea Only    In large doses   Vicodin [Hydrocodone-Acetaminophen ]    Outpatient Encounter Medications as of 12/26/2023  Medication Sig   albuterol  (VENTOLIN  HFA) 108 (90 Base) MCG/ACT inhaler Inhale 2 puffs into the lungs every 6 (six)  hours as needed for wheezing or shortness of breath.   [employer] B-12 500 MCG tablet Take 500 mcg by mouth daily.   hydrocortisone  1 % lotion Apply 1 Application topically 2 (two) times daily.   lisinopril  (ZESTRIL ) 20 MG tablet TAKE 1 TABLET DAILY   lurasidone  (LATUDA ) 40 MG TABS tablet Take 1 tablet (40 mg total) by mouth daily with breakfast. (Patient taking differently: Take 40 mg by mouth daily.)   metFORMIN  (GLUCOPHAGE -XR) 500 MG 24 hr tablet Take 1 tablet (500 mg total) by mouth 2 (two) times daily.   ondansetron  (ZOFRAN ) 4 MG tablet Take 1 tablet (4 mg total) by mouth every 8 (eight) hours as needed for nausea or vomiting.   tirzepatide  (MOUNJARO ) 7.5 MG/0.5ML Pen Inject 7.5 mg into the skin once a week.   tirzepatide  (ZEPBOUND ) 7.5 MG/0.5ML Pen Inject 7.5 mg into the skin once a week.    Tirzepatide -Weight Management 2.5 MG/0.5ML SOLN Inject 2.5 mg into the skin once a week.   valACYclovir  (VALTREX ) 1000 MG tablet TAKE 2 TABLETS TWICE A DAY BY MOUTH FOR 1 DAY THEN AS NEEDED FOR FEVER BLISTERS   No facility-administered encounter medications on file as of 12/26/2023.    Past Surgical History:  Procedure Laterality Date   CESAREAN SECTION     TONSILLECTOMY AND ADENOIDECTOMY      Family History  Problem Relation Age of Onset   Breast cancer Mother 53   Hypertension Mother    Mental illness Father    Hypertension Sister       Controlled substance contract: n/a     Review of Systems  Constitutional:  Negative for diaphoresis.  Eyes:  Negative for pain.  Respiratory:  Negative for shortness of breath.   Cardiovascular:  Negative for chest pain, palpitations and leg swelling.  Gastrointestinal:  Negative for abdominal pain.  Endocrine: Negative for polydipsia.  Skin:  Negative for rash.  Neurological:  Negative for dizziness, weakness and headaches.  Hematological:  Does not bruise/bleed easily.  All other systems reviewed and are negative.      Objective:   Physical Exam Vitals and nursing note reviewed.  Constitutional:      General: She is not in acute distress.    Appearance: Normal appearance. She is well-developed.  HENT:     Head: Normocephalic.     Right Ear: Tympanic membrane normal.     Left Ear: Tympanic membrane normal.     Nose: Nose normal.     Mouth/Throat:     Mouth: Mucous membranes are moist.  Eyes:     Pupils: Pupils are equal, round, and reactive to light.  Neck:     Vascular: No carotid bruit or JVD.  Cardiovascular:     Rate and Rhythm: Normal rate and regular rhythm.     Heart sounds: Normal heart sounds.  Pulmonary:     Effort: Pulmonary effort is normal. No respiratory distress.     Breath sounds: Normal breath sounds. No wheezing or rales.  Chest:     Chest wall: No tenderness.  Abdominal:     General: Bowel sounds  are normal. There is no distension or abdominal bruit.     Palpations: Abdomen is soft. There is no hepatomegaly, splenomegaly, mass or pulsatile mass.     Tenderness: There is no abdominal tenderness.  Musculoskeletal:        General: Normal range of motion.     Cervical back: Normal range of motion and neck supple.  Lymphadenopathy:  Cervical: No cervical adenopathy.  Skin:    General: Skin is warm and dry.  Neurological:     Mental Status: She is alert and oriented to person, place, and time.     Deep Tendon Reflexes: Reflexes are normal and symmetric.  Psychiatric:        Behavior: Behavior normal.        Thought Content: Thought content normal.        Judgment: Judgment normal.    BP 125/81   Pulse 73   Temp (!) 97.5 F (36.4 C) (Temporal)   Ht 5' 5 (1.651 m)   Wt 267 lb (121.1 kg)   SpO2 97%   BMI 44.43 kg/m    Hgba1c 5.1%      Assessment & Plan:   Rachael Allen comes in today with chief complaint of No chief complaint on file.   Diagnosis and orders addressed:  1. Primary hypertension Low sodium diet - CBC with Differential/Platelet - CMP14+EGFR - lisinopril  (ZESTRIL ) 20 MG tablet; Take 1 tablet (20 mg total) by mouth daily.  Dispense: 90 tablet; Refill: 1 - CBC with Differential/Platelet - CMP14+EGFR - Lipid panel  2. Intractable chronic migraine without aura and without status migrainosus Avoid caffeine  3. Diverticulosis Watch diet to prevent flare up  4. Recurrent major depressive disorder, in full remission (HCC) Stress management  5. Bipolar affective disorder, current episode mixed, current episode severity unspecified (HCC) Continue with psych  6. Cervical stenosis of spine  7. Morbid obesity (HCC) Discussed diet and exercise for person with BMI >25 Will recheck weight in 3-6 months Still waiting on wegovy  prescription which has been on back order  8. Fibromyalgia muscle pain Moist heat Depomedrol 80mg  IM Toradol  60mg  IM -  Bayer DCA Hb A1c Waived - Lipid panel  9. PCOS (polycystic ovarian syndrome) - metFORMIN  (GLUCOPHAGE -XR) 500 MG 24 hr tablet; Take 1 tablet (500 mg total) by mouth 2 (two) times daily.  Dispense: 180 tablet; Refill: 1   Labs pending Health Maintenance reviewed Diet and exercise encouraged  Follow up plan: 6 months   Mary-Margaret Gladis, FNP

## 2023-12-26 NOTE — Patient Instructions (Signed)
 Muscle Pain, Adult Muscle pain, also called myalgia, is a condition in which a person has pain in one or more muscles in the body. The pain may be mild, moderate, or severe. It may feel sharp, achy, or burning. In most cases, the pain lasts only a short time and goes away on its own. It is normal to feel some muscle pain after you start a new exercise program. Muscles that have not been used a lot will be sore at first. What are the causes? You may have muscle pain when you use your muscles in a new or different way after not having used them for some time. Muscle pain can also be caused by overuse or by stretching a muscle beyond its normal length (muscle strain). You may be more likely to have muscle pain if you are not in shape. Other causes may include: Injury or bruising. Infectious diseases. These include diseases caused by viruses, such as the flu (influenza). Fibromyalgia. This is a long-term (chronic) condition that causes muscle tenderness, tiredness (fatigue), and headache. Autoimmune or rheumatologic diseases. These are conditions, such as lupus, that cause the body's defense system (immune system) to attack areas in the body. Certain medicines. These include ACE inhibitors and statins. What are the signs or symptoms? The main symptom is sore or painful muscles. Your muscles may be sore when you do activities and when you stretch. You may also have slight swelling. How is this diagnosed? Muscle pain is diagnosed with a physical exam. Your health care provider will ask questions about your pain and when it began. If you have not had muscle pain for very long, your provider may want to wait before doing much testing. If your muscle pain has lasted a long time, tests may be done right away. In some cases, you may need tests to rule out other conditions and diseases. How is this treated? Treatment for muscle pain depends on the cause. Home care is usually enough to relieve the pain. Your  provider may also prescribe NSAIDs, such as ibuprofen. Follow these instructions at home: Medicines Take over-the-counter and prescription medicines only as told by your provider. Ask your health care provider if the medicine prescribed to you requires you to avoid driving or using machinery. Managing pain, swelling, and discomfort     If told, put ice on the painful area for the first 2 days of soreness. Put ice in a plastic bag. Place a towel between your skin and the bag. Leave the ice on for 20 minutes, 2-3 times a day. If your skin turns bright red, remove the ice right away to prevent skin damage. The risk of damage is higher if you cannot feel pain, heat, or cold. For the first 2 days of muscle soreness, or if there is swelling: Do not soak in hot baths. Do not use a hot tub, steam room, sauna, heating pad, or other heat source. After 2-3 days, you may switch between putting ice and heat on the area. If told, apply heat to the affected area as often as told by your provider. Use the heat source that your recommends, such as a moist heat pack or a heating pad. Place a towel between your skin and the heat source. Leave the heat on for 20-30 minutes. If your skin turns bright red, remove the ice or heat right away to prevent skin damage. The risk of damage is higher if you cannot feel pain, heat, or cold. If you have an injury,  raise (elevate) the injured area above the level of your heart while you are sitting or lying down. Activity  If your muscle pain is caused by overuse: Slow down your activities until the pain goes away. Do regular, gentle exercises if you are not normally active. Warm up before you exercise. Stretch before and after you exercise. This can help lower the risk of muscle pain. Do not keep working out if the pain is severe. Severe pain could mean that you have injured a muscle. You may have to avoid lifting. Ask your provider how much you can safely lift. Return  to your normal activities as told by your provider. Ask your provider what activities are safe for you. General instructions Do not use any products that contain nicotine or tobacco. These products include cigarettes, chewing tobacco, and vaping devices, such as e-cigarettes. If you need help quitting, ask your provider. Contact a health care provider if: Your muscle pain gets worse, and medicines do not help. The muscle pain lasts longer than 3 days. You have a rash or fever. You have muscle pain after a tick bite. You have muscle pain when you work out, even though you are in good shape. You have redness, soreness, or swelling. You have muscle pain after you start a new medicine or change the dose of a medicine. Get help right away if: You have trouble breathing. You have trouble swallowing. You have muscle pain along with a stiff neck, fever, and vomiting. You have severe muscle weakness, or you cannot move part of your body. You are urinating less, or you have dark, bloody, or discolored urine. You have redness or swelling at the site of the muscle pain. These symptoms may be an emergency. Get help right away. Call 911. Do not wait to see if the symptoms will go away. Do not drive yourself to the hospital. This information is not intended to replace advice given to you by your health care provider. Make sure you discuss any questions you have with your health care provider. Document Revised: 07/09/2022 Document Reviewed: 07/09/2022 Elsevier Patient Education  2024 [employer].

## 2023-12-27 ENCOUNTER — Other Ambulatory Visit (HOSPITAL_COMMUNITY): Payer: Self-pay

## 2023-12-27 LAB — CBC WITH DIFFERENTIAL/PLATELET
Basophils Absolute: 0.1 10*3/uL (ref 0.0–0.2)
Basos: 1 %
EOS (ABSOLUTE): 0.2 10*3/uL (ref 0.0–0.4)
Eos: 3 %
Hematocrit: 39 % (ref 34.0–46.6)
Hemoglobin: 13.3 g/dL (ref 11.1–15.9)
Immature Grans (Abs): 0 10*3/uL (ref 0.0–0.1)
Immature Granulocytes: 0 %
Lymphocytes Absolute: 2.4 10*3/uL (ref 0.7–3.1)
Lymphs: 32 %
MCH: 28.1 pg (ref 26.6–33.0)
MCHC: 34.1 g/dL (ref 31.5–35.7)
MCV: 83 fL (ref 79–97)
Monocytes Absolute: 0.5 10*3/uL (ref 0.1–0.9)
Monocytes: 6 %
Neutrophils Absolute: 4.3 10*3/uL (ref 1.4–7.0)
Neutrophils: 58 %
Platelets: 306 10*3/uL (ref 150–450)
RBC: 4.73 x10E6/uL (ref 3.77–5.28)
RDW: 13.4 % (ref 11.7–15.4)
WBC: 7.5 10*3/uL (ref 3.4–10.8)

## 2023-12-27 LAB — LIPID PANEL
Cholesterol, Total: 146 mg/dL (ref 100–199)
HDL: 34 mg/dL — ABNORMAL LOW (ref >39)
LDL CALC COMMENT:: 4.3 ratio (ref 0.0–4.4)
LDL Chol Calc (NIH): 84 mg/dL (ref 0–99)
Triglycerides: 160 mg/dL — ABNORMAL HIGH (ref 0–149)
VLDL Cholesterol Cal: 28 mg/dL (ref 5–40)

## 2023-12-27 LAB — CMP14+EGFR
ALT: 26 IU/L (ref 0–32)
AST: 19 IU/L (ref 0–40)
Albumin: 4.2 g/dL (ref 3.8–4.9)
Alkaline Phosphatase: 64 IU/L (ref 44–121)
BUN/Creatinine Ratio: 7 — ABNORMAL LOW (ref 9–23)
BUN: 6 mg/dL (ref 6–24)
Bilirubin Total: 0.3 mg/dL (ref 0.0–1.2)
CO2: 23 mmol/L (ref 20–29)
Calcium: 9.4 mg/dL (ref 8.7–10.2)
Chloride: 104 mmol/L (ref 96–106)
Creatinine, Ser: 0.81 mg/dL (ref 0.57–1.00)
Globulin, Total: 2.2 g/dL (ref 1.5–4.5)
Glucose: 97 mg/dL (ref 70–99)
Potassium: 4.1 mmol/L (ref 3.5–5.2)
Sodium: 142 mmol/L (ref 134–144)
Total Protein: 6.4 g/dL (ref 6.0–8.5)
eGFR: 87 mL/min/{1.73_m2} (ref >59)

## 2023-12-30 NOTE — Telephone Encounter (Signed)
Zepbound filled on 12/09/23

## 2024-01-03 ENCOUNTER — Telehealth: Payer: Self-pay | Admitting: Nurse Practitioner

## 2024-01-03 ENCOUNTER — Encounter: Payer: Self-pay | Admitting: Nurse Practitioner

## 2024-01-03 ENCOUNTER — Ambulatory Visit (INDEPENDENT_AMBULATORY_CARE_PROVIDER_SITE_OTHER): Payer: BC Managed Care – PPO | Admitting: Nurse Practitioner

## 2024-01-03 VITALS — BP 112/79 | HR 61 | Ht 65.0 in | Wt 265.0 lb

## 2024-01-03 DIAGNOSIS — G43809 Other migraine, not intractable, without status migrainosus: Secondary | ICD-10-CM | POA: Diagnosis not present

## 2024-01-03 MED ORDER — KETOROLAC TROMETHAMINE 60 MG/2ML IM SOLN
60.0000 mg | Freq: Once | INTRAMUSCULAR | Status: AC
  Administered 2024-01-03: 60 mg via INTRAMUSCULAR

## 2024-01-03 MED ORDER — TRAMADOL HCL 50 MG PO TABS: 50.0000 mg | ORAL_TABLET | Freq: Three times a day (TID) | ORAL | 0 refills | Status: AC | PRN

## 2024-01-03 NOTE — Patient Instructions (Signed)
Migraine Headache A migraine headache is a very strong throbbing pain on one or both sides of your head. This type of headache can also cause other symptoms. It can last from 4 hours to 3 days. Talk with your doctor about what things may bring on (trigger) this condition. What are the causes? The exact cause of a migraine is not known. This condition may be brought on or caused by: Smoking. Medicines, such as: Medicine used to treat chest pain (nitroglycerin). Birth control pills. Estrogen. Some blood pressure medicines. Certain substances in some foods or drinks. Foods and drinks, such as: Cheese. Chocolate. Alcohol. Caffeine. Doing physical activity that is very hard. Other things that may trigger a migraine headache include: Periods. Pregnancy. Hunger. Stress. Getting too much or too little sleep. Weather changes. Feeling tired (fatigue). What increases the risk? Being 25-55 years old. Being female. Having a family history of migraine headaches. Being Caucasian. Having a mental health condition, such as being sad (depressed) or feeling worried or nervous (anxious). Being very overweight (obese). What are the signs or symptoms? A throbbing pain. This pain may: Happen in any area of the head, such as on one or both sides. Make it hard to do daily activities. Get worse with physical activity. Get worse around bright lights, loud noises, or smells. Other symptoms may include: Feeling like you may vomit (nauseous). Vomiting. Dizziness. Before a migraine headache starts, you may get warning signs (an aura). An aura may include: Seeing flashing lights or having blind spots. Seeing bright spots, halos, or zigzag lines. Having tunnel vision or blurred vision. Having numbness or a tingling feeling. Having trouble talking. Having weak muscles. After a migraine ends, you may have symptoms. These may include: Tiredness. Trouble thinking (concentrating). How is this  treated? Taking medicines that: Relieve pain. Relieve the feeling like you may vomit. Prevent migraine headaches. Treatment may also include: Acupuncture. Lifestyle changes like avoiding foods that bring on migraine headaches. Learning ways to control your body functions (biofeedback). Therapy to help you know and deal with negative thoughts (cognitive behavioral therapy). Follow these instructions at home: Medicines Take over-the-counter and prescription medicines only as told by your doctor. If told, take steps to prevent problems with pooping (constipation). You may need to: Drink enough fluid to keep your pee (urine) pale yellow. Take medicines. You will be told what medicines to take. Eat foods that are high in fiber. These include beans, whole grains, and fresh fruits and vegetables. Limit foods that are high in fat and sugar. These include fried or sweet foods. Ask your doctor if you should avoid driving or using machines while you are taking your medicine. Lifestyle  Do not drink alcohol. Do not smoke or use any products that contain nicotine or tobacco. If you need help quitting, ask your doctor. Get 7-9 hours of sleep each night, or the amount recommended by your doctor. Find ways to deal with stress, such as meditation, deep breathing, or yoga. Try to exercise often. This can help lessen how bad and how often your migraines happen. General instructions Keep a journal to find out what may bring on your migraine headaches. This can help you avoid those things. For example, write down: What you eat and drink. How much sleep you get. Any change to your medicines or diet. If you have a migraine headache: Avoid things that make your symptoms worse, such as bright lights. Lie down in a dark, quiet room. Do not drive or use machinery. Ask your   doctor what activities are safe for you. Where to find more information Coalition for Headache and Migraine Patients (CHAMP):  headachemigraine.org American Migraine Foundation: americanmigrainefoundation.org National Headache Foundation: headaches.org Contact a doctor if: You get a migraine headache that is different or worse than others you have had. You have more than 15 days of headaches in one month. Get help right away if: Your migraine headache gets very bad. Your migraine headache lasts more than 72 hours. You have a fever or stiff neck. You have trouble seeing. Your muscles feel weak or like you cannot control them. You lose your balance a lot. You have trouble walking. You faint. You have a seizure. This information is not intended to replace advice given to you by your health care provider. Make sure you discuss any questions you have with your health care provider. Document Revised: 07/26/2022 Document Reviewed: 07/26/2022 Elsevier Patient Education  2024 [employer] Inc[employer]  

## 2024-01-03 NOTE — Telephone Encounter (Signed)
Name from pharmacy: ZEPBOUND 7.5 MG/0.5 ML PEN  Pharmacy comment: Alternative Requested:PRIOR AUTHORIZATION NEEDED FOR REPEAT DOSE OF 7.5MG .

## 2024-01-03 NOTE — Progress Notes (Signed)
Subjective:    Patient ID: Rachael Allen, female    DOB: [birth year], 53 y.o.   MRN: [id number]   Chief Complaint: Pain in right side of head (Off and on for a few days. Feels sore)   HPI  Pain on right side of head. Sore to touch. Started a couple of days ago. Rates pain 8/10. Describes as squeezing throbbing pain. She has  not taken anything for it.  Patient Active Problem List   Diagnosis Date Noted   Pain in left foot 02/11/2023   Lumbar radiculopathy 10/18/2022   Low back pain 09/02/2022   Mass of joint of right hip 07/28/2022   Primary hypertension 08/10/2021   Cervical stenosis of spine 07/03/2020   Diverticulosis 06/29/2017   Morbid obesity (HCC) 03/24/2016   OSA on CPAP 03/24/2016   Benign paroxysmal positional vertigo due to bilateral vestibular disorder 02/10/2016   Bipolar disorder (HCC) 01/26/2016   Depression 05/09/2014   Narcolepsy 05/09/2014   IUD (intrauterine device) in place 04/04/2014   Migraines 04/19/2013   Fibromyalgia muscle pain 03/07/2013   PCOS (polycystic ovarian syndrome) 03/07/2013   Fibrocystic breast 01/08/2013       Review of Systems  Constitutional:  Negative for diaphoresis.  Eyes:  Negative for photophobia, pain and visual disturbance.  Respiratory:  Negative for shortness of breath.   Cardiovascular:  Negative for chest pain, palpitations and leg swelling.  Gastrointestinal:  Negative for abdominal pain, nausea and vomiting.  Endocrine: Negative for polydipsia.  Skin:  Negative for rash.  Neurological:  Positive for headaches. Negative for dizziness and weakness.  Hematological:  Does not bruise/bleed easily.  All other systems reviewed and are negative.      Objective:   Physical Exam Constitutional:      Appearance: Normal appearance. She is obese.  Cardiovascular:     Rate and Rhythm: Normal rate and regular rhythm.     Heart sounds: Normal heart sounds.  Pulmonary:     Effort: Pulmonary effort is normal.     Breath  sounds: Normal breath sounds.  Skin:    General: Skin is warm.  Neurological:     General: No focal deficit present.     Mental Status: She is alert and oriented to person, place, and time.     Cranial Nerves: No cranial nerve deficit.     Sensory: No sensory deficit.  Psychiatric:        Behavior: Behavior normal.    BP 112/79   Pulse 61   Ht 5\' 5"  (1.651 m)   Wt 265 lb (120.2 kg)   SpO2 99%   BMI 44.10 kg/m         Assessment & Plan:   Rachael Allen in today with chief complaint of Pain in right side of head (Off and on for a few days. Feels sore)   1. Other migraine without status migrainosus, not intractable (Primary) Rest Ice bid RTO prn - ketorolac (TORADOL) injection 60 mg - traMADol (ULTRAM) 50 MG tablet; Take 1 tablet (50 mg total) by mouth every 8 (eight) hours as needed for up to 5 days.  Dispense: 15 tablet; Refill: 0    The above assessment and management plan was discussed with the patient. The patient verbalized understanding of and has agreed to the management plan. Patient is aware to call the clinic if symptoms persist or worsen. Patient is aware when to return to the clinic for a follow-up visit. Patient educated on when it is  appropriate to go to the emergency department.   Mary-Margaret Daphine Deutscher, FNP

## 2024-01-04 ENCOUNTER — Telehealth: Payer: Self-pay | Admitting: Family Medicine

## 2024-01-04 ENCOUNTER — Other Ambulatory Visit (HOSPITAL_COMMUNITY): Payer: Self-pay

## 2024-01-04 ENCOUNTER — Telehealth: Payer: Self-pay

## 2024-01-04 NOTE — Telephone Encounter (Signed)
Copied from CRM [id number]. Topic: Clinical - Prescription Issue >> Jan 04, 2024  4:43 PM Shelah Lewandowsky wrote: Reason for CRM: ZEPBOUND 7.5 MG/0.5ML Pen [Pharmacy Med Name: ZEPBOUND 7.5 MG/0.5 ML PEN] did not get response for refill

## 2024-01-04 NOTE — Telephone Encounter (Unsigned)
Copied from CRM [id number]. Topic: Clinical - Prescription Issue >> Jan 03, 2024  4:48 PM Rachael Allen wrote: Reason for CRM: tirzepatide (ZEPBOUND) 7.5 MG/0.5ML Pen needs patient level authorization unless Mary-Margaret wants different dose, Please call [phone] for the patient level auth.

## 2024-01-04 NOTE — Telephone Encounter (Signed)
Please provide reason why patient needs to stay on 7.5mg  dose and cannot titrate up to 10mg .   Ran test claim, 10mg  dose went through insurance with no PA needed.

## 2024-01-05 MED ORDER — ZEPBOUND 10 MG/0.5ML ~~LOC~~ SOAJ: 10.0000 mg | SUBCUTANEOUS | 2 refills | Status: DC

## 2024-01-05 NOTE — Telephone Encounter (Signed)
Patient notified and verbalized understanding. 

## 2024-01-09 ENCOUNTER — Other Ambulatory Visit: Payer: Self-pay | Admitting: Nurse Practitioner

## 2024-01-09 DIAGNOSIS — N6320 Unspecified lump in the left breast, unspecified quadrant: Secondary | ICD-10-CM

## 2024-01-12 ENCOUNTER — Ambulatory Visit
Admission: RE | Admit: 2024-01-12 | Discharge: 2024-01-12 | Disposition: A | Discharge status: Home / Self Care | Payer: BC Managed Care – PPO | Source: Ambulatory Visit | Attending: Nurse Practitioner | Admitting: Nurse Practitioner

## 2024-01-12 ENCOUNTER — Other Ambulatory Visit: Payer: Self-pay | Admitting: Nurse Practitioner

## 2024-01-12 ENCOUNTER — Telehealth: Payer: Self-pay | Admitting: Nurse Practitioner

## 2024-01-12 DIAGNOSIS — R222 Localized swelling, mass and lump, trunk: Secondary | ICD-10-CM

## 2024-01-12 DIAGNOSIS — N6320 Unspecified lump in the left breast, unspecified quadrant: Secondary | ICD-10-CM

## 2024-01-12 DIAGNOSIS — N6321 Unspecified lump in the left breast, upper outer quadrant: Secondary | ICD-10-CM

## 2024-01-12 DIAGNOSIS — N6332 Unspecified lump in axillary tail of the left breast: Secondary | ICD-10-CM | POA: Diagnosis not present

## 2024-01-12 DIAGNOSIS — R590 Localized enlarged lymph nodes: Secondary | ICD-10-CM | POA: Diagnosis not present

## 2024-01-12 NOTE — Progress Notes (Signed)
Chest CT ordered as suggested   Orders Placed This Encounter  Procedures   MR CHEST W WO CONTRAST    Needs open CT scan    Standing Status:   Future    Expiration Date:   01/11/2025    If indicated for the ordered procedure, I authorize the administration of contrast media per Radiology protocol:   Yes    What is the patient's sedation requirement?:   No Sedation    Does the patient have a pacemaker or implanted devices?:   No    Preferred imaging location?:   [facility] (table limit-550lbs)    Mary-Margaret Daphine Deutscher, FNP

## 2024-01-12 NOTE — Telephone Encounter (Signed)
Please review the imaging for patient.

## 2024-01-13 ENCOUNTER — Other Ambulatory Visit: Payer: Self-pay

## 2024-01-13 DIAGNOSIS — R222 Localized swelling, mass and lump, trunk: Secondary | ICD-10-CM

## 2024-01-16 DIAGNOSIS — R110 Nausea: Secondary | ICD-10-CM | POA: Diagnosis not present

## 2024-01-16 DIAGNOSIS — U071 COVID-19: Secondary | ICD-10-CM | POA: Diagnosis not present

## 2024-01-16 DIAGNOSIS — R070 Pain in throat: Secondary | ICD-10-CM | POA: Diagnosis not present

## 2024-01-16 DIAGNOSIS — Z20822 Contact with and (suspected) exposure to covid-19: Secondary | ICD-10-CM | POA: Diagnosis not present

## 2024-01-17 ENCOUNTER — Ambulatory Visit: Payer: Medicare Other

## 2024-01-18 ENCOUNTER — Telehealth: Payer: Self-pay

## 2024-01-18 NOTE — Telephone Encounter (Signed)
 [facility] called they need prior auth for CT scan - patient has appt schedule for Monday morning, they need auth before Friday @10  or they will have to cancel her appt.

## 2024-01-19 ENCOUNTER — Other Ambulatory Visit: Payer: BC Managed Care – PPO

## 2024-01-19 NOTE — Telephone Encounter (Signed)
 Authorization information has been added to Referral Notes :)

## 2024-01-20 NOTE — Telephone Encounter (Signed)
 [facility] was notified of the auth information

## 2024-01-23 ENCOUNTER — Ambulatory Visit
Admission: RE | Admit: 2024-01-23 | Discharge: 2024-01-23 | Disposition: A | Discharge status: Home / Self Care | Payer: BC Managed Care – PPO | Source: Ambulatory Visit | Attending: Nurse Practitioner | Admitting: Nurse Practitioner

## 2024-01-23 MED ORDER — IOPAMIDOL (ISOVUE-300) INJECTION 61%
75.0000 mL | Freq: Once | INTRAVENOUS | Status: AC | PRN
Start: 2024-01-23 — End: 2024-01-23
  Administered 2024-01-23: 75 mL via INTRAVENOUS

## 2024-01-29 ENCOUNTER — Other Ambulatory Visit: Payer: Self-pay | Admitting: Family Medicine

## 2024-01-29 DIAGNOSIS — R110 Nausea: Secondary | ICD-10-CM

## 2024-02-02 ENCOUNTER — Ambulatory Visit: Payer: Self-pay | Admitting: Nurse Practitioner

## 2024-02-06 ENCOUNTER — Telehealth: Payer: Self-pay | Admitting: Nurse Practitioner

## 2024-02-06 NOTE — Telephone Encounter (Signed)
 No Show has been removed

## 2024-02-06 NOTE — Telephone Encounter (Signed)
 Can you unmark as NO SHOW?  Copied from CRM [id number]. Topic: Appointments - Scheduling Inquiry for Clinic >> Feb 06, 2024 10:15 AM Thomes Dinning wrote: Reason for CRM: Patient states on 02/02/2024 she arrived at her appointment but was advised she was a year too early. She is upset that she is being marked as a no show and would like it changed

## 2024-02-16 ENCOUNTER — Other Ambulatory Visit (HOSPITAL_COMMUNITY): Payer: Self-pay

## 2024-04-02 ENCOUNTER — Ambulatory Visit: Payer: Self-pay

## 2024-04-02 NOTE — Telephone Encounter (Signed)
 Chief Complaint: hippain Symptoms: hip pain Frequency: x 3 days Pertinent Negatives: Patient denies injury Disposition: [] ED /[] Urgent Care (no appt availability in office) / [x] Appointment(In office/virtual)/ []  [facility]/ [] Home Care/ [] Refused Recommended Disposition /[] [facility]/ []  Follow-up with PCP Additional Notes: states pain in both hips but left hip worse. States that she has always dealt with hip pain off and on but it  started being constant the last couple days. States pain 7/10 dull aching pain. States she has taken ibuprofen and gets pain down to 5/10.    Copied from CRM [id number]. Topic: Appointments - Appointment Scheduling >> Apr 02, 2024  8:58 AM Emylou G wrote: Hips are hurting.. worsening.. patient number on file good Reason for Disposition  [1] MODERATE pain (e.g., interferes with normal activities, limping) AND [2] present > 3 days  Answer Assessment - Initial Assessment Questions 1. LOCATION and RADIATION: "Where is the pain located?"      Bilateral  2. QUALITY: "What does the pain feel like?"  (e.g., sharp, dull, aching, burning)     Dull ache 3. SEVERITY: "How bad is the pain?" "What does it keep you from doing?"   (Scale 1-10; or mild, moderate, severe)   -  MILD (1-3): doesn't interfere with normal activities    -  MODERATE (4-7): interferes with normal activities (e.g., work or school) or awakens from sleep, limping    -  SEVERE (8-10): excruciating pain, unable to do any normal activities, unable to walk     7/10 4. ONSET: "When did the pain start?" "Does it come and go, or is it there all the time?"     constant 5. WORK OR EXERCISE: "Has there been any recent work or exercise that involved this part of the body?"      no 6. CAUSE: "What do you think is causing the hip pain?"      no 7. AGGRAVATING FACTORS: "What makes the hip pain worse?" (e.g., walking, climbing stairs, running)     no 8. OTHER SYMPTOMS: "Do you have any other  symptoms?" (e.g., back pain, pain shooting down leg,  fever, rash)     no  Protocols used: Hip Pain-A-AH

## 2024-04-03 ENCOUNTER — Ambulatory Visit: Admitting: Nurse Practitioner

## 2024-04-10 ENCOUNTER — Other Ambulatory Visit: Payer: Self-pay | Admitting: Family Medicine

## 2024-04-10 DIAGNOSIS — I10 Essential (primary) hypertension: Secondary | ICD-10-CM

## 2024-04-17 ENCOUNTER — Ambulatory Visit (INDEPENDENT_AMBULATORY_CARE_PROVIDER_SITE_OTHER): Admitting: Nurse Practitioner

## 2024-04-17 ENCOUNTER — Encounter: Payer: Self-pay | Admitting: Nurse Practitioner

## 2024-04-17 VITALS — BP 140/86 | HR 55 | Temp 97.6°F | Ht 65.0 in | Wt 271.0 lb

## 2024-04-17 DIAGNOSIS — M25552 Pain in left hip: Secondary | ICD-10-CM | POA: Diagnosis not present

## 2024-04-17 DIAGNOSIS — M25551 Pain in right hip: Secondary | ICD-10-CM | POA: Diagnosis not present

## 2024-04-17 MED ORDER — METHYLPREDNISOLONE ACETATE 80 MG/ML IJ SUSP
80.0000 mg | Freq: Once | INTRAMUSCULAR | Status: AC
  Administered 2024-04-17: 80 mg via INTRAMUSCULAR

## 2024-04-17 MED ORDER — KETOROLAC TROMETHAMINE 60 MG/2ML IM SOLN
60.0000 mg | Freq: Once | INTRAMUSCULAR | Status: AC
  Administered 2024-04-17: 60 mg via INTRAMUSCULAR

## 2024-04-17 NOTE — Progress Notes (Signed)
   Subjective:    Patient ID: Rachael Allen, female    DOB: 27-Oct-1971, 53 y.o.   MRN: [id number]   Chief Complaint: Hip Pain (Bilateral but right is worse/)   Hip Pain  The incident occurred 3 to 5 days ago. There was no injury mechanism. The pain is present in the right hip. The quality of the pain is described as aching. The pain is at a severity of 7/10. The pain is moderate. The pain has been Constant since onset. Associated symptoms include an inability to bear weight. Pertinent negatives include no numbness or tingling. The symptoms are aggravated by movement and weight bearing. She has tried NSAIDs for the symptoms. The treatment provided mild relief.    Patient Active Problem List   Diagnosis Date Noted   Pain in left foot 02/11/2023   Lumbar radiculopathy 10/18/2022   Low back pain 09/02/2022   Mass of joint of right hip 07/28/2022   Primary hypertension 08/10/2021   Cervical stenosis of spine 07/03/2020   Diverticulosis 06/29/2017   Morbid obesity (HCC) 03/24/2016   OSA on CPAP 03/24/2016   Benign paroxysmal positional vertigo due to bilateral vestibular disorder 02/10/2016   Bipolar disorder (HCC) 01/26/2016   Depression 05/09/2014   Narcolepsy 05/09/2014   IUD (intrauterine device) in place 04/04/2014   Migraines 04/19/2013   Fibromyalgia muscle pain 03/07/2013   PCOS (polycystic ovarian syndrome) 03/07/2013   Fibrocystic breast 01/08/2013       Review of Systems  Musculoskeletal:  Positive for arthralgias (bl hip- right worse).  Neurological:  Negative for tingling and numbness.       Objective:   Physical Exam Constitutional:      Appearance: Normal appearance. She is obese.  Cardiovascular:     Rate and Rhythm: Normal rate and regular rhythm.     Heart sounds: Normal heart sounds.  Pulmonary:     Effort: Pulmonary effort is normal.     Breath sounds: Normal breath sounds.  Musculoskeletal:     Comments: FROM of bil hips with pain on internal and  external rotation.  Skin:    General: Skin is warm.  Neurological:     General: No focal deficit present.     Mental Status: She is alert and oriented to person, place, and time.  Psychiatric:        Mood and Affect: Mood normal.        Behavior: Behavior normal.    BP (!) 140/86   Pulse (!) 55   Temp 97.6 F (36.4 C) (Temporal)   Ht 5\' 5"  (1.651 m)   Wt 271 lb (122.9 kg)   SpO2 98%   BMI 45.10 kg/m         Assessment & Plan:   Rachael Allen in today with chief complaint of Hip Pain (Bilateral but right is worse/)   1. Bilateral hip pain (Primary) Moist heat Rest RTO prn - methylPREDNISolone  acetate (DEPO-MEDROL ) injection 80 mg - ketorolac  (TORADOL ) injection 60 mg    The above assessment and management plan was discussed with the patient. The patient verbalized understanding of and has agreed to the management plan. Patient is aware to call the clinic if symptoms persist or worsen. Patient is aware when to return to the clinic for a follow-up visit. Patient educated on when it is appropriate to go to the emergency department.   Mary-Margaret Gaylyn Keas, FNP

## 2024-04-17 NOTE — Patient Instructions (Signed)
 Hip Pain The hip is the joint between the upper legs and the lower pelvis. The bones, cartilage, tendons, and muscles of your hip joint support your body and allow you to move around. Hip pain can range from a minor ache to severe pain in one or both of your hips. The pain may be felt on the inside of the hip joint near the groin, or on the outside near the buttocks and upper thigh. You may also have swelling or stiffness in your hip area. Follow these instructions at home: Managing pain, stiffness, and swelling     If told, put ice on the painful area. Put ice in a plastic bag. Place a towel between your skin and the bag. Leave the ice on for 20 minutes, 2-3 times a day. If told, apply heat to the affected area as often as told by your health care provider. Use the heat source that your provider recommends, such as a moist heat pack or a heating pad. Place a towel between your skin and the heat source. Leave the heat on for 20-30 minutes. If your skin turns bright red, remove the ice or heat right away to prevent skin damage. The risk of damage is higher if you cannot feel pain, heat, or cold. Activity Do exercises as told by your provider. Avoid activities that cause pain. General instructions  Take over-the-counter and prescription medicines only as told by your provider. Keep a journal of your symptoms. Write down: How often you have hip pain. The location of your pain. What the pain feels like. What makes the pain worse. Sleep with a pillow between your legs on your most comfortable side. Keep all follow-up visits. Your provider will monitor your pain and activity. Contact a health care provider if: You cannot put weight on your leg. Your pain or swelling gets worse after a week. It gets harder to walk. You have a fever. Get help right away if: You fall. You have a sudden increase in pain and swelling in your hip. Your hip is red or swollen or very tender to touch. This  information is not intended to replace advice given to you by your health care provider. Make sure you discuss any questions you have with your health care provider. Document Revised: 08/03/2022 Document Reviewed: 08/03/2022 Elsevier Patient Education  2024 [employer].

## 2024-04-20 ENCOUNTER — Ambulatory Visit: Payer: Self-pay

## 2024-04-20 MED ORDER — VALACYCLOVIR HCL 1 G PO TABS: ORAL_TABLET | ORAL | 1 refills | Status: DC

## 2024-04-20 NOTE — Telephone Encounter (Signed)
 Patient called, left VM to return the call to the office to speak to NT.    Copied from CRM [id number]. Topic: Clinical - Medication Question >> Apr 20, 2024 11:19 AM Brynn Caras wrote: Reason for CRM: Tiny fever blister (cold sore) has formed on the patient's lip, with no accompanied symptoms, she is requesting her PCP to send an antiviral medication to her pharmacy, if possible? Preferred pharmacy: [id number] - [place], [place] - [address] Callback # for Jawanna:[phone]

## 2024-04-20 NOTE — Telephone Encounter (Signed)
 Valtrex  would not go electronically so called and gave verbal to pharmacy. Patient notified and verbalized understanding

## 2024-04-20 NOTE — Telephone Encounter (Signed)
 Mouth wash with peroxide

## 2024-04-20 NOTE — Telephone Encounter (Signed)
 Chief Complaint: cold sore Symptoms: see above  Frequency: today Pertinent Negatives: Patient denies fever, chills, redness, streaking redness, sores inside her mouth or eyes, vomiting, immunosuppression Disposition: [] ED /[] Urgent Care (no appt availability in office) / [] Appointment(In office/virtual)/ []  [facility]/ [] Home Care/ [] Refused Recommended Disposition /[]  Mobile Bus/ [x]  Follow-up with PCP Additional Notes: Pt states she developed a cold sore on her top lip today the size of a pencil eraser. Pt was prescribed valacyclovir  in the past and is out. Pt requests a new prescription. Pt denies redness  or streaking redness. Denies fever. Denies pain. Denies chills. Denies sores in her eye, eyelid, or tip of nose. RN advised pt RN would relay request to the provider for follow-up.     Reason for Disposition  Cold sores without complications  Answer Assessment - Initial Assessment Questions 1. APPEARANCE of BLISTERS: "Describe the sores."     Just one  2. SIZE: "How large an area is involved with the cold sores?" (e.g., inches, cm or compare to coins)     End of pencil eraser 3. LOCATION: "Which part of the lip is involved?"     Top lip  4. ONSET: "When did the fever blisters begin?"     Today  5. RECURRENT BLISTERS: "Have you had fever blisters before?" If Yes, ask: "When was the last time?" "How many times a year?"     Yes - prescribed valtrex   6. OTHER SYMPTOMS: "Do you have any other symptoms?" (e.g., fever, sores inside mouth)     Pain is "not too bad"  Protocols used: Cold Sores (Fever Blisters)-A-AH

## 2024-04-20 NOTE — Addendum Note (Signed)
 Addended by: Cherylyn Cos on: 04/20/2024 04:55 PM   Modules accepted: Orders

## 2024-04-30 NOTE — Telephone Encounter (Signed)
 Spoke with pt stated she is feeling better and will call back to schedule appt

## 2024-05-08 ENCOUNTER — Ambulatory Visit: Payer: Self-pay | Admitting: *Deleted

## 2024-05-08 NOTE — Telephone Encounter (Signed)
 Appointment made by [email or web address].

## 2024-05-08 NOTE — Telephone Encounter (Signed)
 Copied from CRM [id number]. Topic: Clinical - Red Word Triage >> May 08, 2024  8:15 AM Juluis Ok wrote: [facility] that prompted transfer to Nurse Triage: pain and pressure in lower left abdomen Reason for Disposition  [1] MODERATE pain (e.g., interferes with normal activities) AND [2] pain comes and goes (cramps) AND [3] present > 24 hours  (Exception: Pain with Vomiting or Diarrhea - see that Guideline.)  Answer Assessment - Initial Assessment Questions 1. LOCATION: "Where does it hurt?"      Pt called in but line was disconnected when [facility] went to transfer call to me.    I called pt back.  I'm having pain in my left lower abd.   I've had this before.   Diverticulitis.   2. RADIATION: "Does the pain shoot anywhere else?" (e.g., chest, back)     No 3. ONSET: "When did the pain begin?" (e.g., minutes, hours or days ago)      Started Saturday before last      It got better but then Friday it started getting worse.  4. SUDDEN: "Gradual or sudden onset?"     Not asked 5. PATTERN "Does the pain come and go, or is it constant?"    - If it comes and goes: "How long does it last?" "Do you have pain now?"     (Note: Comes and goes means the pain is intermittent. It goes away completely between bouts.)    - If constant: "Is it getting better, staying the same, or getting worse?"      (Note: Constant means the pain never goes away completely; most serious pain is constant and gets worse.)      Always there but fluctuates 6. SEVERITY: "How bad is the pain?"  (e.g., Scale 1-10; mild, moderate, or severe)    - MILD (1-3): Doesn't interfere with normal activities, abdomen soft and not tender to touch.     - MODERATE (4-7): Interferes with normal activities or awakens from sleep, abdomen tender to touch.     - SEVERE (8-10): Excruciating pain, doubled over, unable to do any normal activities.       At worst it's 7/10 7. RECURRENT SYMPTOM: "Have you ever had this type of stomach pain before?" If Yes, ask: "When  was the last time?" and "What happened that time?"      Yes 8. CAUSE: "What do you think is causing the stomach pain?"     Diverticulitis    9. RELIEVING/AGGRAVATING FACTORS: "What makes it better or worse?" (e.g., antacids, bending or twisting motion, bowel movement)     nothing 10. OTHER SYMPTOMS: "Do you have any other symptoms?" (e.g., back pain, diarrhea, fever, urination pain, vomiting)       I've had some diarrhea.    No urinary symptoms 11. PREGNANCY: "Is there any chance you are pregnant?" "When was your last menstrual period?"       Not asked  Protocols used: Abdominal Pain - [facility]  Chief Complaint: Left lower abd pain with diarrhea that started 04/28/2024.  Has a history of diverticulitis.   This feels the same.   Symptoms: above Frequency: Since Sat. 5/17. Pertinent Negatives: Patient denies vomiting or fever.  Disposition: [] ED /[] Urgent Care (no appt availability in office) / [x] Appointment(In office/virtual)/ []  [place] [facility]/ [] Home Care/ [] Refused Recommended Disposition /[] [place] [facility]/ []  Follow-up with PCP Additional Notes: Appt made with Doctor of the Day for 05/09/2024 which is Jacqualyn Mates, FNP at 8:35 AM.  No appts open today (05/08/2024) with any of the providers.

## 2024-05-09 ENCOUNTER — Ambulatory Visit (INDEPENDENT_AMBULATORY_CARE_PROVIDER_SITE_OTHER)

## 2024-05-09 ENCOUNTER — Encounter: Payer: Self-pay | Admitting: Family Medicine

## 2024-05-09 VITALS — BP 114/74 | HR 58 | Temp 97.5°F | Ht 65.0 in | Wt 267.2 lb

## 2024-05-09 DIAGNOSIS — R1032 Left lower quadrant pain: Secondary | ICD-10-CM

## 2024-05-09 LAB — CBC WITH DIFFERENTIAL/PLATELET
Basophils Absolute: 0.1 10*3/uL (ref 0.0–0.2)
Basos: 1 %
EOS (ABSOLUTE): 0.3 10*3/uL (ref 0.0–0.4)
Eos: 3 %
Hematocrit: 40 % (ref 34.0–46.6)
Hemoglobin: 13.4 g/dL (ref 11.1–15.9)
Immature Grans (Abs): 0.1 10*3/uL (ref 0.0–0.1)
Immature Granulocytes: 1 %
Lymphocytes Absolute: 2.3 10*3/uL (ref 0.7–3.1)
Lymphs: 26 %
MCH: 28.5 pg (ref 26.6–33.0)
MCHC: 33.5 g/dL (ref 31.5–35.7)
MCV: 85 fL (ref 79–97)
Monocytes Absolute: 0.5 10*3/uL (ref 0.1–0.9)
Monocytes: 5 %
Neutrophils Absolute: 5.5 10*3/uL (ref 1.4–7.0)
Neutrophils: 64 %
Platelets: 320 10*3/uL (ref 150–450)
RBC: 4.7 x10E6/uL (ref 3.77–5.28)
RDW: 14 % (ref 11.7–15.4)
WBC: 8.7 10*3/uL (ref 3.4–10.8)

## 2024-05-09 LAB — CMP14+EGFR
ALT: 18 IU/L (ref 0–32)
AST: 13 IU/L (ref 0–40)
Albumin: 4.2 g/dL (ref 3.8–4.9)
Alkaline Phosphatase: 71 IU/L (ref 44–121)
BUN/Creatinine Ratio: 7 — ABNORMAL LOW (ref 9–23)
BUN: 6 mg/dL (ref 6–24)
Bilirubin Total: 0.2 mg/dL (ref 0.0–1.2)
CO2: 21 mmol/L (ref 20–29)
Calcium: 9.4 mg/dL (ref 8.7–10.2)
Chloride: 101 mmol/L (ref 96–106)
Creatinine, Ser: 0.86 mg/dL (ref 0.57–1.00)
Globulin, Total: 2.4 g/dL (ref 1.5–4.5)
Glucose: 112 mg/dL — ABNORMAL HIGH (ref 70–99)
Potassium: 4.1 mmol/L (ref 3.5–5.2)
Sodium: 139 mmol/L (ref 134–144)
Total Protein: 6.6 g/dL (ref 6.0–8.5)
eGFR: 81 mL/min/{1.73_m2} (ref >59)

## 2024-05-09 MED ORDER — AMOXICILLIN-POT CLAVULANATE ER 1000-62.5 MG PO TB12: 2.0000 | ORAL_TABLET | Freq: Two times a day (BID) | ORAL | 0 refills | Status: DC

## 2024-05-09 MED ORDER — FLUCONAZOLE 150 MG PO TABS: 150.0000 mg | ORAL_TABLET | Freq: Once | ORAL | 0 refills | Status: AC

## 2024-05-09 NOTE — Progress Notes (Signed)
 Subjective:  Patient ID: Rachael Allen, female    DOB: 1971/07/05, 53 y.o.   MRN: [id number]  Patient Care Team: Delfina Feller, FNP as PCP - General (Nurse Practitioner) [facility] (Optometry)   Chief Complaint:  Abdominal Pain (Lower left side pain for about a week and a half. Diarrhea )  HPI: Rachael Allen is a 53 y.o. female presenting on 05/09/2024 for Abdominal Pain (Lower left side pain for about a week and a half. Diarrhea )  Abdominal Pain   States that she has pain and pressure in her left lower abdomen. States that it is similar to diverticulitis flares in the past. States that the first time she had diverticulitis she was in the ED and received IV abx. The second time she was treated as an outpatient. Symptoms started last Saturday about 1.5 weeks ago. Endorses diarrhea. Denies fever, however, taking tylenol  for pain. Denies N/V. Taking multiple vitamins.   Relevant past medical, surgical, family, and social history reviewed and updated as indicated.  Allergies and medications reviewed and updated. Data reviewed: Chart in Epic.   Past Medical History:  Diagnosis Date   Bipolar disorder (HCC)    Depression     Past Surgical History:  Procedure Laterality Date   CESAREAN SECTION     TONSILLECTOMY AND ADENOIDECTOMY      Social History   Socioeconomic History   Marital status: Married    Spouse name: Not on file   Number of children: Not on file   Years of education: Not on file   Highest education level: Associate degree: academic program  Occupational History   Not on file  Tobacco Use   Smoking status: Never   Smokeless tobacco: Never  Vaping Use   Vaping status: Not on file  Substance and Sexual Activity   Alcohol use: No   Drug use: No   Sexual activity: Yes    Birth control/protection: I.U.D.  Other Topics Concern   Not on file  Social History Narrative   Not on file   Social Drivers of Health    Financial Resource Strain: Medium Risk (12/23/2023)   Overall Financial Resource Strain (CARDIA)    Difficulty of Paying Living Expenses: Somewhat hard  Food Insecurity: Patient Declined (12/23/2023)   Hunger Vital Sign    Worried About Running Out of Food in the Last Year: Patient declined    Ran Out of Food in the Last Year: Patient declined  Transportation Needs: No Transportation Needs (12/23/2023)   PRAPARE - Administrator, Civil Service (Medical): No    Lack of Transportation (Non-Medical): No  Physical Activity: Inactive (12/23/2023)   Exercise Vital Sign    Days of Exercise per Week: 0 days    Minutes of Exercise per Session: 30 min  Stress: No Stress Concern Present (12/23/2023)   [employer] of Occupational Health - Occupational Stress Questionnaire    Feeling of Stress : Only a little  Social Connections: Unknown (12/23/2023)   Social Connection and Isolation Panel [NHANES]    Frequency of Communication with Friends and Family: More than three times a week    Frequency of Social Gatherings with Friends and Family: Once a week    Attends Religious Services: Patient declined    Database administrator or Organizations: Yes    Attends Banker Meetings: 1 to 4 times per year    Marital Status: Married  Catering manager Violence: Unknown (  03/16/2022)   Received from [facility]   HITS    Physically Hurt: Not on file    Insult or Talk Down To: Not on file    Threaten Physical Harm: Not on file    Scream or Curse: Not on file    Outpatient Encounter Medications as of 05/09/2024  Medication Sig   albuterol  (VENTOLIN  HFA) 108 (90 Base) MCG/ACT inhaler Inhale 2 puffs into the lungs every 6 (six) hours as needed for wheezing or shortness of breath.   [employer]S B-12 500 MCG tablet Take 500 mcg by mouth daily.   hydrocortisone  1 % lotion Apply 1 Application topically 2 (two) times daily.   lisinopril  (ZESTRIL ) 20 MG tablet TAKE 1 TABLET DAILY    lurasidone  (LATUDA ) 40 MG TABS tablet Take 1 tablet (40 mg total) by mouth daily with breakfast.   metFORMIN  (GLUCOPHAGE -XR) 500 MG 24 hr tablet Take 1 tablet (500 mg total) by mouth 2 (two) times daily.   ondansetron  (ZOFRAN ) 4 MG tablet TAKE 1 TABLET BY MOUTH EVERY 8 HOURS AS NEEDED FOR NAUSEA AND VOMITING   tirzepatide  (ZEPBOUND ) 10 MG/0.5ML Pen Inject 10 mg into the skin once a week.   valACYclovir  (VALTREX ) 1000 MG tablet TAKE 2 TABLETS TWICE A DAY BY MOUTH FOR 1 DAY THEN AS NEEDED FOR FEVER BLISTERS   No facility-administered encounter medications on file as of 05/09/2024.    Allergies  Allergen Reactions   Oxycodone -Acetaminophen  Itching    vomiting   Nitrofurantoin Other (See Comments)    SEVERE HEADACHE   Benadryl [Diphenhydramine Hcl]    Dilaudid [Hydromorphone Hcl]    Latex    Nsaids Nausea Only    In large doses   Vicodin [Hydrocodone-Acetaminophen ]     Review of Systems  Gastrointestinal:  Positive for abdominal pain.   Objective:  BP 114/74   Pulse (!) 58   Temp (!) 97.5 F (36.4 C)   Ht 5\' 5"  (1.651 m)   Wt 267 lb 3.2 oz (121.2 kg)   SpO2 96%   BMI 44.46 kg/m    Wt Readings from Last 3 Encounters:  05/09/24 267 lb 3.2 oz (121.2 kg)  04/17/24 271 lb (122.9 kg)  01/03/24 265 lb (120.2 kg)   Physical Exam Constitutional:      General: She is awake. She is not in acute distress.    Appearance: Normal appearance. She is well-developed and well-groomed. She is obese. She is not ill-appearing, toxic-appearing or diaphoretic.  Cardiovascular:     Rate and Rhythm: Normal rate and regular rhythm.     Heart sounds: Normal heart sounds.  Pulmonary:     Effort: Pulmonary effort is normal.     Breath sounds: Normal breath sounds.  Abdominal:     General: Bowel sounds are normal.     Palpations: Abdomen is soft. There is no shifting dullness or fluid wave.     Tenderness: There is abdominal tenderness in the left lower quadrant. There is no right CVA  tenderness, left CVA tenderness, guarding or rebound.     Hernia: No hernia is present.  Skin:    General: Skin is warm.     Capillary Refill: Capillary refill takes less than 2 seconds.  Neurological:     General: No focal deficit present.     Mental Status: She is alert and oriented to person, place, and time. Mental status is at baseline.  Psychiatric:        Attention and Perception: Attention and perception  normal.        Mood and Affect: Mood and affect normal.        Speech: Speech normal.        Behavior: Behavior normal. Behavior is cooperative.        Thought Content: Thought content normal.        Cognition and Memory: Cognition and memory normal.        Judgment: Judgment normal.     Results for orders placed or performed in visit on 12/26/23  Bayer DCA Hb A1c Waived   Collection Time: 12/26/23  3:37 PM  Result Value Ref Range   HB A1C (BAYER DCA - WAIVED) 5.1 4.8 - 5.6 %  CBC with Differential/Platelet   Collection Time: 12/26/23  3:39 PM  Result Value Ref Range   WBC 7.5 3.4 - 10.8 x10E3/uL   RBC 4.73 3.77 - 5.28 x10E6/uL   Hemoglobin 13.3 11.1 - 15.9 g/dL   Hematocrit 16.1 09.6 - 46.6 %   MCV 83 79 - 97 fL   MCH 28.1 26.6 - 33.0 pg   MCHC 34.1 31.5 - 35.7 g/dL   RDW 04.5 40.9 - 81.1 %   Platelets 306 150 - 450 x10E3/uL   Neutrophils 58 Not Estab. %   Lymphs 32 Not Estab. %   Monocytes 6 Not Estab. %   Eos 3 Not Estab. %   Basos 1 Not Estab. %   Neutrophils Absolute 4.3 1.4 - 7.0 x10E3/uL   Lymphocytes Absolute 2.4 0.7 - 3.1 x10E3/uL   Monocytes Absolute 0.5 0.1 - 0.9 x10E3/uL   EOS (ABSOLUTE) 0.2 0.0 - 0.4 x10E3/uL   Basophils Absolute 0.1 0.0 - 0.2 x10E3/uL   Immature Granulocytes 0 Not Estab. %   Immature Grans (Abs) 0.0 0.0 - 0.1 x10E3/uL  CMP14+EGFR   Collection Time: 12/26/23  3:39 PM  Result Value Ref Range   Glucose 97 70 - 99 mg/dL   BUN 6 6 - 24 mg/dL   Creatinine, Ser 9.14 0.57 - 1.00 mg/dL   eGFR 87 >78 GN/FAO/1.30   BUN/Creatinine  Ratio 7 (L) 9 - 23   Sodium 142 134 - 144 mmol/L   Potassium 4.1 3.5 - 5.2 mmol/L   Chloride 104 96 - 106 mmol/L   CO2 23 20 - 29 mmol/L   Calcium 9.4 8.7 - 10.2 mg/dL   Total Protein 6.4 6.0 - 8.5 g/dL   Albumin 4.2 3.8 - 4.9 g/dL   Globulin, Total 2.2 1.5 - 4.5 g/dL   Bilirubin Total 0.3 0.0 - 1.2 mg/dL   Alkaline Phosphatase 64 44 - 121 IU/L   AST 19 0 - 40 IU/L   ALT 26 0 - 32 IU/L  Lipid panel   Collection Time: 12/26/23  3:39 PM  Result Value Ref Range   Cholesterol, Total 146 100 - 199 mg/dL   Triglycerides 865 (H) 0 - 149 mg/dL   HDL 34 (L) >78 mg/dL   VLDL Cholesterol Cal 28 5 - 40 mg/dL   LDL Chol Calc (NIH) 84 0 - 99 mg/dL   Chol/HDL Ratio 4.3 0.0 - 4.4 ratio       12/26/2023    3:37 PM 10/21/2023    1:21 PM 10/04/2023   10:28 AM 08/23/2023    2:07 PM 05/30/2023   11:28 AM  Depression screen PHQ 2/9  Decreased Interest 1 1 1 1 1   Down, Depressed, Hopeless 0 0 0 1 0  PHQ - 2 Score 1 1 1 2  1  Altered sleeping 0 1 1 1  0  Tired, decreased energy 1 1 0 0 1  Change in appetite 0 0 0 0 0  Feeling bad or failure about yourself  0 0 0 1 0  Trouble concentrating 1 1 0 0 1  Moving slowly or fidgety/restless 0 0 0 0 0  Suicidal thoughts 0 0 0 0 0  PHQ-9 Score 3 4 2 4 3   Difficult doing work/chores Somewhat difficult Somewhat difficult Somewhat difficult Somewhat difficult Somewhat difficult       12/26/2023    3:37 PM 10/21/2023    1:22 PM 10/04/2023   10:28 AM 08/23/2023    2:08 PM  GAD 7 : Generalized Anxiety Score  Nervous, Anxious, on Edge 1 1 1 1   Control/stop worrying 1 1 1 1   Worry too much - different things 1 1 1 1   Trouble relaxing 0 0 0 1  Restless 0 1 1 1   Easily annoyed or irritable 0 0 0 0  Afraid - awful might happen 0 0 0 0  Total GAD 7 Score 3 4 4 5   Anxiety Difficulty Somewhat difficult Somewhat difficult Somewhat difficult Somewhat difficult   Pertinent labs & imaging results that were available during my care of the patient were reviewed by  me and considered in my medical decision making.  Assessment & Plan:  Nabilah was seen today for abdominal pain.  Diagnoses and all orders for this visit:  Left lower quadrant abdominal pain Will start medication as below for suspected diverticulitis. Discussed at length with patient monitoring for worsening symptoms. If she is not improved by Friday, she is to call for us  to complete further evaluation with CT. Will prescribe diflucan  as below for yeast infection with abx. Labs as below. Will communicate results to patient once available. Will await results to determine next steps. Pain well controlled with tylenol , can continue at home.  -     CMP14+EGFR -     CBC with Differential/Platelet -     amoxicillin -clavulanate (AUGMENTIN  XR) 1000-62.5 MG 12 hr tablet; Take 2 tablets by mouth 2 (two) times daily. -     fluconazole  (DIFLUCAN ) 150 MG tablet; Take 1 tablet (150 mg total) by mouth once for 1 dose. May take second dose 72 hours after first dose if symptoms remain   Continue all other maintenance medications.  Follow up plan: Return if symptoms worsen or fail to improve.   Continue healthy lifestyle choices, including diet (rich in fruits, vegetables, and lean proteins, and low in salt and simple carbohydrates) and exercise (at least 30 minutes of moderate physical activity daily).  Written and verbal instructions provided   The above assessment and management plan was discussed with the patient. The patient verbalized understanding of and has agreed to the management plan. Patient is aware to call the clinic if they develop any new symptoms or if symptoms persist or worsen. Patient is aware when to return to the clinic for a follow-up visit. Patient educated on when it is appropriate to go to the emergency department.   Jacqualyn Mates, DNP-FNP [facility] [address] [place], [place] [address] [phone]

## 2024-05-10 ENCOUNTER — Ambulatory Visit: Payer: Self-pay | Admitting: Family Medicine

## 2024-05-10 NOTE — Progress Notes (Signed)
 Slightly elevated BG and decreased BUN/Crt. Not concerning at this time. Recommend patient follow up if symptoms are not improving for further evaluation.

## 2024-06-22 ENCOUNTER — Ambulatory Visit: Payer: Medicare Other | Admitting: Nurse Practitioner

## 2024-06-22 NOTE — Progress Notes (Deleted)
 Subjective:    Patient ID: Rachael Allen, female    DOB: 1971/09/11, 53 y.o.   MRN: [id number]  Chief Complaint: medical management of chronic issues     HPI:  Rachael Allen is a 53 y.o. who identifies as a female who was assigned female at birth.   Social history: Lives with: husband Work history: disability   Comes in today for follow up of the following chronic medical issues:  1. Primary hypertension No c/o chest pain, sob or headache. Does not check blood pressure at home. BP Readings from Last 3 Encounters:  05/09/24 114/74  04/17/24 (!) 140/86  01/03/24 112/79     2. Intractable chronic migraine without aura and without status migrainosus Currently doing well. Has migraine 1x a quarter.  3. Diverticulosis Denies any recent flare ups  4. Recurrent major depressive disorder, in full remission (HCC) 5. Bipolar affective disorder, current episode mixed, current episode severity unspecified (HCC) Is on latuda  which is working well for her with no side effects.  ***    6. Cervical stenosis of spine Has chronic back pain but is on no pain medication at this time  7. Fibromyalgia Has pain daily. Some days are better then others. Pain increases with to much activity. Rest helps. Rates pain 7/10 today.   8. Morbid obesity (HCC) No recent weight changes  ***     New complaints: None today  Allergies  Allergen Reactions   Oxycodone -Acetaminophen  Itching    vomiting   Nitrofurantoin Other (See Comments)    SEVERE HEADACHE   Benadryl [Diphenhydramine Hcl]    Dilaudid [Hydromorphone Hcl]    Latex    Nsaids Nausea Only    In large doses   Vicodin Hale.Grieves ]    Outpatient Encounter Medications as of 06/22/2024  Medication Sig   albuterol  (VENTOLIN  HFA) 108 (90 Base) MCG/ACT inhaler Inhale 2 puffs into the lungs every 6 (six) hours as needed for wheezing or shortness of breath.   amoxicillin -clavulanate (AUGMENTIN  XR) 1000-62.5 MG 12  hr tablet Take 2 tablets by mouth 2 (two) times daily.   CVS B-12 500 MCG tablet Take 500 mcg by mouth daily.   hydrocortisone  1 % lotion Apply 1 Application topically 2 (two) times daily.   lisinopril  (ZESTRIL ) 20 MG tablet TAKE 1 TABLET DAILY   lurasidone  (LATUDA ) 40 MG TABS tablet Take 1 tablet (40 mg total) by mouth daily with breakfast.   metFORMIN  (GLUCOPHAGE -XR) 500 MG 24 hr tablet Take 1 tablet (500 mg total) by mouth 2 (two) times daily.   ondansetron  (ZOFRAN ) 4 MG tablet TAKE 1 TABLET BY MOUTH EVERY 8 HOURS AS NEEDED FOR NAUSEA AND VOMITING   tirzepatide  (ZEPBOUND ) 10 MG/0.5ML Pen Inject 10 mg into the skin once a week.   valACYclovir  (VALTREX ) 1000 MG tablet TAKE 2 TABLETS TWICE A DAY BY MOUTH FOR 1 DAY THEN AS NEEDED FOR FEVER BLISTERS   No facility-administered encounter medications on file as of 06/22/2024.    Past Surgical History:  Procedure Laterality Date   CESAREAN SECTION     TONSILLECTOMY AND ADENOIDECTOMY      Family History  Problem Relation Age of Onset   Breast cancer Mother 61   Hypertension Mother    Mental illness Father    Hypertension Sister       Controlled substance contract: n/a     Review of Systems  Constitutional:  Negative for diaphoresis.  Eyes:  Negative for pain.  Respiratory:  Negative for shortness of  breath.   Cardiovascular:  Negative for chest pain, palpitations and leg swelling.  Gastrointestinal:  Negative for abdominal pain.  Endocrine: Negative for polydipsia.  Skin:  Negative for rash.  Neurological:  Negative for dizziness, weakness and headaches.  Hematological:  Does not bruise/bleed easily.  All other systems reviewed and are negative.      Objective:   Physical Exam Vitals and nursing note reviewed.  Constitutional:      General: She is not in acute distress.    Appearance: Normal appearance. She is well-developed.  HENT:     Head: Normocephalic.     Right Ear: Tympanic membrane normal.     Left Ear:  Tympanic membrane normal.     Nose: Nose normal.     Mouth/Throat:     Mouth: Mucous membranes are moist.  Eyes:     Pupils: Pupils are equal, round, and reactive to light.  Neck:     Vascular: No carotid bruit or JVD.  Cardiovascular:     Rate and Rhythm: Normal rate and regular rhythm.     Heart sounds: Normal heart sounds.  Pulmonary:     Effort: Pulmonary effort is normal. No respiratory distress.     Breath sounds: Normal breath sounds. No wheezing or rales.  Chest:     Chest wall: No tenderness.  Abdominal:     General: Bowel sounds are normal. There is no distension or abdominal bruit.     Palpations: Abdomen is soft. There is no hepatomegaly, splenomegaly, mass or pulsatile mass.     Tenderness: There is no abdominal tenderness.  Musculoskeletal:        General: Normal range of motion.     Cervical back: Normal range of motion and neck supple.  Lymphadenopathy:     Cervical: No cervical adenopathy.  Skin:    General: Skin is warm and dry.  Neurological:     Mental Status: She is alert and oriented to person, place, and time.     Deep Tendon Reflexes: Reflexes are normal and symmetric.  Psychiatric:        Behavior: Behavior normal.        Thought Content: Thought content normal.        Judgment: Judgment normal.    There were no vitals taken for this visit.   Hgba1c 5.1%      Assessment & Plan:   Rachael Allen comes in today with chief complaint of No chief complaint on file.   Diagnosis and orders addressed:  1. Primary hypertension Low sodium diet - CBC with Differential/Platelet - CMP14+EGFR - lisinopril  (ZESTRIL ) 20 MG tablet; Take 1 tablet (20 mg total) by mouth daily.  Dispense: 90 tablet; Refill: 1 - CBC with Differential/Platelet - CMP14+EGFR - Lipid panel  2. Intractable chronic migraine without aura and without status migrainosus Avoid caffeine  3. Diverticulosis Watch diet to prevent flare up  4. Recurrent major depressive disorder,  in full remission (HCC) Stress management  5. Bipolar affective disorder, current episode mixed, current episode severity unspecified (HCC) Continue with psych  6. Cervical stenosis of spine  7. Morbid obesity (HCC) Discussed diet and exercise for person with BMI >25 Will recheck weight in 3-6 months Still waiting on wegovy  prescription which has been on back order  8. Fibromyalgia muscle pain Moist heat Depomedrol 80mg  IM Toradol  60mg  IM - Bayer DCA Hb A1c Waived - Lipid panel  9. PCOS (polycystic ovarian syndrome) - metFORMIN  (GLUCOPHAGE -XR) 500 MG 24 hr tablet; Take 1  tablet (500 mg total) by mouth 2 (two) times daily.  Dispense: 180 tablet; Refill: 1   Labs pending Health Maintenance reviewed Diet and exercise encouraged  Follow up plan: 6 months   Mary-Margaret Gladis, FNP

## 2024-06-26 ENCOUNTER — Encounter: Payer: Self-pay | Admitting: Nurse Practitioner

## 2024-06-28 ENCOUNTER — Ambulatory Visit: Admitting: Nurse Practitioner

## 2024-06-28 ENCOUNTER — Encounter: Payer: Self-pay | Admitting: Nurse Practitioner

## 2024-06-28 VITALS — BP 106/81 | HR 61 | Temp 97.9°F | Ht 65.0 in | Wt 260.0 lb

## 2024-06-28 DIAGNOSIS — G43719 Chronic migraine without aura, intractable, without status migrainosus: Secondary | ICD-10-CM

## 2024-06-28 DIAGNOSIS — M4802 Spinal stenosis, cervical region: Secondary | ICD-10-CM

## 2024-06-28 DIAGNOSIS — E119 Type 2 diabetes mellitus without complications: Secondary | ICD-10-CM

## 2024-06-28 DIAGNOSIS — R3915 Urgency of urination: Secondary | ICD-10-CM

## 2024-06-28 DIAGNOSIS — M797 Fibromyalgia: Secondary | ICD-10-CM

## 2024-06-28 DIAGNOSIS — K5790 Diverticulosis of intestine, part unspecified, without perforation or abscess without bleeding: Secondary | ICD-10-CM

## 2024-06-28 DIAGNOSIS — E282 Polycystic ovarian syndrome: Secondary | ICD-10-CM

## 2024-06-28 DIAGNOSIS — I10 Essential (primary) hypertension: Secondary | ICD-10-CM

## 2024-06-28 DIAGNOSIS — F3342 Major depressive disorder, recurrent, in full remission: Secondary | ICD-10-CM

## 2024-06-28 DIAGNOSIS — N907 Vulvar cyst: Secondary | ICD-10-CM

## 2024-06-28 DIAGNOSIS — Z7984 Long term (current) use of oral hypoglycemic drugs: Secondary | ICD-10-CM

## 2024-06-28 DIAGNOSIS — F3160 Bipolar disorder, current episode mixed, unspecified: Secondary | ICD-10-CM

## 2024-06-28 LAB — BAYER DCA HB A1C WAIVED: HB A1C (BAYER DCA - WAIVED): 5.2 % (ref 4.8–5.6)

## 2024-06-28 LAB — LIPID PANEL

## 2024-06-28 MED ORDER — LISINOPRIL 20 MG PO TABS: 20.0000 mg | ORAL_TABLET | Freq: Every day | ORAL | 1 refills | Status: DC

## 2024-06-28 MED ORDER — MIRABEGRON ER 25 MG PO TB24: 25.0000 mg | ORAL_TABLET | Freq: Every day | ORAL | 5 refills | Status: DC

## 2024-06-28 MED ORDER — LURASIDONE HCL 40 MG PO TABS: 40.0000 mg | ORAL_TABLET | Freq: Every day | ORAL | 1 refills | Status: DC

## 2024-06-28 MED ORDER — METFORMIN HCL ER 500 MG PO TB24: 500.0000 mg | ORAL_TABLET | Freq: Two times a day (BID) | ORAL | 1 refills | Status: DC

## 2024-06-28 NOTE — Patient Instructions (Signed)
 Pocket of Fluid in the Skin (Epidermoid Cyst): What to Know  An epidermoid cyst is a pocket of fluid that can form under your skin. It's filled with thick, oily substance that your skin glands make. These cysts occur anywhere on your body. They're usually harmless and don't cause problems unless they get inflamed or infected. What are the causes? An epidermoid cyst may be caused by: A blocked pore or hair follicle. An ingrown hair. This is a hair that curls and re-enters the skin instead of growing straight out of the skin. Skin irritation. Skin injuries. Some conditions that are passed from parent to child (inherited). Human papillomavirus (HPV). This is rare but can cause cysts on the bottom of the feet. Long-term (chronic) sun damage to the skin. What increases the risk? Having acne. Being female. Skin injuries. Being past puberty. Certain rare genetic disorders. What are the signs or symptoms? The only sign of this type of cyst may be a small, painless lump under the skin. When an epidermal cyst ruptures, it may become inflamed. Infections are rare, but symptoms may include: Redness. Inflammation. Tenderness. Warmth. Fever. A bad-smelling, grayish-white substance draining from the cyst. Pus draining from the cyst. How is this diagnosed? This condition is diagnosed with a physical exam. Sometimes, a tissue sample (biopsy) may need to be looked at under a microscope or tested for bacteria. You may be referred to a health care provider who specializes in skin care. This provider is called a dermatologist. How is this treated? If a cyst becomes inflamed, treatment may include: Opening and draining the cyst. Antibiotics. Steroid shots to lessen inflammation. Surgery to take out cysts that are large, painful, or could turn into cancer. Do not try to open or squeeze a cyst yourself. Follow these instructions at home: Medicines Take your medicines only as told. If you were given  antibiotics, take them as told. Do not stop taking them even if you start to feel better. General instructions Keep the area around your cyst clean and dry. Wear loose, dry clothing. Avoid touching your cyst. Check the area around your cyst every day for signs of infection. Check for: Redness, swelling, or pain. Fluid or blood. Warmth. Pus or a bad smell. Keep all follow-up visits to make sure the cyst isn't becoming uncomfortable or infected. Contact a health care provider if: You have any signs of infection. Your cyst doesn't get better or gets worse. You get a cyst that looks different from other cysts you've had. You have a fever. You have redness that spreads from the cyst. This information is not intended to replace advice given to you by your health care provider. Make sure you discuss any questions you have with your health care provider. Document Revised: 07/25/2023 Document Reviewed: 07/15/2023 Elsevier Patient Education  2024 [employer].

## 2024-06-28 NOTE — Progress Notes (Signed)
 Subjective:    Patient ID: Rachael Allen, female    DOB: 09/23/1971, 53 y.o.   MRN: [id number]  Chief Complaint: medical management of chronic issues     HPI:  Rachael Allen is a 53 y.o. who identifies as a female who was assigned female at birth.   Social history: Lives with: husband Work history: disability   Comes in today for follow up of the following chronic medical issues:  1. Primary hypertension No c/o chest pain, sob or headache. Does not check blood pressure at home. BP Readings from Last 3 Encounters:  05/09/24 114/74  04/17/24 (!) 140/86  01/03/24 112/79     2. Intractable chronic migraine without aura and without status migrainosus Currently doing well. Has migraine 1x a quarter.  3. Diverticulosis Denies any recent flare ups  4. Recurrent major depressive disorder, in full remission (HCC) 5. Bipolar affective disorder, current episode mixed, current episode severity unspecified (HCC) Is on latuda  which is working well for her with no side effects.     06/28/2024   12:23 PM 05/09/2024    8:39 AM 12/26/2023    3:37 PM  Depression screen PHQ 2/9  Decreased Interest 1  1  Down, Depressed, Hopeless 1 0 0  PHQ - 2 Score 2 0 1  Altered sleeping 1 0 0  Tired, decreased energy 1 1 1   Change in appetite 0 0 0  Feeling bad or failure about yourself  0 1 0  Trouble concentrating 0 0 1  Moving slowly or fidgety/restless 0 1 0  Suicidal thoughts 0 0 0  PHQ-9 Score 4 3 3   Difficult doing work/chores Somewhat difficult Somewhat difficult Somewhat difficult      06/28/2024   12:24 PM 05/09/2024    8:40 AM 12/26/2023    3:37 PM 10/21/2023    1:22 PM  GAD 7 : Generalized Anxiety Score  Nervous, Anxious, on Edge 1 1 1 1   Control/stop worrying 1 1 1 1   Worry too much - different things 1 1 1 1   Trouble relaxing 0 0 0 0  Restless 0 0 0 1  Easily annoyed or irritable 0 0 0 0  Afraid - awful might happen 0 0 0 0  Total GAD 7 Score 3 3 3 4   Anxiety Difficulty  Somewhat difficult Somewhat difficult Somewhat difficult Somewhat difficult        6. Cervical stenosis of spine Has chronic back pain but is on no pain medication at this time  7. Fibromyalgia Has pain daily. Some days are better then others. Pain increases with to much activity. Rest helps. Rates pain 7/10 today.   8. Morbid obesity (HCC) No recent weight changes  Wt Readings from Last 3 Encounters:  06/28/24 260 lb (117.9 kg)  05/09/24 267 lb 3.2 oz (121.2 kg)  04/17/24 271 lb (122.9 kg)   BMI Readings from Last 3 Encounters:  06/28/24 43.27 kg/m  05/09/24 44.46 kg/m  04/17/24 45.10 kg/m        New complaints: Nodule on left labia- just noticed it the other day. Nontender, no drainage. No change is size. Frequent voiding- worse at night - has been going on for months  Allergies  Allergen Reactions   Oxycodone -Acetaminophen  Itching    vomiting   Nitrofurantoin Other (See Comments)    SEVERE HEADACHE   Benadryl [Diphenhydramine Hcl]    Dilaudid [Hydromorphone Hcl]    Latex    Nsaids Nausea Only    In large  doses   Vicodin [Hydrocodone-Acetaminophen ]    Outpatient Encounter Medications as of 06/28/2024  Medication Sig   albuterol  (VENTOLIN  HFA) 108 (90 Base) MCG/ACT inhaler Inhale 2 puffs into the lungs every 6 (six) hours as needed for wheezing or shortness of breath.   amoxicillin -clavulanate (AUGMENTIN  XR) 1000-62.5 MG 12 hr tablet Take 2 tablets by mouth 2 (two) times daily.   [employer] B-12 500 MCG tablet Take 500 mcg by mouth daily.   hydrocortisone  1 % lotion Apply 1 Application topically 2 (two) times daily.   lisinopril  (ZESTRIL ) 20 MG tablet TAKE 1 TABLET DAILY   lurasidone  (LATUDA ) 40 MG TABS tablet Take 1 tablet (40 mg total) by mouth daily with breakfast.   metFORMIN  (GLUCOPHAGE -XR) 500 MG 24 hr tablet Take 1 tablet (500 mg total) by mouth 2 (two) times daily.   ondansetron  (ZOFRAN ) 4 MG tablet TAKE 1 TABLET BY MOUTH EVERY 8 HOURS AS NEEDED FOR  NAUSEA AND VOMITING   tirzepatide  (ZEPBOUND ) 10 MG/0.5ML Pen Inject 10 mg into the skin once a week.   valACYclovir  (VALTREX ) 1000 MG tablet TAKE 2 TABLETS TWICE A DAY BY MOUTH FOR 1 DAY THEN AS NEEDED FOR FEVER BLISTERS   No facility-administered encounter medications on file as of 06/28/2024.    Past Surgical History:  Procedure Laterality Date   CESAREAN SECTION     TONSILLECTOMY AND ADENOIDECTOMY      Family History  Problem Relation Age of Onset   Breast cancer Mother 87   Hypertension Mother    Mental illness Father    Hypertension Sister       Controlled substance contract: n/a     Review of Systems  Constitutional:  Negative for diaphoresis.  Eyes:  Negative for pain.  Respiratory:  Negative for shortness of breath.   Cardiovascular:  Negative for chest pain, palpitations and leg swelling.  Gastrointestinal:  Negative for abdominal pain.  Endocrine: Negative for polydipsia.  Skin:  Negative for rash.  Neurological:  Negative for dizziness, weakness and headaches.  Hematological:  Does not bruise/bleed easily.  All other systems reviewed and are negative.      Objective:   Physical Exam Vitals and nursing note reviewed.  Constitutional:      General: She is not in acute distress.    Appearance: Normal appearance. She is well-developed.  HENT:     Head: Normocephalic.     Right Ear: Tympanic membrane normal.     Left Ear: Tympanic membrane normal.     Nose: Nose normal.     Mouth/Throat:     Mouth: Mucous membranes are moist.  Eyes:     Pupils: Pupils are equal, round, and reactive to light.  Neck:     Vascular: No carotid bruit or JVD.  Cardiovascular:     Rate and Rhythm: Normal rate and regular rhythm.     Heart sounds: Normal heart sounds.  Pulmonary:     Effort: Pulmonary effort is normal. No respiratory distress.     Breath sounds: Normal breath sounds. No wheezing or rales.  Chest:     Chest wall: No tenderness.  Abdominal:     General:  Bowel sounds are normal. There is no distension or abdominal bruit.     Palpations: Abdomen is soft. There is no hepatomegaly, splenomegaly, mass or pulsatile mass.     Tenderness: There is no abdominal tenderness.  Musculoskeletal:        General: Normal range of motion.     Cervical back: Normal  range of motion and neck supple.  Lymphadenopathy:     Cervical: No cervical adenopathy.  Skin:    General: Skin is warm and dry.     Comments: 2cm sebaceous cyst left labia  Neurological:     Mental Status: She is alert and oriented to person, place, and time.     Deep Tendon Reflexes: Reflexes are normal and symmetric.  Psychiatric:        Behavior: Behavior normal.        Thought Content: Thought content normal.        Judgment: Judgment normal.    BP 106/81   Pulse 61   Temp 97.9 F (36.6 C) (Temporal)   Ht 5' 5 (1.651 m)   Wt 260 lb (117.9 kg)   SpO2 97%   BMI 43.27 kg/m     Hgba1c 5.2%  I & D  Date/Time: 06/28/2024 12:49 PM  Performed by: Rachael Mustard, FNP Authorized by: Rachael Mustard, FNP   Consent:    Consent obtained:  Verbal   Consent given by:  Patient   Risks, benefits, and alternatives were discussed: not applicable     Risks discussed:  Infection and bleeding   Alternatives discussed:  No treatment Location:    Type:  Cyst   Location:  Anogenital Pre-procedure details:    Skin preparation:  Povidone-iodine Sedation:    Sedation type:  None Anesthesia:    Anesthesia method:  None Procedure type:    Complexity:  Simple Procedure details:    Needle aspiration: no     Incision types:  Stab incision   Incision depth:  Subcutaneous   Scalpel size: 23g needle.   Drainage amount:  Moderate   Wound treatment:  Wound left open   Packing materials:  None Post-procedure details:    Procedure completion:  Tolerated well, no immediate complications        Assessment & Plan:   Rachael Allen comes in today with chief complaint of  medical management of chronic issues    Diagnosis and orders addressed:  1. Primary hypertension Low sodium diet - CBC with Differential/Platelet - CMP14+EGFR - lisinopril  (ZESTRIL ) 20 MG tablet; Take 1 tablet (20 mg total) by mouth daily.  Dispense: 90 tablet; Refill: 1 - CBC with Differential/Platelet - CMP14+EGFR - Lipid panel  2. Intractable chronic migraine without aura and without status migrainosus Avoid caffeine  3. Diverticulosis Watch diet to prevent flare up  4. Recurrent major depressive disorder, in full remission (HCC) Stress management  5. Bipolar affective disorder, current episode mixed, current episode severity unspecified (HCC) Continue with psych  6. Cervical stenosis of spine  7. Morbid obesity (HCC) Discussed diet and exercise for person with BMI >25 Will recheck weight in 3-6 months Still waiting on wegovy  prescription which has been on back order  8. Fibromyalgia muscle pain Moist heat Depomedrol 80mg  IM Toradol  60mg  IM - Bayer DCA Hb A1c Waived - Lipid panel  9. PCOS (polycystic ovarian syndrome) - metFORMIN  (GLUCOPHAGE -XR) 500 MG 24 hr tablet; Take 1 tablet (500 mg total) by mouth 2 (two) times daily.  Dispense: 180 tablet; Refill: 1  10. Sebaceaous cyst left labia Soak in epsom salt  11. Urinary urgency Start myrbetriq   Labs pending Health Maintenance reviewed Diet and exercise encouraged  Follow up plan: 6 months   Mary-Margaret Gladis, FNP

## 2024-06-29 ENCOUNTER — Emergency Department (HOSPITAL_COMMUNITY)

## 2024-06-29 ENCOUNTER — Emergency Department (HOSPITAL_COMMUNITY)
Admission: EM | Admit: 2024-06-29 | Discharge: 2024-06-29 | Disposition: A | Discharge status: Home / Self Care | Attending: Emergency Medicine | Admitting: Emergency Medicine

## 2024-06-29 ENCOUNTER — Other Ambulatory Visit: Payer: Self-pay

## 2024-06-29 ENCOUNTER — Ambulatory Visit: Payer: Self-pay | Admitting: Nurse Practitioner

## 2024-06-29 ENCOUNTER — Encounter (HOSPITAL_COMMUNITY): Payer: Self-pay | Admitting: *Deleted

## 2024-06-29 DIAGNOSIS — Z91040 Latex allergy status: Secondary | ICD-10-CM | POA: Diagnosis not present

## 2024-06-29 DIAGNOSIS — K5732 Diverticulitis of large intestine without perforation or abscess without bleeding: Secondary | ICD-10-CM | POA: Insufficient documentation

## 2024-06-29 DIAGNOSIS — R1032 Left lower quadrant pain: Secondary | ICD-10-CM | POA: Diagnosis present

## 2024-06-29 DIAGNOSIS — K5792 Diverticulitis of intestine, part unspecified, without perforation or abscess without bleeding: Secondary | ICD-10-CM

## 2024-06-29 HISTORY — DX: Diverticulitis of intestine, part unspecified, without perforation or abscess without bleeding: K57.92

## 2024-06-29 LAB — URINALYSIS, ROUTINE W REFLEX MICROSCOPIC
Bilirubin Urine: NEGATIVE
Glucose, UA: NEGATIVE mg/dL
Hgb urine dipstick: NEGATIVE
Ketones, ur: NEGATIVE mg/dL
Leukocytes,Ua: NEGATIVE
Nitrite: NEGATIVE
Protein, ur: NEGATIVE mg/dL
Specific Gravity, Urine: 1.013 (ref 1.005–1.030)
pH: 5 (ref 5.0–8.0)

## 2024-06-29 LAB — CBC WITH DIFFERENTIAL/PLATELET
Abs Immature Granulocytes: 0.04 K/uL (ref 0.00–0.07)
Basophils Absolute: 0.1 x10E3/uL (ref 0.0–0.2)
Basophils Absolute: 0.1 K/uL (ref 0.0–0.1)
Basophils Relative: 1 %
Basos: 1 %
EOS (ABSOLUTE): 0.3 x10E3/uL (ref 0.0–0.4)
Eos: 4 %
Eosinophils Absolute: 0.3 K/uL (ref 0.0–0.5)
Eosinophils Relative: 2 %
HCT: 39.5 % (ref 36.0–46.0)
Hematocrit: 40.5 % (ref 34.0–46.6)
Hemoglobin: 13.8 g/dL (ref 11.1–15.9)
Hemoglobin: 13.6 g/dL (ref 12.0–15.0)
Immature Grans (Abs): 0.1 x10E3/uL (ref 0.0–0.1)
Immature Granulocytes: 1 %
Immature Granulocytes: 0 %
Lymphocytes Absolute: 1.8 x10E3/uL (ref 0.7–3.1)
Lymphocytes Relative: 18 %
Lymphs Abs: 1.9 K/uL (ref 0.7–4.0)
Lymphs: 24 %
MCH: 28.9 pg (ref 26.6–33.0)
MCH: 28.9 pg (ref 26.0–34.0)
MCHC: 34.1 g/dL (ref 31.5–35.7)
MCHC: 34.4 g/dL (ref 30.0–36.0)
MCV: 85 fL (ref 79–97)
MCV: 84 fL (ref 80.0–100.0)
Monocytes Absolute: 0.5 x10E3/uL (ref 0.1–0.9)
Monocytes Absolute: 0.7 K/uL (ref 0.1–1.0)
Monocytes Relative: 7 %
Monocytes: 7 %
Neutro Abs: 7.8 K/uL — ABNORMAL HIGH (ref 1.7–7.7)
Neutrophils Absolute: 4.7 x10E3/uL (ref 1.4–7.0)
Neutrophils Relative %: 72 %
Neutrophils: 63 %
Platelets: 269 x10E3/uL (ref 150–450)
Platelets: 264 K/uL (ref 150–400)
RBC: 4.78 x10E6/uL (ref 3.77–5.28)
RBC: 4.7 MIL/uL (ref 3.87–5.11)
RDW: 14.4 % (ref 11.7–15.4)
RDW: 14 % (ref 11.5–15.5)
WBC: 7.5 x10E3/uL (ref 3.4–10.8)
WBC: 10.9 K/uL — ABNORMAL HIGH (ref 4.0–10.5)
nRBC: 0 % (ref 0.0–0.2)

## 2024-06-29 LAB — MICROALBUMIN / CREATININE URINE RATIO
Creatinine, Urine: 29.5 mg/dL
Microalb/Creat Ratio: <10 mg/g{creat} (ref 0–29)
Microalbumin, Urine: <3 ug/mL

## 2024-06-29 LAB — CMP14+EGFR
ALT: 21 IU/L (ref 0–32)
AST: 16 IU/L (ref 0–40)
Albumin: 4.4 g/dL (ref 3.8–4.9)
Alkaline Phosphatase: 57 IU/L (ref 44–121)
BUN/Creatinine Ratio: 13 (ref 9–23)
BUN: 10 mg/dL (ref 6–24)
Bilirubin Total: 0.4 mg/dL (ref 0.0–1.2)
CO2: 20 mmol/L (ref 20–29)
Calcium: 9.6 mg/dL (ref 8.7–10.2)
Chloride: 103 mmol/L (ref 96–106)
Creatinine, Ser: 0.79 mg/dL (ref 0.57–1.00)
Globulin, Total: 2.5 g/dL (ref 1.5–4.5)
Glucose: 91 mg/dL (ref 70–99)
Potassium: 4.2 mmol/L (ref 3.5–5.2)
Sodium: 140 mmol/L (ref 134–144)
Total Protein: 6.9 g/dL (ref 6.0–8.5)
eGFR: 89 mL/min/1.73 (ref >59)

## 2024-06-29 LAB — LIPID PANEL
Chol/HDL Ratio: 4.2 ratio (ref 0.0–4.4)
Cholesterol, Total: 138 mg/dL (ref 100–199)
HDL: 33 mg/dL — ABNORMAL LOW (ref >39)
LDL Chol Calc (NIH): 80 mg/dL (ref 0–99)
Triglycerides: 140 mg/dL (ref 0–149)
VLDL Cholesterol Cal: 25 mg/dL (ref 5–40)

## 2024-06-29 LAB — VITAMIN B12: Vitamin B-12: 760 pg/mL (ref 232–1245)

## 2024-06-29 LAB — COMPREHENSIVE METABOLIC PANEL WITH GFR
ALT: 21 U/L (ref 0–44)
AST: 13 U/L — ABNORMAL LOW (ref 15–41)
Albumin: 3.9 g/dL (ref 3.5–5.0)
Alkaline Phosphatase: 50 U/L (ref 38–126)
Anion gap: 15 (ref 5–15)
BUN: 10 mg/dL (ref 6–20)
CO2: 21 mmol/L — ABNORMAL LOW (ref 22–32)
Calcium: 9 mg/dL (ref 8.9–10.3)
Chloride: 102 mmol/L (ref 98–111)
Creatinine, Ser: 0.7 mg/dL (ref 0.44–1.00)
GFR, Estimated: >60 mL/min (ref >60)
Glucose, Bld: 115 mg/dL — ABNORMAL HIGH (ref 70–99)
Potassium: 3.6 mmol/L (ref 3.5–5.1)
Sodium: 138 mmol/L (ref 135–145)
Total Bilirubin: 0.6 mg/dL (ref 0.0–1.2)
Total Protein: 7.4 g/dL (ref 6.5–8.1)

## 2024-06-29 LAB — LIPASE, BLOOD: Lipase: 32 U/L (ref 11–51)

## 2024-06-29 MED ORDER — DICYCLOMINE HCL 20 MG PO TABS: 20.0000 mg | ORAL_TABLET | Freq: Two times a day (BID) | ORAL | 0 refills | Status: DC

## 2024-06-29 MED ORDER — AMOXICILLIN-POT CLAVULANATE 875-125 MG PO TABS
1.0000 | ORAL_TABLET | Freq: Once | ORAL | Status: AC
  Administered 2024-06-29: 1 via ORAL
  Filled 2024-06-29: qty 1

## 2024-06-29 MED ORDER — IOHEXOL 300 MG/ML  SOLN
100.0000 mL | Freq: Once | INTRAMUSCULAR | Status: AC | PRN
  Administered 2024-06-29: 100 mL via INTRAVENOUS

## 2024-06-29 MED ORDER — SODIUM CHLORIDE 0.9 % IV BOLUS: 1000.0000 mL | Freq: Once | INTRAVENOUS | Status: DC

## 2024-06-29 MED ORDER — DICYCLOMINE HCL 10 MG/ML IM SOLN
20.0000 mg | Freq: Once | INTRAMUSCULAR | Status: AC
  Administered 2024-06-29: 20 mg via INTRAMUSCULAR
  Filled 2024-06-29: qty 2

## 2024-06-29 MED ORDER — ONDANSETRON 4 MG PO TBDP: 4.0000 mg | ORAL_TABLET | Freq: Three times a day (TID) | ORAL | 0 refills | Status: DC | PRN

## 2024-06-29 MED ORDER — FENTANYL CITRATE (PF) 100 MCG/2ML IJ SOLN
50.0000 ug | Freq: Once | INTRAMUSCULAR | Status: DC
  Filled 2024-06-29: qty 2

## 2024-06-29 MED ORDER — AMOXICILLIN-POT CLAVULANATE 875-125 MG PO TABS
1.0000 | ORAL_TABLET | Freq: Two times a day (BID) | ORAL | 0 refills | Status: AC
Start: 2024-06-29 — End: 2024-07-09

## 2024-06-29 NOTE — ED Triage Notes (Signed)
 Pt with abd pain with diarrhea that started this morning. Denies any N/V.

## 2024-06-29 NOTE — ED Provider Notes (Signed)
 [place] EMERGENCY DEPARTMENT AT [facility] Provider Note   CSN: [id number] Arrival date & time: 06/29/24  1601     Patient presents with: Abdominal Pain   Rachael Allen is a 53 y.o. female.   Patient is a 53 year old female who presents emerged from with a chief complaint of left lower quadrant abdominal pain and diarrhea.  She notes her symptoms have been ongoing since this morning.  She notes that she does have a history of diverticulitis.  She denies any associated nausea or vomiting.  There has been no associated fever or chills.  She denies any chest pain, shortness of breath.  She denies any dysuria or hematuria.   Abdominal Pain      Prior to Admission medications   Medication Sig Start Date End Date Taking? Authorizing Provider  albuterol  (VENTOLIN  HFA) 108 (90 Base) MCG/ACT inhaler Inhale 2 puffs into the lungs every 6 (six) hours as needed for wheezing or shortness of breath. 10/31/23   Gladis Mary-Margaret, FNP  [facility]S B-12 500 MCG tablet Take 500 mcg by mouth daily. 12/11/19   [provider]  hydrocortisone  1 % lotion Apply 1 Application topically 2 (two) times daily. 06/25/22   Ijaola, Onyeje M, NP  lisinopril  (ZESTRIL ) 20 MG tablet Take 1 tablet (20 mg total) by mouth daily. 06/28/24   Gladis Mustard, FNP  lurasidone  (LATUDA ) 40 MG TABS tablet Take 1 tablet (40 mg total) by mouth daily with breakfast. 06/28/24   Gladis, Mary-Margaret, FNP  metFORMIN  (GLUCOPHAGE -XR) 500 MG 24 hr tablet Take 1 tablet (500 mg total) by mouth 2 (two) times daily. 06/28/24   Gladis Mustard, FNP  mirabegron  ER (MYRBETRIQ ) 25 MG TB24 tablet Take 1 tablet (25 mg total) by mouth daily. 06/28/24   Gladis, Mary-Margaret, FNP  ondansetron  (ZOFRAN ) 4 MG tablet TAKE 1 TABLET BY MOUTH EVERY 8 HOURS AS NEEDED FOR NAUSEA AND VOMITING 01/30/24   Joesph Annabella HERO, FNP  tirzepatide  (ZEPBOUND ) 10 MG/0.5ML Pen Inject 10 mg into the skin once a week. 01/05/24   Gladis,  Mary-Margaret, FNP  valACYclovir  (VALTREX ) 1000 MG tablet TAKE 2 TABLETS TWICE A DAY BY MOUTH FOR 1 DAY THEN AS NEEDED FOR FEVER BLISTERS 04/20/24   Gladis, Mary-Margaret, FNP    Allergies: Oxycodone -acetaminophen , Nitrofurantoin, Benadryl [diphenhydramine hcl], Dilaudid [hydromorphone hcl], Latex, Nsaids, and Vicodin [hydrocodone-acetaminophen ]    Review of Systems  Gastrointestinal:  Positive for abdominal pain.  All other systems reviewed and are negative.   Updated Vital Signs BP (!) 140/86 (BP Location: Right Arm)   Pulse 81   Temp 98.3 F (36.8 C)   Resp 16   Ht 5' 5 (1.651 m)   Wt 113.4 kg   SpO2 98%   BMI 41.60 kg/m   Physical Exam Vitals and nursing note reviewed.  Constitutional:      Appearance: Normal appearance.  HENT:     Head: Normocephalic and atraumatic.     Nose: Nose normal.     Mouth/Throat:     Mouth: Mucous membranes are moist.  Eyes:     Extraocular Movements: Extraocular movements intact.     Conjunctiva/sclera: Conjunctivae normal.     Pupils: Pupils are equal, round, and reactive to light.  Cardiovascular:     Rate and Rhythm: Normal rate and regular rhythm.     Pulses: Normal pulses.     Heart sounds: Normal heart sounds. No murmur heard.    No gallop.  Pulmonary:     Effort: Pulmonary effort is  normal. No respiratory distress.     Breath sounds: Normal breath sounds. No stridor. No wheezing, rhonchi or rales.  Abdominal:     General: Abdomen is flat. Bowel sounds are normal. There is no distension. There are no signs of injury.     Palpations: Abdomen is soft. There is no mass.     Tenderness: There is abdominal tenderness in the left lower quadrant. There is no guarding. Negative signs include Murphy's sign and McBurney's sign.     Hernia: No hernia is present.  Musculoskeletal:        General: Normal range of motion.     Cervical back: Normal range of motion and neck supple.  Skin:    General: Skin is warm and dry.  Neurological:      General: No focal deficit present.     Mental Status: She is alert and oriented to person, place, and time. Mental status is at baseline.  Psychiatric:        Mood and Affect: Mood normal.        Behavior: Behavior normal.        Thought Content: Thought content normal.        Judgment: Judgment normal.     (all labs ordered are listed, but only abnormal results are displayed) Labs Reviewed  COMPREHENSIVE METABOLIC PANEL WITH GFR - Abnormal; Notable for the following components:      Result Value   CO2 21 (*)    Glucose, Bld 115 (*)    AST 13 (*)    All other components within normal limits  CBC WITH DIFFERENTIAL/PLATELET - Abnormal; Notable for the following components:   WBC 10.9 (*)    Neutro Abs 7.8 (*)    All other components within normal limits  LIPASE, BLOOD  URINALYSIS, ROUTINE W REFLEX MICROSCOPIC    EKG: None  Radiology: No results found.   Procedures   Medications Ordered in the ED  fentaNYL  (SUBLIMAZE ) injection 50 mcg (50 mcg Intravenous Patient Refused/Not Given 06/29/24 1641)  iohexol  (OMNIPAQUE ) 300 MG/ML solution 100 mL (has no administration in time range)  dicyclomine  (BENTYL ) injection 20 mg (20 mg Intramuscular Given 06/29/24 1644)  sodium chloride  0.9 % bolus 1,000 mL (1,000 mLs Intravenous Bolus 06/29/24 1648)                                    Medical Decision Making Amount and/or Complexity of Data Reviewed Labs: ordered. Radiology: ordered.  Risk Prescription drug management.   This patient presents to the ED for concern of abdominal pain, diarrhea differential diagnosis includes acute appendicitis, cholecystitis, obstruction, diverticulitis, ovarian torsion cyst, PID, tubing abscess, pyelonephritis, kidney stone    Additional history obtained:  Additional history obtained from medical records External records from outside source obtained and reviewed including medical records   Lab Tests:  I Ordered, and personally interpreted  labs.  The pertinent results include: Mild leukocytosis, no anemia, normal kidney function liver function, normal electrolytes, unremarkable urinalysis, negative lipase   Imaging Studies ordered:  I ordered imaging studies including CT scan abdomen pelvis I independently visualized and interpreted imaging which showed acute uncomplicated diverticulitis I agree with the radiologist interpretation   Medicines ordered and prescription drug management:  I ordered medication including Augmentin , Bentyl , IV fluids for diverticulitis Reevaluation of the patient after these medicines showed that the patient improved I have reviewed the patients home medicines and have made  adjustments as needed   Problem List / ED Course:  Patient is doing well at this time and is stable for discharge home.  Discussed with patient CT scan findings are consistent with acute uncomplicated diverticulitis.  She has stable vital signs with no indication for sepsis.  Blood work is otherwise been unremarkable at this point.  She is tolerating p.o. intake.  Do not suspect that admission is warranted at this time.  No other acute surgical process was noted on CT scan of abdomen and pelvis.  The need for close follow-up with her primary care doctor and gastroenterology was discussed.  Strict turn precautions were provided as well for any new or worsening symptoms.  Patient voiced understanding and had no additional questions.   Social Determinants of Health:  None        Final diagnoses:  None    ED Discharge Orders     None          Daralene Lonni JONETTA DEVONNA 06/29/24 1840    Freddi Hamilton, MD 06/30/24 [phone]

## 2024-06-29 NOTE — Discharge Instructions (Signed)
 Please take all antibiotics as directed.  Follow-up closely with your primary care doctor and gastroenterologist on an outpatient basis.  Return to emergency department immediately for any new or worsening symptoms.

## 2024-07-03 ENCOUNTER — Encounter: Payer: Self-pay | Admitting: Nurse Practitioner

## 2024-07-03 MED ORDER — FLUCONAZOLE 150 MG PO TABS: 150.0000 mg | ORAL_TABLET | Freq: Once | ORAL | 0 refills | Status: AC

## 2024-07-05 ENCOUNTER — Ambulatory Visit: Admitting: Podiatry

## 2024-07-13 ENCOUNTER — Ambulatory Visit: Admitting: Nurse Practitioner

## 2024-07-17 ENCOUNTER — Ambulatory Visit: Admitting: Nurse Practitioner

## 2024-07-17 ENCOUNTER — Ambulatory Visit: Admitting: Podiatry

## 2024-07-17 ENCOUNTER — Encounter: Payer: Self-pay | Admitting: Nurse Practitioner

## 2024-07-17 VITALS — BP 132/81 | HR 96 | Temp 98.0°F | Ht 65.0 in | Wt 262.0 lb

## 2024-07-17 DIAGNOSIS — K5732 Diverticulitis of large intestine without perforation or abscess without bleeding: Secondary | ICD-10-CM | POA: Diagnosis not present

## 2024-07-17 DIAGNOSIS — Z09 Encounter for follow-up examination after completed treatment for conditions other than malignant neoplasm: Secondary | ICD-10-CM | POA: Diagnosis not present

## 2024-07-17 DIAGNOSIS — B3731 Acute candidiasis of vulva and vagina: Secondary | ICD-10-CM | POA: Diagnosis not present

## 2024-07-17 MED ORDER — FLUCONAZOLE 150 MG PO TABS: ORAL_TABLET | ORAL | 0 refills | Status: DC

## 2024-07-17 NOTE — Patient Instructions (Signed)
 Diverticulitis  Diverticulitis is when small pouches in your colon get infected or swollen. This causes pain in your belly (abdomen) and watery poop (diarrhea). The small pouches are called diverticula. They may form if you have a condition called diverticulosis. What are the causes? You may get this condition if poop (stool) gets trapped in the pouches in your colon. The poop lets germs (bacteria) grow. This causes an infection. What increases the risk? You are more likely to get this condition if you have small pouches in your colon. You are also more likely to get it if: You are overweight or very overweight (obese). You do not exercise enough. You drink alcohol. You smoke. You eat a lot of red meat, like beef, pork, or lamb. You do not eat enough fiber. You are older than 54 years of age. What are the signs or symptoms? Pain in your belly. Pain is often on the left side, but it may be felt in other spots too. Fever and chills. Feeling like you may vomit. Vomiting. Having cramps. Feeling full. Changes in how often you poop. Blood in your poop. How is this treated? Most cases are treated at home. You may be told to: Take over-the-counter pain medicines. Only eat and drink clear liquids. Take antibiotics. Rest. Very bad cases may need to be treated at a hospital. Treatment may include: Not eating or drinking. Taking pain medicines. Getting antibiotics through an IV tube. Getting fluid and food through an IV tube. Having surgery. When you are feeling better, you may need to have a test to look at your colon (colonoscopy). Follow these instructions at home: Medicines Take over-the-counter and prescription medicines only as told by your doctor. These include: Fiber pills. Probiotics. Medicines to make your poop soft (stool softeners). If you were prescribed antibiotics, take them as told by your doctor. Do not stop taking them even if you start to feel better. Ask your  doctor if you should avoid driving or using machines while you are taking your medicine. Eating and drinking  Follow the diet told by your doctor. You may need to only eat and drink liquids. When you feel better, you may be able to eat more foods. You may also be told to eat a lot of fiber. Fiber helps you poop. Foods with fiber include berries, beans, lentils, and green vegetables. Try not to eat red meat. General instructions Do not smoke or use any products that contain nicotine or tobacco. If you need help quitting, ask your doctor. Exercise 3 or more times a week. Try to go for 30 minutes each time. Exercise enough to sweat and make your heart beat faster. Contact a doctor if: Your pain gets worse. You are not pooping like normal. Your symptoms do not get better. Your symptoms get worse very fast. You have a fever. You vomit more than one time. You have poop that is: Bloody. Black. Tarry. This information is not intended to replace advice given to you by your health care provider. Make sure you discuss any questions you have with your health care provider. Document Revised: 08/26/2022 Document Reviewed: 08/26/2022 Elsevier Patient Education  2024 [employer].

## 2024-07-17 NOTE — Progress Notes (Signed)
   Subjective:    Patient ID: Rachael Allen, female    DOB: 22-Feb-1971, 53 y.o.   MRN: [id number]   Chief Complaint: Hospitalization Follow-up   HPI  Patient went to the ED on 06/29/24 with c/o abdominal pain. SHe was dx with diverticulitis. She was given zofran  and amoxicillin . She is feeling better. No abdominal pain. Has slight yeast infection from antibiotics.  Patient Active Problem List   Diagnosis Date Noted   Pain in left foot 02/11/2023   Lumbar radiculopathy 10/18/2022   Low back pain 09/02/2022   Mass of joint of right hip 07/28/2022   Primary hypertension 08/10/2021   Type 2 diabetes mellitus without complication, without long-term current use of insulin (HCC) 08/10/2021   Cervical stenosis of spine 07/03/2020   Diverticulosis 06/29/2017   Morbid obesity (HCC) 03/24/2016   OSA on CPAP 03/24/2016   Benign paroxysmal positional vertigo due to bilateral vestibular disorder 02/10/2016   Bipolar disorder (HCC) 01/26/2016   Depression 05/09/2014   Narcolepsy 05/09/2014   IUD (intrauterine device) in place 04/04/2014   Migraines 04/19/2013   Fibromyalgia muscle pain 03/07/2013   PCOS (polycystic ovarian syndrome) 03/07/2013   Fibrocystic breast 01/08/2013        Review of Systems  Constitutional:  Negative for diaphoresis.  Eyes:  Negative for pain.  Respiratory:  Negative for shortness of breath.   Cardiovascular:  Negative for chest pain, palpitations and leg swelling.  Gastrointestinal:  Negative for abdominal pain.  Endocrine: Negative for polydipsia.  Skin:  Negative for rash.  Neurological:  Negative for dizziness, weakness and headaches.  Hematological:  Does not bruise/bleed easily.  All other systems reviewed and are negative.      Objective:   Physical Exam Constitutional:      Appearance: Normal appearance. She is obese.  Cardiovascular:     Rate and Rhythm: Normal rate and regular rhythm.     Heart sounds: Normal heart sounds.  Pulmonary:      Effort: Pulmonary effort is normal.     Breath sounds: Normal breath sounds.  Abdominal:     General: Bowel sounds are normal.     Palpations: Abdomen is soft.     Tenderness: There is no abdominal tenderness.  Skin:    General: Skin is warm.  Neurological:     General: No focal deficit present.     Mental Status: She is alert and oriented to person, place, and time.     BP 132/81   Pulse 96   Temp 98 F (36.7 C) (Temporal)   Ht 5' 5 (1.651 m)   Wt 262 lb (118.8 kg)   SpO2 96%   BMI 43.60 kg/m        Assessment & Plan:   Rachael Allen in today with chief complaint of Hospitalization Follow-up   1. Diverticulitis of large intestine without perforation or abscess without bleeding (Primary) Watch diet to prevent flare up  2. Vaginal candidiasis Diflucan  sent to pharmacy  3. Hospital discharge follow-up Hospital records reviewed    The above assessment and management plan was discussed with the patient. The patient verbalized understanding of and has agreed to the management plan. Patient is aware to call the clinic if symptoms persist or worsen. Patient is aware when to return to the clinic for a follow-up visit. Patient educated on when it is appropriate to go to the emergency department.   Mary-Margaret Gladis, FNP

## 2024-07-26 ENCOUNTER — Ambulatory Visit

## 2024-07-31 ENCOUNTER — Ambulatory Visit: Admitting: Podiatry

## 2024-08-02 ENCOUNTER — Ambulatory Visit (INDEPENDENT_AMBULATORY_CARE_PROVIDER_SITE_OTHER): Admitting: Family

## 2024-08-02 ENCOUNTER — Encounter: Payer: Self-pay | Admitting: Family

## 2024-08-02 VITALS — BP 126/85 | HR 64 | Temp 97.3°F | Ht 65.0 in | Wt 266.0 lb

## 2024-08-02 DIAGNOSIS — M25551 Pain in right hip: Secondary | ICD-10-CM

## 2024-08-02 DIAGNOSIS — M545 Low back pain, unspecified: Secondary | ICD-10-CM

## 2024-08-02 DIAGNOSIS — M797 Fibromyalgia: Secondary | ICD-10-CM | POA: Diagnosis not present

## 2024-08-02 DIAGNOSIS — M25552 Pain in left hip: Secondary | ICD-10-CM

## 2024-08-02 MED ORDER — METHYLPREDNISOLONE ACETATE 80 MG/ML IJ SUSP
80.0000 mg | Freq: Once | INTRAMUSCULAR | Status: AC
  Administered 2024-08-02: 80 mg via INTRAMUSCULAR

## 2024-08-02 MED ORDER — BACLOFEN 10 MG PO TABS
10.0000 mg | ORAL_TABLET | Freq: Three times a day (TID) | ORAL | 0 refills | Status: DC
Start: 2024-08-02 — End: 2024-09-06

## 2024-08-02 MED ORDER — KETOROLAC TROMETHAMINE 60 MG/2ML IM SOLN
60.0000 mg | Freq: Once | INTRAMUSCULAR | Status: AC
  Administered 2024-08-02: 60 mg via INTRAMUSCULAR

## 2024-08-02 NOTE — Progress Notes (Signed)
 Subjective:    Patient ID: Rachael Allen, female    DOB: [birth year], 52 y.o.   MRN: [id number]  Chief Complaint  Patient presents with   Back Pain    Both hips and legs x 2 weeks no injury    PT presents to the office today with lower back and bilateral hip pain. She has a fibromyalgia and reports flare up of these.  She is a controlled DM. Her last A1C was 5.2.  Back Pain This is a recurrent problem. The current episode started 1 to 4 weeks ago. The problem occurs intermittently. The problem has been waxing and waning since onset. The pain is present in the lumbar spine. The quality of the pain is described as aching. The pain is at a severity of 7/10. The pain is moderate. The symptoms are aggravated by standing. Associated symptoms include numbness. Risk factors include obesity. She has tried bed rest (tylenol  and massage) for the symptoms. The treatment provided mild relief.      Review of Systems  Musculoskeletal:  Positive for back pain.  Neurological:  Positive for numbness.  All other systems reviewed and are negative.   Social History   Socioeconomic History   Marital status: Married    Spouse name: Not on file   Number of children: Not on file   Years of education: Not on file   Highest education level: Associate degree: academic program  Occupational History   Not on file  Tobacco Use   Smoking status: Never   Smokeless tobacco: Never  Vaping Use   Vaping status: Not on file  Substance and Sexual Activity   Alcohol use: No   Drug use: No   Sexual activity: Yes    Birth control/protection: I.U.D.  Other Topics Concern   Not on file  Social History Narrative   Not on file   Social Drivers of Health   Financial Resource Strain: Patient Declined (06/21/2024)   Overall Financial Resource Strain (CARDIA)    Difficulty of Paying Living Expenses: Patient declined  Recent Concern: Financial Resource Strain - Medium Risk (06/04/2024)   Received from Rachael Allen   Overall Financial Resource Strain (CARDIA)    Difficulty of Paying Living Expenses: Somewhat hard  Food Insecurity: Patient Declined (06/21/2024)   Hunger Vital Sign    Worried About Running Out of Food in the Last Year: Patient declined    Ran Out of Food in the Last Year: Patient declined  Transportation Needs: No Transportation Needs (06/21/2024)   PRAPARE - Administrator, Civil Service (Medical): No    Lack of Transportation (Non-Medical): No  Physical Activity: Inactive (06/21/2024)   Exercise Vital Sign    Days of Exercise per Week: 0 days    Minutes of Exercise per Session: Not on file  Stress: No Stress Concern Present (06/21/2024)   Rachael Allen    Feeling of Stress: Only a little  Social Connections: Unknown (06/21/2024)   Social Connection and Isolation Panel    Frequency of Communication with Friends and Family: Patient declined    Frequency of Social Gatherings with Friends and Family: Patient declined    Attends Religious Services: Patient declined    Database administrator or Organizations: No    Attends Engineer, structural: Not on file    Marital Status: Married   Family History  Problem Relation Age of Onset   Breast cancer Mother 23  Hypertension Mother    Mental illness Father    Hypertension Sister         Objective:   Physical Exam Vitals reviewed.  Constitutional:      General: She is not in acute distress.    Appearance: She is well-developed. She is obese.  HENT:     Head: Normocephalic and atraumatic.  Eyes:     Pupils: Pupils are equal, round, and reactive to light.  Neck:     Thyroid : No thyromegaly.  Cardiovascular:     Rate and Rhythm: Normal rate and regular rhythm.     Heart sounds: Normal heart sounds. No murmur heard. Pulmonary:     Effort: Pulmonary effort is normal. No respiratory distress.     Breath sounds: Normal breath sounds. No  wheezing.  Abdominal:     General: Bowel sounds are normal. There is no distension.     Palpations: Abdomen is soft.     Tenderness: There is no abdominal tenderness.  Musculoskeletal:        General: No tenderness.     Cervical back: Normal range of motion and neck supple.     Comments: Pain in lumbar with flexion, pain in bilateral hips with internal or external rotation  Skin:    General: Skin is warm and dry.  Neurological:     Mental Status: She is alert and oriented to person, place, and time.     Cranial Nerves: No cranial nerve deficit.     Deep Tendon Reflexes: Reflexes are normal and symmetric.  Psychiatric:        Behavior: Behavior normal.        Thought Content: Thought content normal.        Judgment: Judgment normal.       BP 126/85   Pulse 64   Temp (!) 97.3 F (36.3 C) (Temporal)   Ht 5' 5 (1.651 m)   Wt 266 lb (120.7 kg)   BMI 44.26 kg/m      Assessment & Plan:  Rachael Allen comes in today with chief complaint of Back Pain (Both hips and legs x 2 weeks no injury )   Diagnosis and orders addressed:  1. Acute bilateral low back pain without sciatica (Primary) - baclofen  (LIORESAL ) 10 MG tablet; Take 1 tablet (10 mg total) by mouth 3 (three) times daily.  Dispense: 30 each; Refill: 0 - methylPREDNISolone  acetate (DEPO-MEDROL ) injection 80 mg - ketorolac  (TORADOL ) injection 60 mg  2. Bilateral hip pain - baclofen  (LIORESAL ) 10 MG tablet; Take 1 tablet (10 mg total) by mouth 3 (three) times daily.  Dispense: 30 each; Refill: 0 - methylPREDNISolone  acetate (DEPO-MEDROL ) injection 80 mg - ketorolac  (TORADOL ) injection 60 mg  3. Fibromyalgia muscle pain - baclofen  (LIORESAL ) 10 MG tablet; Take 1 tablet (10 mg total) by mouth 3 (three) times daily.  Dispense: 30 each; Refill: 0 - methylPREDNISolone  acetate (DEPO-MEDROL ) injection 80 mg - ketorolac  (TORADOL ) injection 60 mg   Will given Toradol  and Depo-Medrol  today.  Start baclofen  as needed.  Discussed sedation precautions.  Encouraged healthy diet and exercises  ROM exercises encouraged Keep follow up with PCP  Rachael Learn, FNP

## 2024-08-02 NOTE — Patient Instructions (Signed)
Myofascial Pain Syndrome and Fibromyalgia Myofascial pain syndrome and fibromyalgia are both pain disorders. You may feel this pain mainly in your muscles. Myofascial pain syndrome: Always has tender points in the muscles that will cause pain when pressed (trigger points). The pain may come and go. Usually affects your neck, upper back, and shoulder areas. The pain often moves into your arms and hands. Fibromyalgia: Has muscle pains and tenderness that come and go. Is often associated with tiredness (fatigue) and sleep problems. Has trigger points. Tends to be long-lasting (chronic), but is not life-threatening. Fibromyalgia and myofascial pain syndrome are not the same. However, they often occur together. If you have both conditions, each can make the other worse. Both are common and can cause enough pain and fatigue to make day-to-day activities difficult. Both can be hard to diagnose because their symptoms are common in many other conditions. What are the causes? The exact causes of these conditions are not known. What increases the risk? You are more likely to develop either of these conditions if: You have a family history of the condition. You are female. You have certain triggers, such as: Spine disorders. An injury (trauma) or other physical stressors. Being under a lot of stress. Medical conditions such as osteoarthritis, rheumatoid arthritis, or lupus. What are the signs or symptoms? Fibromyalgia The main symptom of fibromyalgia is widespread pain and tenderness in your muscles. Pain is sometimes described as stabbing, shooting, or burning. You may also have: Tingling or numbness. Sleep problems and fatigue. Problems with attention and concentration (fibro fog). Other symptoms may include: Bowel and bladder problems. Headaches. Vision problems. Sensitivity to odors and noises. Depression or mood changes. Painful menstrual periods (dysmenorrhea). Dry skin or eyes. These  symptoms can vary over time. Myofascial pain syndrome Symptoms of myofascial pain syndrome include: Tight, ropy bands of muscle. Uncomfortable sensations in muscle areas. These may include aching, cramping, burning, numbness, tingling, and weakness. Difficulty moving certain parts of the body freely (poor range of motion). How is this diagnosed? This condition may be diagnosed by your symptoms and medical history. You will also have a physical exam. In general: Fibromyalgia is diagnosed if you have pain, fatigue, and other symptoms for more than 3 months, and symptoms cannot be explained by another condition. Myofascial pain syndrome is diagnosed if you have trigger points in your muscles, and those trigger points are tender and cause pain elsewhere in your body (referred pain). How is this treated? Treatment for these conditions depends on the type that you have. For fibromyalgia, a healthy lifestyle is the most important treatment including aerobic and strength exercises. Different types of medicines are used to help treat pain and include: NSAIDs. Medicines for treating depression. Medicines that help control seizures. Medicines that relax the muscles. Treatment for myofascial pain syndrome includes: Pain medicines, such as NSAIDs. Cooling and stretching of muscles. Massage therapy with myofascial release technique. Trigger point injections. Treating these conditions often requires a team of health care providers. These may include: Your primary care provider. A physical therapist. Complementary health care providers, such as massage therapists or acupuncturists. A psychiatrist for cognitive behavioral therapy. Follow these instructions at home: Medicines Take over-the-counter and prescription medicines only as told by your health care provider. Ask your health care provider if the medicine prescribed to you: Requires you to avoid driving or using machinery. Can cause constipation.  You may need to take these actions to prevent or treat constipation: Drink enough fluid to keep your urine pale   yellow. Take over-the-counter or prescription medicines. Eat foods that are high in fiber, such as beans, whole grains, and fresh fruits and vegetables. Limit foods that are high in fat and processed sugars, such as fried or sweet foods. Lifestyle  Do exercises as told by your health care provider or physical therapist. Practice relaxation techniques to control your stress. You may want to try: Biofeedback. Visual imagery. Hypnosis. Muscle relaxation. Yoga. Meditation. Maintain a healthy lifestyle. This includes eating a healthy diet and getting enough sleep. Do not use any products that contain nicotine or tobacco. These products include cigarettes, chewing tobacco, and vaping devices, such as e-cigarettes. If you need help quitting, ask your health care provider. General instructions Talk to your health care provider about complementary treatments, such as acupuncture or massage. Do not do activities that stress or strain your muscles. This includes repetitive motions and heavy lifting. Keep all follow-up visits. This is important. Where to find support Consider joining a support group with others who are diagnosed with this condition. National Fibromyalgia Association: fmaware.org Where to find more information U.S. Pain Foundation: uspainfoundation.org Contact a health care provider if: You have new symptoms. Your symptoms get worse or your pain is severe. You have side effects from your medicines. You have trouble sleeping. Your condition is causing depression or anxiety. Get help right away if: You have thoughts of hurting yourself or others. Get help right away if you feel like you may hurt yourself or others, or have thoughts about taking your own life. Go to your nearest emergency room or: Call 911. Call the National Suicide Prevention Lifeline at 1-800-273-8255  or 988. This is open 24 hours a day. Text the Crisis Text Line at 741741. This information is not intended to replace advice given to you by your health care provider. Make sure you discuss any questions you have with your health care provider. Document Revised: 09/06/2022 Document Reviewed: 10/30/2021 Elsevier Patient Education  2024 Elsevier Inc.  

## 2024-08-07 ENCOUNTER — Encounter: Payer: Self-pay | Admitting: Podiatry

## 2024-08-07 ENCOUNTER — Ambulatory Visit (INDEPENDENT_AMBULATORY_CARE_PROVIDER_SITE_OTHER): Admitting: Podiatry

## 2024-08-07 DIAGNOSIS — D2372 Other benign neoplasm of skin of left lower limb, including hip: Secondary | ICD-10-CM

## 2024-08-07 DIAGNOSIS — D2371 Other benign neoplasm of skin of right lower limb, including hip: Secondary | ICD-10-CM | POA: Diagnosis not present

## 2024-08-07 NOTE — Progress Notes (Signed)
 She presents today chief complaint of painful calluses to the plantar medial aspect of the forefoot bilaterally.  She states that the left is worse than the right.  She is wondering if it is coming from her shoe gear.  Objective: Vital signs are stable she is alert and oriented x 3.  She has painful benign skin lesions no open lesions no bleeding no erythema cellulitis drainage or odor to the plantar medial aspect of the first metatarsal phalangeal joints bilaterally.  Assessment: Painful benign skin lesions bilateral.  Plan: Debridement of benign neoplasm skin of the bilateral

## 2024-08-20 ENCOUNTER — Other Ambulatory Visit: Payer: Self-pay | Admitting: Nurse Practitioner

## 2024-08-20 DIAGNOSIS — I10 Essential (primary) hypertension: Secondary | ICD-10-CM

## 2024-08-23 ENCOUNTER — Ambulatory Visit: Admitting: Podiatry

## 2024-09-06 ENCOUNTER — Ambulatory Visit (INDEPENDENT_AMBULATORY_CARE_PROVIDER_SITE_OTHER): Admitting: Nurse Practitioner

## 2024-09-06 ENCOUNTER — Encounter: Payer: Self-pay | Admitting: Nurse Practitioner

## 2024-09-06 VITALS — BP 110/77 | HR 65 | Temp 97.7°F | Ht 65.0 in | Wt 271.0 lb

## 2024-09-06 DIAGNOSIS — N9089 Other specified noninflammatory disorders of vulva and perineum: Secondary | ICD-10-CM | POA: Diagnosis not present

## 2024-09-06 NOTE — Progress Notes (Signed)
   Subjective:    Patient ID: Rachael Allen, female    DOB: 01-30-1971, 53 y.o.   MRN: [id number]   Chief Complaint: vulvar nodule  HPI Small nodule in vulvar area. Is non tender with no drainage Patient Active Problem List   Diagnosis Date Noted   Pain in left foot 02/11/2023   Lumbar radiculopathy 10/18/2022   Low back pain 09/02/2022   Mass of joint of right hip 07/28/2022   Primary hypertension 08/10/2021   Type 2 diabetes mellitus without complication, without long-term current use of insulin (HCC) 08/10/2021   Cervical stenosis of spine 07/03/2020   Diverticulosis 06/29/2017   Morbid obesity (HCC) 03/24/2016   OSA on CPAP 03/24/2016   Benign paroxysmal positional vertigo due to bilateral vestibular disorder 02/10/2016   Bipolar disorder (HCC) 01/26/2016   Depression 05/09/2014   Narcolepsy 05/09/2014   IUD (intrauterine device) in place 04/04/2014   Migraines 04/19/2013   Fibromyalgia muscle pain 03/07/2013   PCOS (polycystic ovarian syndrome) 03/07/2013   Fibrocystic breast 01/08/2013       Review of Systems  Constitutional:  Negative for diaphoresis.  Eyes:  Negative for pain.  Respiratory:  Negative for shortness of breath.   Cardiovascular:  Negative for chest pain, palpitations and leg swelling.  Gastrointestinal:  Negative for abdominal pain.  Endocrine: Negative for polydipsia.  Skin:  Negative for rash.  Neurological:  Negative for dizziness, weakness and headaches.  Hematological:  Does not bruise/bleed easily.  All other systems reviewed and are negative.      Objective:   Physical Exam Constitutional:      Appearance: Normal appearance.  Cardiovascular:     Rate and Rhythm: Normal rate and regular rhythm.     Heart sounds: Normal heart sounds.  Pulmonary:     Breath sounds: Normal breath sounds.  Genitourinary:    Comments: 2cm indurated nontender nodule left labia- no erythema edema or drainage Skin:    General: Skin is warm.   Neurological:     General: No focal deficit present.     Mental Status: She is alert and oriented to person, place, and time.  Psychiatric:        Mood and Affect: Mood normal.        Behavior: Behavior normal.    BP 110/77   Pulse 65   Temp 97.7 F (36.5 C) (Temporal)   Ht 5' 5 (1.651 m)   Wt 271 lb (122.9 kg)   SpO2 96%   BMI 45.10 kg/m         Assessment & Plan:  Rachael Allen in today with chief complaint of No chief complaint on file.   1. Vulvar lesion (Primary) Just watch for now- if any change in size, tenderness or drainage let me know    The above assessment and management plan was discussed with the patient. The patient verbalized understanding of and has agreed to the management plan. Patient is aware to call the clinic if symptoms persist or worsen. Patient is aware when to return to the clinic for a follow-up visit. Patient educated on when it is appropriate to go to the emergency department.   Mary-Margaret Gladis, FNP

## 2024-10-05 ENCOUNTER — Ambulatory Visit

## 2024-10-05 DIAGNOSIS — Z23 Encounter for immunization: Secondary | ICD-10-CM | POA: Diagnosis not present

## 2024-10-05 NOTE — Progress Notes (Signed)
 Patient is in office today for a nurse visit for Immunization. Patient Injection was given in the  Left deltoid. Patient tolerated injection well.

## 2024-11-27 ENCOUNTER — Ambulatory Visit: Admitting: Nurse Practitioner

## 2024-11-27 ENCOUNTER — Ambulatory Visit: Payer: Self-pay | Admitting: Nurse Practitioner

## 2024-11-27 ENCOUNTER — Ambulatory Visit

## 2024-11-27 ENCOUNTER — Encounter: Payer: Self-pay | Admitting: Nurse Practitioner

## 2024-11-27 VITALS — BP 127/85 | HR 67 | Ht 65.0 in | Wt 271.0 lb

## 2024-11-27 DIAGNOSIS — R0609 Other forms of dyspnea: Secondary | ICD-10-CM

## 2024-11-27 NOTE — Patient Instructions (Signed)
 Shortness of Breath, Adult Shortness of breath means you have trouble breathing. Shortness of breath could be a sign of a medical problem. Follow these instructions at home:  Pollution Do not smoke or use any products that contain nicotine or tobacco. If you need help quitting, ask your doctor. Avoid things that can make it harder to breathe, such as: Smoke of all kinds. This includes smoke from campfires or forest fires. Do not smoke or allow others to smoke in your home. Mold. Dust. Air pollution. Chemical smells. Things that can give you an allergic reaction (allergens) if you have allergies. Keep your living space clean. Use products that help remove mold and dust. General instructions Watch for any changes in your symptoms. Take over-the-counter and prescription medicines only as told by your doctor. This includes oxygen therapy and inhaled medicines. Rest as needed. Return to your normal activities when your doctor says that it is safe. Keep all follow-up visits. Contact a doctor if: Your condition does not get better as soon as expected. You have a hard time doing your normal activities, even after you rest. You have new symptoms. You cannot walk up stairs. You cannot exercise the way you normally do. Get help right away if: Your shortness of breath gets worse. You have trouble breathing when you are resting. You feel light-headed or you faint. You have a cough that is not helped by medicines. You cough up blood. You have pain with breathing. You have pain in your chest, arms, shoulders, or belly (abdomen). You have a fever. These symptoms may be an emergency. Get help right away. Call 911. Do not wait to see if the symptoms will go away. Do not drive yourself to the hospital. Summary Shortness of breath is when you have trouble breathing enough air. It can be a sign of a medical problem. Avoid things that make it hard for you to breathe, such as smoking, pollution,  mold, and dust. Watch for any changes in your symptoms. Contact your doctor if you do not get better or you get worse. This information is not intended to replace advice given to you by your health care provider. Make sure you discuss any questions you have with your health care provider. Document Revised: 07/18/2021 Document Reviewed: 07/18/2021 Elsevier Patient Education  2024 [employer].

## 2024-11-27 NOTE — Progress Notes (Signed)
 Subjective:    Patient ID: [name], female    DOB: 1971-10-26, 53 y.o.   MRN: [id number]   Chief Complaint: Gets exerted easily (With small tasks like taking out trash. Found nodules on lungs last year)   HPI  Patient says she has been getting SOB doing every day tasks. When she goes out with dog, or goes to mail box, she just gets SOB. Seems to be more noticeable the last few weeks. Denies any chest pain.  Patient Active Problem List   Diagnosis Date Noted   Pain in left foot 02/11/2023   Lumbar radiculopathy 10/18/2022   Low back pain 09/02/2022   Mass of joint of right hip 07/28/2022   Primary hypertension 08/10/2021   Type 2 diabetes mellitus without complication, without long-term current use of insulin (HCC) 08/10/2021   Cervical stenosis of spine 07/03/2020   Diverticulosis 06/29/2017   Morbid obesity (HCC) 03/24/2016   OSA on CPAP 03/24/2016   Benign paroxysmal positional vertigo due to bilateral vestibular disorder 02/10/2016   Bipolar disorder (HCC) 01/26/2016   Depression 05/09/2014   Narcolepsy 05/09/2014   IUD (intrauterine device) in place 04/04/2014   Migraines 04/19/2013   Fibromyalgia muscle pain 03/07/2013   PCOS (polycystic ovarian syndrome) 03/07/2013   Fibrocystic breast 01/08/2013       Review of Systems  Constitutional:  Negative for diaphoresis.  Eyes:  Negative for pain.  Respiratory:  Positive for shortness of breath.   Cardiovascular:  Negative for chest pain, palpitations and leg swelling.  Gastrointestinal:  Negative for abdominal pain.  Endocrine: Negative for polydipsia.  Skin:  Negative for rash.  Neurological:  Negative for dizziness, weakness and headaches.  Hematological:  Does not bruise/bleed easily.  All other systems reviewed and are negative.      Objective:   Physical Exam Constitutional:      Appearance: Normal appearance. She is obese.  Cardiovascular:     Rate and Rhythm: Normal rate and regular rhythm.      Heart sounds: Normal heart sounds.  Pulmonary:     Effort: Pulmonary effort is normal.     Breath sounds: Normal breath sounds.  Skin:    General: Skin is warm.  Neurological:     General: No focal deficit present.     Mental Status: She is alert and oriented to person, place, and time.  Psychiatric:        Mood and Affect: Mood normal.        Behavior: Behavior normal.    Chest xray- clear-Preliminary reading by Ronal Lunger, FNP  [facility]  EKG- NSR-Mary-Margaret Lunger, FNP   BP 127/85   Pulse 67   Ht 5' 5 (1.651 m)   Wt 271 lb (122.9 kg)   SpO2 96%   BMI 45.10 kg/m      Assessment & Plan:   [name] in today with chief complaint of Gets exerted easily (With small tasks like taking out trash. Found nodules on lungs last year)   1. Dyspnea on exertion (Primary) Need to rule out heart issues Then will do pulmonology work up - EKG 12-Lead - DG Chest 2 View - Ambulatory referral to Cardiology - Brain natriuretic peptide - CMP14+EGFR    The above assessment and management plan was discussed with the patient. The patient verbalized understanding of and has agreed to the management plan. Patient is aware to call the clinic if symptoms persist or worsen. Patient is aware when to return to the clinic  for a follow-up visit. Patient educated on when it is appropriate to go to the emergency department.   Mary-Margaret Gladis, FNP

## 2024-11-30 ENCOUNTER — Ambulatory Visit: Payer: Self-pay | Admitting: *Deleted

## 2024-11-30 NOTE — Telephone Encounter (Signed)
 NOTED. Appt scheduled 12/03/24. Recommended UC for worsening sx or fever.

## 2024-11-30 NOTE — Telephone Encounter (Signed)
 FYI Only or Action Required?: FYI only for provider: appointment scheduled on 12/03/24.  Patient was last seen in primary care on 11/27/2024 by Gladis Mustard, FNP.  Called Nurse Triage reporting Mass. Lump / swollen lymph nodes  Symptoms began several days ago. Wednesday night   Interventions attempted: OTC medications: ibuprofen .  Symptoms are: gradually worsening.  Triage Disposition: See Physician Within 24 Hours  Patient/caregiver understands and will follow disposition?: Yes   Please advise if appt today .                 Copied from CRM [id number]. Topic: Clinical - Red Word Triage >> Nov 30, 2024  9:44 AM Mercer PEDLAR wrote: Red Word that prompted transfer to Nurse Triage: Sore and swollen lymph nodes. Reason for Disposition  [1] Swelling is painful to touch AND [2] no fever  Answer Assessment - Initial Assessment Questions Appt scheduled 12/03/24. Recommended UC for worsening sx or fever.        1. APPEARANCE of SWELLING: What does it look like?     Lump right side of neck swelling  2. SIZE: How large is the swelling? (e.g., inches, cm; or compare to size of pinhead, tip of pen, eraser, coin, pea, grape, ping pong ball)      Golf ball  3. LOCATION: Where is the swelling located?     Right side of neck and left side swelling but not golf ball size 4. ONSET: When did the swelling start?     Wednesday night  5. COLOR: What color is it? Is there more than one color?     No color 6. PAIN: Is there any pain? If Yes, ask: How bad is the pain? (Scale 1-10; or mild, moderate, severe)       When touched 5/10 , with sleeping not able to lay on right side. Pulls with sitting up  7. ITCH: Does it itch? If Yes, ask: How bad is the itch?      Na  8. CAUSE: What do you think caused the swelling?     Not sure 9 OTHER SYMPTOMS: Do you have any other symptoms? (e.g., fever)      Headache, right neck swelling and left side  swelling  Protocols used: Skin Lump or Localized Swelling-A-AH

## 2024-12-03 ENCOUNTER — Ambulatory Visit: Admitting: Nurse Practitioner

## 2024-12-03 ENCOUNTER — Telehealth: Payer: Self-pay | Admitting: Nurse Practitioner

## 2024-12-03 ENCOUNTER — Encounter: Payer: Self-pay | Admitting: Nurse Practitioner

## 2024-12-03 NOTE — Telephone Encounter (Unsigned)
 Copied from CRM [id number]. Topic: Referral - Status >> Dec 03, 2024 11:58 AM Miquel SAILOR wrote: Reason for CRM: Ambulatory referral to Cardiology (Order [phone] requesting update on PEND  referral it is AUTH. Gave info then Transferred   Referral sent to: [facility] at [facility] [address] [place], [place] [address] [phone]

## 2024-12-07 ENCOUNTER — Ambulatory Visit: Admitting: Cardiology

## 2024-12-10 ENCOUNTER — Other Ambulatory Visit (HOSPITAL_COMMUNITY): Payer: Self-pay

## 2024-12-10 ENCOUNTER — Telehealth: Payer: Self-pay

## 2024-12-10 MED ORDER — TIRZEPATIDE-WEIGHT MANAGEMENT 2.5 MG/0.5ML ~~LOC~~ SOLN: 2.5000 mg | SUBCUTANEOUS | 1 refills | Status: DC

## 2024-12-10 NOTE — Telephone Encounter (Signed)
 Pharmacy Patient Advocate Encounter   Received notification from Onbase that prior authorization for Zepbound  2.5MG /0.5ML pen-injectors is required/requested.   Insurance verification completed.   The patient is insured through [employer].   Per test claim: PA required; PA submitted to above mentioned insurance via Latent Key/confirmation #/EOC [id number] Status is pending

## 2024-12-11 ENCOUNTER — Other Ambulatory Visit (HOSPITAL_COMMUNITY): Payer: Self-pay

## 2024-12-11 NOTE — Telephone Encounter (Signed)
 Pharmacy Patient Advocate Encounter  Received notification from [facility] [employer] that Prior Authorization for Zepbound  2.5MG /0.5ML pen-injectors  has been APPROVED from 11/10/24 to 08/07/25. Unable to obtain price due to refill too soon rejection, last fill date 12/11/24 next available fill date 01/02/25   PA #/Case ID/Reference #: [id number]

## 2024-12-28 ENCOUNTER — Encounter: Payer: Self-pay | Admitting: Nurse Practitioner

## 2024-12-28 ENCOUNTER — Ambulatory Visit: Payer: Self-pay | Admitting: Nurse Practitioner

## 2024-12-28 VITALS — BP 110/70 | HR 64 | Temp 97.9°F | Ht 65.0 in | Wt 280.0 lb

## 2024-12-28 DIAGNOSIS — F3160 Bipolar disorder, current episode mixed, unspecified: Secondary | ICD-10-CM

## 2024-12-28 DIAGNOSIS — I10 Essential (primary) hypertension: Secondary | ICD-10-CM | POA: Diagnosis not present

## 2024-12-28 DIAGNOSIS — F320 Major depressive disorder, single episode, mild: Secondary | ICD-10-CM

## 2024-12-28 DIAGNOSIS — M4802 Spinal stenosis, cervical region: Secondary | ICD-10-CM

## 2024-12-28 DIAGNOSIS — Z794 Long term (current) use of insulin: Secondary | ICD-10-CM | POA: Diagnosis not present

## 2024-12-28 DIAGNOSIS — M797 Fibromyalgia: Secondary | ICD-10-CM

## 2024-12-28 DIAGNOSIS — E1169 Type 2 diabetes mellitus with other specified complication: Secondary | ICD-10-CM

## 2024-12-28 DIAGNOSIS — K5790 Diverticulosis of intestine, part unspecified, without perforation or abscess without bleeding: Secondary | ICD-10-CM

## 2024-12-28 DIAGNOSIS — G43719 Chronic migraine without aura, intractable, without status migrainosus: Secondary | ICD-10-CM | POA: Diagnosis not present

## 2024-12-28 LAB — BAYER DCA HB A1C WAIVED: HB A1C (BAYER DCA - WAIVED): 5.6 % (ref 4.8–5.6)

## 2024-12-28 LAB — LIPID PANEL

## 2024-12-28 MED ORDER — VALACYCLOVIR HCL 1 G PO TABS: ORAL_TABLET | ORAL | 1 refills | Status: AC

## 2024-12-28 MED ORDER — LURASIDONE HCL 40 MG PO TABS: 40.0000 mg | ORAL_TABLET | Freq: Every day | ORAL | 1 refills | Status: AC

## 2024-12-28 MED ORDER — ZEPBOUND 5 MG/0.5ML ~~LOC~~ SOAJ: 5.0000 mg | SUBCUTANEOUS | 3 refills | Status: AC

## 2024-12-28 MED ORDER — LISINOPRIL 20 MG PO TABS: 20.0000 mg | ORAL_TABLET | Freq: Every day | ORAL | 1 refills | Status: AC

## 2024-12-28 NOTE — Progress Notes (Signed)
 "  Subjective:    Patient ID: Rachael Allen, female    DOB: [birth year], 53 y.o.   MRN: [id number]  Chief Complaint: medical management of chronic issues     HPI:  Rachael Allen is a 54 y.o. who identifies as a female who was assigned female at birth.   Social history: Lives with: husband Work history: disability   Comes in today for follow up of the following chronic medical issues:  1. Primary hypertension No c/o chest pain, sob or headache. Does not check blood pressure at home. BP Readings from Last 3 Encounters:  12/28/24 110/70  11/27/24 127/85  09/06/24 110/77     2. Intractable chronic migraine without aura and without status migrainosus Currently doing well. Has migraine 1x a quarter.  3. Diverticulosis Denies any recent flare ups  4. Diabetes type 2 with other specified complications Doe snot check blood sugar at home. Lab Results  Component Value Date   HGBA1C 5.2 06/28/2024    5. Recurrent major depressive disorder, in full remission (HCC) 6. Bipolar affective disorder, current episode mixed, current episode severity unspecified (HCC) Is on latuda  which is working well for her with no side effects.     12/28/2024   11:23 AM 09/06/2024    3:55 PM 07/17/2024   11:15 AM  Depression screen PHQ 2/9  Decreased Interest 0 1 1  Down, Depressed, Hopeless 1 0 0  PHQ - 2 Score 1 1 1   Altered sleeping 0    Tired, decreased energy 1    Change in appetite 0    Feeling bad or failure about yourself  0    Trouble concentrating 1    Moving slowly or fidgety/restless 0    Suicidal thoughts 0    PHQ-9 Score 3    Difficult doing work/chores Somewhat difficult        12/28/2024   11:23 AM 09/06/2024    3:55 PM 07/17/2024   11:15 AM 06/28/2024   12:24 PM  GAD 7 : Generalized Anxiety Score  Nervous, Anxious, on Edge 0 0 1 1  Control/stop worrying 1 1 1 1   Worry too much - different things 1 1 1 1   Trouble relaxing 0 0 0 0  Restless 0 0 0 0  Easily annoyed or  irritable 0 0 0 0  Afraid - awful might happen 1 0 0 0  Total GAD 7 Score 3 2 3 3   Anxiety Difficulty Somewhat difficult Somewhat difficult Somewhat difficult Somewhat difficult     7. Cervical stenosis of spine Has chronic back pain but is on no pain medication at this time  8. Fibromyalgia Has pain daily. Some days are better then others. Pain increases with to much activity. Rest helps. Rates pain 7/10 today.   9. Morbid obesity (HCC) Weight is up 9 lbs  Wt Readings from Last 3 Encounters:  12/28/24 280 lb (127 kg)  11/27/24 271 lb (122.9 kg)  09/06/24 271 lb (122.9 kg)   BMI Readings from Last 3 Encounters:  12/28/24 46.59 kg/m  11/27/24 45.10 kg/m  09/06/24 45.10 kg/m     New complaints: None today  Allergies  Allergen Reactions   Oxycodone -Acetaminophen  Itching    vomiting   Nitrofurantoin Other (See Comments)    SEVERE HEADACHE   Benadryl [Diphenhydramine Hcl]    Dilaudid [Hydromorphone Hcl]    Latex    Nsaids Nausea Only    In large doses   Vicodin [Hydrocodone-Acetaminophen ]    Outpatient Encounter  Medications as of 12/28/2024  Medication Sig   tirzepatide  (ZEPBOUND ) 5 MG/0.5ML Pen Inject 5 mg into the skin once a week.   [DISCONTINUED] lisinopril  (ZESTRIL ) 20 MG tablet TAKE 1 TABLET DAILY   [DISCONTINUED] lurasidone  (LATUDA ) 40 MG TABS tablet Take 1 tablet (40 mg total) by mouth daily with breakfast.   [DISCONTINUED] tirzepatide  (ZEPBOUND ) 2.5 MG/0.5ML injection vial Inject 2.5 mg into the skin once a week.   [DISCONTINUED] valACYclovir  (VALTREX ) 1000 MG tablet TAKE 2 TABLETS TWICE A DAY BY MOUTH FOR 1 DAY THEN AS NEEDED FOR FEVER BLISTERS   lisinopril  (ZESTRIL ) 20 MG tablet Take 1 tablet (20 mg total) by mouth daily.   lurasidone  (LATUDA ) 40 MG TABS tablet Take 1 tablet (40 mg total) by mouth daily with breakfast.   valACYclovir  (VALTREX ) 1000 MG tablet TAKE 2 TABLETS TWICE A DAY BY MOUTH FOR 1 DAY THEN AS NEEDED FOR FEVER BLISTERS   No  facility-administered encounter medications on file as of 12/28/2024.    Past Surgical History:  Procedure Laterality Date   CESAREAN SECTION     TONSILLECTOMY AND ADENOIDECTOMY      Family History  Problem Relation Age of Onset   Breast cancer Mother 20   Hypertension Mother    Mental illness Father    Hypertension Sister       Controlled substance contract: n/a     Review of Systems  Constitutional:  Negative for diaphoresis.  Eyes:  Negative for pain.  Respiratory:  Negative for shortness of breath.   Cardiovascular:  Negative for chest pain, palpitations and leg swelling.  Gastrointestinal:  Negative for abdominal pain.  Endocrine: Negative for polydipsia.  Skin:  Negative for rash.  Neurological:  Negative for dizziness, weakness and headaches.  Hematological:  Does not bruise/bleed easily.  All other systems reviewed and are negative.      Objective:   Physical Exam Vitals and nursing note reviewed.  Constitutional:      General: She is not in acute distress.    Appearance: Normal appearance. She is well-developed.  HENT:     Head: Normocephalic.     Right Ear: Tympanic membrane normal.     Left Ear: Tympanic membrane normal.     Nose: Nose normal.     Mouth/Throat:     Mouth: Mucous membranes are moist.  Eyes:     Pupils: Pupils are equal, round, and reactive to light.  Neck:     Vascular: No carotid bruit or JVD.  Cardiovascular:     Rate and Rhythm: Normal rate and regular rhythm.     Heart sounds: Normal heart sounds.  Pulmonary:     Effort: Pulmonary effort is normal. No respiratory distress.     Breath sounds: Normal breath sounds. No wheezing or rales.  Chest:     Chest wall: No tenderness.  Abdominal:     General: Bowel sounds are normal. There is no distension or abdominal bruit.     Palpations: Abdomen is soft. There is no hepatomegaly, splenomegaly, mass or pulsatile mass.     Tenderness: There is no abdominal tenderness.   Musculoskeletal:        General: Normal range of motion.     Cervical back: Normal range of motion and neck supple.  Lymphadenopathy:     Cervical: No cervical adenopathy.  Skin:    General: Skin is warm and dry.     Comments: 2cm sebaceous cyst left labia  Neurological:     Mental Status: She  is alert and oriented to person, place, and time.     Deep Tendon Reflexes: Reflexes are normal and symmetric.  Psychiatric:        Behavior: Behavior normal.        Thought Content: Thought content normal.        Judgment: Judgment normal.    BP 110/70   Pulse 64   Temp 97.9 F (36.6 C) (Temporal)   Ht 5' 5 (1.651 m)   Wt 280 lb (127 kg)   SpO2 98%   BMI 46.59 kg/m   Hgba1c 5.6%       Assessment & Plan:   Rachael Allen comes in today with chief complaint of medical management of chronic issues    Diagnosis and orders addressed:  1. Primary hypertension Low sodium diet - CBC with Differential/Platelet - CMP14+EGFR - lisinopril  (ZESTRIL ) 20 MG tablet; Take 1 tablet (20 mg total) by mouth daily.  Dispense: 90 tablet; Refill: 1 - CBC with Differential/Platelet - CMP14+EGFR - Lipid panel  2. Intractable chronic migraine without aura and without status migrainosus Avoid caffeine  3. Diverticulosis Watch diet to prevent flare up  4. Recurrent major depressive disorder, in full remission (HCC) Stress management  5. Bipolar affective disorder, current episode mixed, current episode severity unspecified (HCC) Continue with psych  6. Cervical stenosis of spine  7. Morbid obesity (HCC) Discussed diet and exercise for person with BMI >25 Will recheck weight in 3-6 months Still waiting on wegovy  prescription which has been on back order  8. Fibromyalgia muscle pain Moist heat Depomedrol 80mg  IM Toradol  60mg  IM - Bayer DCA Hb A1c Waived - Lipid panel  9. PCOS (polycystic ovarian syndrome) - metFORMIN  (GLUCOPHAGE -XR) 500 MG 24 hr tablet; Take 1 tablet (500 mg  total) by mouth 2 (two) times daily.  Dispense: 180 tablet; Refill: 1  10. Sebaceaous cyst left labia Soak in epsom salt  11. Urinary urgency Start myrbetriq   Labs pending Health Maintenance reviewed Diet and exercise encouraged  Follow up plan: 6 months   Rachael Gladis, FNP  "

## 2024-12-28 NOTE — Patient Instructions (Signed)
 Tirzepatide Injection (Weight Management) What is this medication? TIRZEPATIDE (tir ZEP a tide) promotes weight loss. It may also be used to maintain weight loss.  It works by decreasing appetite. It can be used to treat sleep apnea. Changes to diet and exercise are often combined with this medication. This medicine may be used for other purposes; ask your health care provider or pharmacist if you have questions. COMMON BRAND NAME(S): Zepbound What should I tell my care team before I take this medication? They need to know if you have any of these conditions: Diabetes Eye disease caused by diabetes Gallbladder disease Have or have had depression Have or have had pancreatitis Having surgery Kidney disease Personal or family history of MEN 2, a condition that causes endocrine gland tumors Personal or family history of thyroid  cancer Stomach or intestine problems, such as problems digesting food Suicidal thoughts, plans, or attempt An unusual or allergic reaction to tirzepatide, other medications, foods, dyes, or preservatives Pregnant or trying to get pregnant Breastfeeding How should I use this medication? This medication is injected under the skin. You will be taught how to prepare and give it. Take it as directed on the prescription label. Keep taking it unless your care team tells you to stop. It is important that you put your used needles and syringes in a special sharps container. Do not put them in a trash can. If you do not have a sharps container, call your pharmacist or care team to get one. A special MedGuide will be given to you by the pharmacist with each prescription and refill. Be sure to read this information carefully each time. This medication comes with INSTRUCTIONS FOR USE. Ask your pharmacist for directions on how to use this medication. Read the information carefully. Talk to your pharmacist or care team if you have questions. Talk to your care team about the use of this  medication in children. Special care may be needed. Overdosage: If you think you have taken too much of this medicine contact a poison control center or emergency room at once. NOTE: This medicine is only for you. Do not share this medicine with others. What if I miss a dose? If you miss a dose, take it as soon as you can unless it is more than 4 days (96 hours) late. If it is more than 4 days late, skip the missed dose. Take the next dose at the normal time. Do not take 2 doses within 3 days (72 hours) of each other. What may interact with this medication? Certain medications for diabetes, such as insulin , glyburide, glipizide This medication may affect how other medications work. Talk with your care team about all of the medications you take. They may suggest changes to your treatment plan to lower the risk of side effects and to make sure your medications work as intended. This list may not describe all possible interactions. Give your health care provider a list of all the medicines, herbs, non-prescription drugs, or dietary supplements you use. Also tell them if you smoke, drink alcohol, or use illegal drugs. Some items may interact with your medicine. What should I watch for while using this medication? Visit your care team for regular checks on your progress. Tell your care team if your condition does not start to get better or if it gets worse. Tell your care team if you are taking medication to treat diabetes, such as insulin  or glipizide. This may increase your risk of low blood sugar. Know the  symptoms of low blood sugar and how to treat it. Talk to your care team about your risk of cancer. You may be more at risk for certain types of cancer if you take this medication. Talk to your care team right away if you have a lump or swelling in your neck, hoarseness that does not go away, trouble swallowing, shortness of breath, or trouble breathing. Make sure you stay hydrated while taking this  medication. Drink water often. Eat fruits and veggies that have a high water content. Drink more water when it is hot or you are active. Talk to your care team right away if you have fever, infection, vomiting, diarrhea, or if you sweat a lot while taking this medication. The loss of too much body fluid may make it dangerous for you to take this medication. If you are going to need surgery or a procedure, tell your care team that you are taking this medication. Estrogen and progestin hormones that you take by mouth may not work as well while you are taking this medication. Switch to a non-oral contraceptive or add a barrier contraceptive for 4 weeks after starting this medication and after each dose increase. Talk to your care team about contraceptive options. They can help you find the option that works for you. Do not take this medication without first talking to your care team if you may be or could become pregnant. Your care team can help you find the option that works for you. Weight loss is not recommended during pregnancy. Talk to your care team if you are breastfeeding. When recommended, this medication may be taken. Its use during breastfeeding has not been well studied. Your care team may suggest other options. What side effects may I notice from receiving this medication? Side effects that you should report to your care team as soon as possible: Allergic reactions--skin rash, itching, hives, swelling of the face, lips, tongue, or throat Change in vision Dehydration--increased thirst, dry mouth, feeling faint or lightheaded, headache, dark yellow or brown urine Fast or irregular heartbeat Gallbladder problems--severe stomach pain, nausea, vomiting, fever Kidney injury--decrease in the amount of urine, swelling of the ankles, hands, or feet Pancreatitis--severe stomach pain that spreads to your back or gets worse after eating or when touched, fever, nausea, vomiting Thoughts of suicide or  self-harm, worsening mood, feelings of depression Thyroid  cancer--new mass or lump in the neck, pain or trouble swallowing, trouble breathing, hoarseness Side effects that usually do not require medical attention (report these to your care team if they continue or are bothersome): Constipation Diarrhea Loss of appetite Nausea Upset stomach This list may not describe all possible side effects. Call your doctor for medical advice about side effects. You may report side effects to FDA at 1-800-FDA-1088. Where should I keep my medication? Keep out of the reach of children and pets. Store in a refrigerator or at room temperature up to 30 degrees C (86 degrees F). Keep it in the original container. Protect from light. Refrigeration (preferred): Store in the refrigerator. Do not freeze. Get rid of any unused medication after the expiration date. Room temperature: This medication may be stored at room temperature for up to 21 days. If it is stored at room temperature, get rid of any unused medication after 21 days or after it expires, whichever is first. To get rid of medications that are no longer needed or have expired: Take the medication to a medication take-back program. Check with your pharmacy or law enforcement  to find a location. If you cannot return the medication, ask your pharmacist or care team how to get rid of this medication safely. NOTE: This sheet is a summary. It may not cover all possible information. If you have questions about this medicine, talk to your doctor, pharmacist, or health care provider.  2025 Elsevier/Gold Standard (2023-12-27 00:00:00)

## 2024-12-29 LAB — CBC WITH DIFFERENTIAL/PLATELET
Basophils Absolute: 0.1 x10E3/uL (ref 0.0–0.2)
Basos: 1 %
EOS (ABSOLUTE): 0.3 x10E3/uL (ref 0.0–0.4)
Eos: 4 %
Hematocrit: 42.3 % (ref 34.0–46.6)
Hemoglobin: 13.9 g/dL (ref 11.1–15.9)
Immature Grans (Abs): 0 x10E3/uL (ref 0.0–0.1)
Immature Granulocytes: 0 %
Lymphocytes Absolute: 1.9 x10E3/uL (ref 0.7–3.1)
Lymphs: 26 %
MCH: 27.7 pg (ref 26.6–33.0)
MCHC: 32.9 g/dL (ref 31.5–35.7)
MCV: 84 fL (ref 79–97)
Monocytes Absolute: 0.4 x10E3/uL (ref 0.1–0.9)
Monocytes: 6 %
Neutrophils Absolute: 4.4 x10E3/uL (ref 1.4–7.0)
Neutrophils: 63 %
Platelets: 285 x10E3/uL (ref 150–450)
RBC: 5.02 x10E6/uL (ref 3.77–5.28)
RDW: 14.3 % (ref 11.7–15.4)
WBC: 7.1 x10E3/uL (ref 3.4–10.8)

## 2024-12-29 LAB — CMP14+EGFR
ALT: 19 IU/L (ref 0–32)
AST: 13 IU/L (ref 0–40)
Albumin: 4.5 g/dL (ref 3.8–4.9)
Alkaline Phosphatase: 57 IU/L (ref 49–135)
BUN/Creatinine Ratio: 9 (ref 9–23)
BUN: 8 mg/dL (ref 6–24)
Bilirubin Total: 0.4 mg/dL (ref 0.0–1.2)
CO2: 23 mmol/L (ref 20–29)
Calcium: 9.3 mg/dL (ref 8.7–10.2)
Chloride: 102 mmol/L (ref 96–106)
Creatinine, Ser: 0.88 mg/dL (ref 0.57–1.00)
Globulin, Total: 2.4 g/dL (ref 1.5–4.5)
Glucose: 103 mg/dL — ABNORMAL HIGH (ref 70–99)
Potassium: 4.2 mmol/L (ref 3.5–5.2)
Sodium: 139 mmol/L (ref 134–144)
Total Protein: 6.9 g/dL (ref 6.0–8.5)
eGFR: 78 mL/min/1.73

## 2024-12-29 LAB — LIPID PANEL
Chol/HDL Ratio: 4.9 ratio — ABNORMAL HIGH (ref 0.0–4.4)
Cholesterol, Total: 163 mg/dL (ref 100–199)
HDL: 33 mg/dL — ABNORMAL LOW
LDL Chol Calc (NIH): 105 mg/dL — ABNORMAL HIGH (ref 0–99)
Triglycerides: 139 mg/dL (ref 0–149)
VLDL Cholesterol Cal: 25 mg/dL (ref 5–40)

## 2024-12-31 ENCOUNTER — Ambulatory Visit: Attending: Cardiology | Admitting: Cardiology

## 2024-12-31 ENCOUNTER — Ambulatory Visit: Payer: Self-pay | Admitting: Nurse Practitioner

## 2024-12-31 ENCOUNTER — Encounter: Payer: Self-pay | Admitting: Cardiology

## 2024-12-31 VITALS — BP 120/79 | HR 70 | Ht 65.0 in | Wt 281.1 lb

## 2024-12-31 DIAGNOSIS — I10 Essential (primary) hypertension: Secondary | ICD-10-CM

## 2024-12-31 DIAGNOSIS — R0609 Other forms of dyspnea: Secondary | ICD-10-CM | POA: Diagnosis not present

## 2024-12-31 NOTE — Progress Notes (Signed)
 "     Clinical Summary Rachael Allen is a 54 y.o.female seen today as a new consult, referred by NP Gladis for the following medical problems.   1.DOE - seen by [facility] cardiology in 2014 -stress echo 2014 [facility] negative for ischemia  - symptoms started about 6 months ago - occurs with activity. For example taking the dog out, showering, walking distances. DOE with housework like sweeping - no chest pains - no LE edema - no cough, no wheezing. Remote 4 year smoking history, 2nd hand from her mother -    2. OSA - intolerant to cpap Past Medical History:  Diagnosis Date   Bipolar disorder (HCC)    Depression    Diverticulitis      Allergies[1]   Current Outpatient Medications  Medication Sig Dispense Refill   lisinopril  (ZESTRIL ) 20 MG tablet Take 1 tablet (20 mg total) by mouth daily. 90 tablet 1   lurasidone  (LATUDA ) 40 MG TABS tablet Take 1 tablet (40 mg total) by mouth daily with breakfast. 90 tablet 1   tirzepatide  (ZEPBOUND ) 5 MG/0.5ML Pen Inject 5 mg into the skin once a week. 2 mL 3   valACYclovir  (VALTREX ) 1000 MG tablet TAKE 2 TABLETS TWICE A DAY BY MOUTH FOR 1 DAY THEN AS NEEDED FOR FEVER BLISTERS 12 tablet 1   No current facility-administered medications for this visit.     Past Surgical History:  Procedure Laterality Date   CESAREAN SECTION     TONSILLECTOMY AND ADENOIDECTOMY       Allergies[2]    Family History  Problem Relation Age of Onset   Breast cancer Mother 29   Hypertension Mother    Mental illness Father    Hypertension Sister      Social History Rachael Allen reports that she has never smoked. She has never used smokeless tobacco. Rachael Allen reports no history of alcohol use.    Physical Examination Today's Vitals   12/31/24 1310  BP: 120/79  Pulse: 70  SpO2: 97%  Weight: 281 lb 1.6 oz (127.5 kg)  Height: 5' 5 (1.651 m)   Body mass index is 46.78 kg/m.  Gen: resting comfortably, no acute distress HEENT: no scleral  icterus, pupils equal round and reactive, no palptable cervical adenopathy,  CV: RRR, no m/rg, no vjd Resp: Clear to auscultation bilaterally GI: abdomen is soft, non-tender, non-distended, normal bowel sounds, no hepatosplenomegaly MSK: extremities are warm, no edema.  Skin: warm, no rash Neuro:  no focal deficits Psych: appropriate affect    Assessment and Plan  1.DOE - would start workup with echo - pending results, likely PFTs. Risk for restrictive lung disease given BMI, 2nd hand smoke exposure from her mom - lower suspiction of ischemia at this time, would not plan for stress testing at this time however potentially reconsider after initial work   F/u 3 months      Rachael Allen, M.D.     [1]  Allergies Allergen Reactions   Oxycodone -Acetaminophen  Itching    vomiting   Nitrofurantoin Other (See Comments)    SEVERE HEADACHE   Benadryl [Diphenhydramine Hcl]    Dilaudid [Hydromorphone Hcl]    Latex    Nsaids Nausea Only    In large doses   Vicodin [Hydrocodone-Acetaminophen ]   [2]  Allergies Allergen Reactions   Oxycodone -Acetaminophen  Itching    vomiting   Nitrofurantoin Other (See Comments)    SEVERE HEADACHE   Benadryl [Diphenhydramine Hcl]    Dilaudid [Hydromorphone Hcl]    Latex  Nsaids Nausea Only    In large doses   Vicodin [Hydrocodone-Acetaminophen ]    "

## 2024-12-31 NOTE — Patient Instructions (Signed)
 Medication Instructions:  Your physician recommends that you continue on your current medications as directed. Please refer to the Current Medication list given to you today.  *If you need a refill on your cardiac medications before your next appointment, please call your pharmacy*  Lab Work: None If you have labs (blood work) drawn today and your tests are completely normal, you will receive your results only by: MyChart Message (if you have MyChart) OR A paper copy in the mail If you have any lab test that is abnormal or we need to change your treatment, we will call you to review the results.  Testing/Procedures: Your physician has requested that you have an echocardiogram. Echocardiography is a painless test that uses sound waves to create images of your heart. It provides your doctor with information about the size and shape of your heart and how well your hearts chambers and valves are working. This procedure takes approximately one hour. There are no restrictions for this procedure. Please do NOT wear cologne, perfume, aftershave, or lotions (deodorant is allowed). Please arrive 15 minutes prior to your appointment time.  Please note: We ask at that you not bring children with you during ultrasound (echo/ vascular) testing. Due to room size and safety concerns, children are not allowed in the ultrasound rooms during exams. Our front office staff cannot provide observation of children in our lobby area while testing is being conducted. An adult accompanying a patient to their appointment will only be allowed in the ultrasound room at the discretion of the ultrasound technician under special circumstances. We apologize for any inconvenience.   Follow-Up: At [facility], you and your health needs are our priority.  As part of our continuing mission to provide you with exceptional heart care, our providers are all part of one team.  This team includes your primary Cardiologist  (physician) and Advanced Practice Providers or APPs (Physician Assistants and Nurse Practitioners) who all work together to provide you with the care you need, when you need it.  Your next appointment:   3 month(s)  Provider:   You will see one of the following Advanced Practice Providers on your designated Care Team:   Brittany Strader, PA-C  [place], [place] Olivia [place], [place]       We recommend signing up for the patient portal called MyChart.  Sign up information is provided on this After Visit Summary.  MyChart is used to connect with patients for Virtual Visits (Telemedicine).  Patients are able to view lab/test results, encounter notes, upcoming appointments, etc.  Non-urgent messages can be sent to your provider as well.   To learn more about what you can do with MyChart, go to [email or web address].   Other Instructions

## 2025-01-04 ENCOUNTER — Ambulatory Visit (HOSPITAL_COMMUNITY)
Admission: RE | Admit: 2025-01-04 | Discharge: 2025-01-04 | Disposition: A | Source: Ambulatory Visit | Attending: Cardiology

## 2025-01-04 DIAGNOSIS — R0609 Other forms of dyspnea: Secondary | ICD-10-CM | POA: Diagnosis present

## 2025-01-04 LAB — ECHOCARDIOGRAM COMPLETE
AR max vel: 2.24 cm2
AV Area VTI: 2.48 cm2
AV Area mean vel: 2.26 cm2
AV Mean grad: 4.4 mmHg
AV Peak grad: 9.2 mmHg
Ao pk vel: 1.51 m/s
Area-P 1/2: 3.37 cm2
S' Lateral: 2.7 cm

## 2025-01-04 NOTE — Progress Notes (Signed)
*  PRELIMINARY RESULTS* Echocardiogram 2D Echocardiogram has been performed.  Rachael Allen 01/04/2025, 2:40 PM

## 2025-01-08 ENCOUNTER — Ambulatory Visit: Payer: Self-pay | Admitting: Cardiology

## 2025-01-08 ENCOUNTER — Telehealth: Payer: Self-pay | Admitting: Cardiology

## 2025-01-08 ENCOUNTER — Other Ambulatory Visit: Payer: Self-pay | Admitting: Nurse Practitioner

## 2025-01-08 DIAGNOSIS — J4521 Mild intermittent asthma with (acute) exacerbation: Secondary | ICD-10-CM

## 2025-01-08 DIAGNOSIS — R0602 Shortness of breath: Secondary | ICD-10-CM

## 2025-01-08 NOTE — Telephone Encounter (Signed)
 The patient has been notified of the result and verbalized understanding.  All questions (if any) were answered. Rosina JAYSON Cornea, CMA 01/08/2025 4:40 PM

## 2025-01-08 NOTE — Telephone Encounter (Signed)
Patient is calling in about her results. Please advise

## 2025-01-08 NOTE — Telephone Encounter (Signed)
-----   Message from Dorn Ross, MD sent at 01/08/2025  4:35 PM EST ----- Echo shows normal heart pumping function. Overall no significant abnormal findings, can we order PFTs for SOB please  JINNY Ross MD

## 2025-01-14 ENCOUNTER — Telehealth: Payer: Self-pay | Admitting: Cardiology

## 2025-01-14 NOTE — Telephone Encounter (Signed)
 Needs to scheduled echo

## 2025-01-14 NOTE — Telephone Encounter (Signed)
 Patient is calling about getting schd for a test she is suppose to have. Please advise

## 2025-01-15 ENCOUNTER — Ambulatory Visit (INDEPENDENT_AMBULATORY_CARE_PROVIDER_SITE_OTHER): Admitting: Nurse Practitioner

## 2025-01-15 ENCOUNTER — Ambulatory Visit: Admitting: Nurse Practitioner

## 2025-01-15 ENCOUNTER — Ambulatory Visit: Payer: Self-pay

## 2025-01-15 ENCOUNTER — Encounter: Payer: Self-pay | Admitting: Nurse Practitioner

## 2025-01-15 VITALS — BP 124/79 | HR 72 | Temp 97.6°F | Ht 65.0 in | Wt 281.0 lb

## 2025-01-15 DIAGNOSIS — R052 Subacute cough: Secondary | ICD-10-CM | POA: Diagnosis not present

## 2025-01-15 MED ORDER — PROMETHAZINE-DM 6.25-15 MG/5ML PO SYRP: 5.0000 mL | ORAL_SOLUTION | Freq: Four times a day (QID) | ORAL | 0 refills | Status: AC | PRN

## 2025-01-15 MED ORDER — PREDNISONE 20 MG PO TABS: 40.0000 mg | ORAL_TABLET | Freq: Every day | ORAL | 0 refills | Status: AC

## 2025-01-15 NOTE — Progress Notes (Signed)
 "  Subjective:    Patient ID: Rachael Allen, female    DOB: [birth year], 54 y.o.   MRN: [id number]   Chief Complaint: Cough and Wheezing   Cough This is a new problem. The current episode started in the past 7 days. The problem has been gradually worsening. The problem occurs every few minutes. The cough is Non-productive. Associated symptoms include shortness of breath (on exertion) and wheezing. Pertinent negatives include no ear congestion or ear pain. She has tried OTC cough suppressant for the symptoms. The treatment provided no relief.  Wheezing  This is a new problem. The current episode started in the past 7 days. The problem occurs constantly. The problem has been gradually worsening. Associated symptoms include coughing and shortness of breath (on exertion). Pertinent negatives include no ear pain.   Is seeing Dr. Alvan and he is getting er scheduled for pulmonary Function test.  Patient Active Problem List   Diagnosis Date Noted   Pain in left foot 02/11/2023   Lumbar radiculopathy 10/18/2022   Low back pain 09/02/2022   Primary hypertension 08/10/2021   Type 2 diabetes mellitus with other specified complication (HCC) 08/10/2021   Cervical stenosis of spine 07/03/2020   Diverticulosis 06/29/2017   Morbid obesity (HCC) 03/24/2016   OSA on CPAP 03/24/2016   Benign paroxysmal positional vertigo due to bilateral vestibular disorder 02/10/2016   Bipolar disorder (HCC) 01/26/2016   Major depressive disorder, single episode, mild 05/09/2014   Narcolepsy 05/09/2014   IUD (intrauterine device) in place 04/04/2014   Migraines 04/19/2013   Fibromyalgia muscle pain 03/07/2013   PCOS (polycystic ovarian syndrome) 03/07/2013   Fibrocystic breast 01/08/2013       Review of Systems  HENT:  Negative for ear pain.   Respiratory:  Positive for cough, shortness of breath (on exertion) and wheezing.        Objective:   Physical Exam Constitutional:      Appearance: Normal  appearance.  Cardiovascular:     Rate and Rhythm: Normal rate and regular rhythm.     Heart sounds: Normal heart sounds.  Pulmonary:     Effort: Pulmonary effort is normal.     Breath sounds: Normal breath sounds. No wheezing.  Skin:    General: Skin is warm.  Neurological:     General: No focal deficit present.     Mental Status: She is alert and oriented to person, place, and time.  Psychiatric:        Mood and Affect: Mood normal.        Behavior: Behavior normal.       BP 124/79   Pulse 72   Temp 97.6 F (36.4 C) (Temporal)   Ht 5' 5 (1.651 m)   Wt 281 lb (127.5 kg)   SpO2 97%   BMI 46.76 kg/m      Assessment & Plan:  Rachael Allen in today with chief complaint of Cough and Wheezing   1. Subacute cough (Primary) 1. Take meds as prescribed 2. Use a cool mist humidifier especially during the winter months and when heat has been humid. 3. Use saline nose sprays frequently 4. Saline irrigations of the nose can be very helpful if done frequently.  * 4X daily for 1 week*  * Use of a nettie pot can be helpful with this. Follow directions with this* 5. Drink plenty of fluids 6. Keep thermostat turn down low 7.For any cough or congestion- promethazine  DM 8. For fever or aces or pains-  take tylenol  or ibuprofen appropriate for age and weight.  * for fevers greater than 101 orally you may alternate ibuprofen and tylenol  every  3 hours.    Meds ordered this encounter  Medications   predniSONE  (DELTASONE ) 20 MG tablet    Sig: Take 2 tablets (40 mg total) by mouth daily with breakfast for 5 days. 2 po daily for 5 days    Dispense:  10 tablet    Refill:  0    Supervising Provider:   DETTINGER, JOSHUA A [[id number]]   promethazine -dextromethorphan (PROMETHAZINE -DM) 6.25-15 MG/5ML syrup    Sig: Take 5 mLs by mouth 4 (four) times daily as needed.    Dispense:  118 mL    Refill:  0    Supervising Provider:   MARYANNE CHEW A [[id number]]       The above assessment  and management plan was discussed with the patient. The patient verbalized understanding of and has agreed to the management plan. Patient is aware to call the clinic if symptoms persist or worsen. Patient is aware when to return to the clinic for a follow-up visit. Patient educated on when it is appropriate to go to the emergency department.   Mary-Margaret Gladis, FNP    "

## 2025-01-15 NOTE — Patient Instructions (Signed)
 Cough in Adults: What It Means A cough helps to clear your throat and lungs. It may be a sign of an illness or another condition. A short-term (acute) cough may last 2-3 weeks. A long-term (chronic) cough may last 8 or more weeks. Many things can cause a cough. They include: Illnesses such as: An infection in your throat or lungs. Asthma or other heart or lung problems. Gastroesophageal reflux. This is when acid comes back up from your stomach. Breathing in things that bother (irritate) your lungs. Allergies. Postnasal drip. This is when mucus runs down the back of your throat. Smoking. Some medicines. Follow these instructions at home: Medicines Take over-the-counter and prescription medicines only as told by your doctor. Talk with your doctor before you take cough medicine (cough suppressants). Eating and drinking Do not drink alcohol. Do not drink caffeine. Drink enough fluid to keep your pee (urine) pale yellow. Lifestyle Stay away from cigarette smoke. Do not smoke or use any products that contain nicotine or tobacco. If you need help quitting, ask your doctor. Stay away from things that make you cough. These may include perfume, candles, cleaning products, or campfire smoke. General instructions  Watch for any changes to your cough. Tell your doctor about them. Always cover your mouth when you cough. If the air is dry in your home, use a cool mist vaporizer or humidifier. If your cough is worse at night, try using extra pillows to raise your head up higher while you sleep. Rest as needed. Contact a doctor if: You have new symptoms. Your symptoms get worse. You cough up pus. You have a fever that does not go away. Your cough does not get better after 2-3 weeks. Cough medicine does not help, and you are not sleeping well. You have pain that gets worse or is not helped with medicine. You are losing weight and do not know why. You have night sweats. Get help right away  if: You cough up blood. You have trouble breathing. Your heart is beating very fast. These symptoms may be an emergency. Get help right away. Call 911. Do not wait to see if the symptoms will go away. Do not drive yourself to the hospital. This information is not intended to replace advice given to you by your health care provider. Make sure you discuss any questions you have with your health care provider. Document Revised: 10/05/2024 Document Reviewed: 07/30/2022 Elsevier Patient Education  2025 [employer].

## 2025-01-15 NOTE — Telephone Encounter (Signed)
 FYI Only or Action Required?: FYI only for provider: appointment scheduled on 01/15/25.  Patient was last seen in primary care on 12/28/2024 by Gladis Mustard, FNP.  Called Nurse Triage reporting Cough.  Symptoms began several days ago.  Interventions attempted: Rest, hydration, or home remedies.  Symptoms are: gradually worsening.  Triage Disposition: See HCP Within 4 Hours (Or PCP Triage)  Patient/caregiver understands and will follow disposition?: Yes  Reason for Disposition  [1] MILD difficulty breathing (e.g., minimal/no SOB at rest, SOB with walking, pulse < 100) AND [2] still present when not coughing  Wheezing is present  Answer Assessment - Initial Assessment Questions 1. ONSET: When did the cough begin?      3 days 2. SEVERITY: How bad is the cough today?      Keeping her up at night 3. SPUTUM: Describe the color of your sputum (e.g., none, dry cough; clear, white, yellow, green)     Nonproductive 4. HEMOPTYSIS: Are you coughing up any blood? If Yes, ask: How much? (e.g., flecks, streaks, tablespoons, etc.)     Nonproductive 5. DIFFICULTY BREATHING: Are you having difficulty breathing? If Yes, ask: How bad is it? (e.g., mild, moderate, severe)      With exertion 6. FEVER: Do you have a fever? If Yes, ask: What is your temperature, how was it measured, and when did it start?     Denies  Protocols used: Cough - Acute Non-Productive-A-AH

## 2025-01-15 NOTE — Telephone Encounter (Signed)
 Apt scheduled.

## 2025-01-21 ENCOUNTER — Inpatient Hospital Stay: Admission: RE | Admit: 2025-01-21 | Source: Ambulatory Visit

## 2025-01-22 ENCOUNTER — Encounter (HOSPITAL_COMMUNITY)

## 2025-02-01 ENCOUNTER — Ambulatory Visit: Admitting: Nurse Practitioner

## 2025-04-01 ENCOUNTER — Ambulatory Visit: Admitting: Physician Assistant

## 2025-06-24 ENCOUNTER — Ambulatory Visit: Admitting: Nurse Practitioner
